# Patient Record
Sex: Male | Born: 1950 | Race: White | Hispanic: No | Marital: Married | Smoking: Former smoker
Health system: Southern US, Community
[De-identification: ages and names within clinical notes are randomized; demographics above are authoritative.]

## PROBLEM LIST (undated history)

## (undated) DIAGNOSIS — I10 Essential (primary) hypertension: Secondary | ICD-10-CM

## (undated) DIAGNOSIS — I639 Cerebral infarction, unspecified: Secondary | ICD-10-CM

## (undated) DIAGNOSIS — K219 Gastro-esophageal reflux disease without esophagitis: Secondary | ICD-10-CM

## (undated) DIAGNOSIS — T7840XA Allergy, unspecified, initial encounter: Secondary | ICD-10-CM

## (undated) DIAGNOSIS — E785 Hyperlipidemia, unspecified: Secondary | ICD-10-CM

## (undated) DIAGNOSIS — G4730 Sleep apnea, unspecified: Secondary | ICD-10-CM

## (undated) HISTORY — DX: Cerebral infarction, unspecified: I63.9

## (undated) HISTORY — DX: Sleep apnea, unspecified: G47.30

## (undated) HISTORY — DX: Essential (primary) hypertension: I10

## (undated) HISTORY — DX: Allergy, unspecified, initial encounter: T78.40XA

## (undated) HISTORY — DX: Hyperlipidemia, unspecified: E78.5

## (undated) HISTORY — PX: APPENDECTOMY: SHX54

## (undated) HISTORY — DX: Gastro-esophageal reflux disease without esophagitis: K21.9

---

## 1991-11-01 DIAGNOSIS — I639 Cerebral infarction, unspecified: Secondary | ICD-10-CM

## 1991-11-01 HISTORY — DX: Cerebral infarction, unspecified: I63.9

## 2012-05-18 ENCOUNTER — Ambulatory Visit (INDEPENDENT_AMBULATORY_CARE_PROVIDER_SITE_OTHER): Payer: 59 | Admitting: Family Medicine

## 2012-05-18 ENCOUNTER — Encounter: Payer: Self-pay | Admitting: Family Medicine

## 2012-05-18 VITALS — BP 137/70 | HR 80 | Temp 97.3°F | Resp 16 | Ht 70.0 in | Wt 273.0 lb

## 2012-05-18 DIAGNOSIS — Z720 Tobacco use: Secondary | ICD-10-CM

## 2012-05-18 DIAGNOSIS — IMO0001 Reserved for inherently not codable concepts without codable children: Secondary | ICD-10-CM

## 2012-05-18 DIAGNOSIS — E785 Hyperlipidemia, unspecified: Secondary | ICD-10-CM

## 2012-05-18 DIAGNOSIS — R21 Rash and other nonspecific skin eruption: Secondary | ICD-10-CM

## 2012-05-18 DIAGNOSIS — R609 Edema, unspecified: Secondary | ICD-10-CM

## 2012-05-18 LAB — COMPREHENSIVE METABOLIC PANEL
AST: 29 U/L (ref 0–37)
Albumin: 4.2 g/dL (ref 3.5–5.2)
Alkaline Phosphatase: 85 U/L (ref 39–117)
Chloride: 99 mEq/L (ref 96–112)
Potassium: 4.4 mEq/L (ref 3.5–5.3)
Sodium: 134 mEq/L — ABNORMAL LOW (ref 135–145)
Total Protein: 6.8 g/dL (ref 6.0–8.3)

## 2012-05-18 LAB — TSH: TSH: 1.724 u[IU]/mL (ref 0.350–4.500)

## 2012-05-18 LAB — LIPID PANEL: LDL Cholesterol: 31 mg/dL (ref 0–99)

## 2012-05-18 MED ORDER — VARDENAFIL HCL 20 MG PO TABS: 20.0000 mg | ORAL_TABLET | Freq: Every day | ORAL | Status: DC | PRN

## 2012-05-18 MED ORDER — MOMETASONE FUROATE 50 MCG/ACT NA SUSP: 2.0000 | Freq: Every day | NASAL | Status: DC

## 2012-05-18 NOTE — Patient Instructions (Addendum)

## 2012-05-19 LAB — MICROALBUMIN / CREATININE URINE RATIO
Creatinine, Urine: 54.3 mg/dL
Microalb, Ur: 0.65 mg/dL (ref 0.00–1.89)

## 2012-05-19 LAB — VITAMIN D 25 HYDROXY (VIT D DEFICIENCY, FRACTURES): Vit D, 25-Hydroxy: 33 ng/mL (ref 30–89)

## 2012-05-23 ENCOUNTER — Encounter: Payer: Self-pay | Admitting: Family Medicine

## 2012-05-23 DIAGNOSIS — E1165 Type 2 diabetes mellitus with hyperglycemia: Secondary | ICD-10-CM | POA: Insufficient documentation

## 2012-05-23 DIAGNOSIS — E785 Hyperlipidemia, unspecified: Secondary | ICD-10-CM | POA: Insufficient documentation

## 2012-05-23 DIAGNOSIS — F32A Depression, unspecified: Secondary | ICD-10-CM | POA: Insufficient documentation

## 2012-05-23 DIAGNOSIS — IMO0001 Reserved for inherently not codable concepts without codable children: Secondary | ICD-10-CM | POA: Insufficient documentation

## 2012-05-23 DIAGNOSIS — F329 Major depressive disorder, single episode, unspecified: Secondary | ICD-10-CM | POA: Insufficient documentation

## 2012-05-23 DIAGNOSIS — Z8673 Personal history of transient ischemic attack (TIA), and cerebral infarction without residual deficits: Secondary | ICD-10-CM | POA: Insufficient documentation

## 2012-05-23 DIAGNOSIS — Z720 Tobacco use: Secondary | ICD-10-CM | POA: Insufficient documentation

## 2012-05-23 NOTE — Progress Notes (Signed)
Quick Note:  Please call pt and advise that the following labs are abnormal... Your labs show an elevated blood sugar but this was expected. Your kidney function and liver tests are very good. Thyroid test (TSH) is normal.  Lipid panel show cholesterol is in good range on Atorvastatin. Triglycerides are 2 times normal. I want you to really work on improving your nutrition and following the meal plan you were given. Try to get a little exercise most days of the week. Vitamin D level is in the low-normal range; I would like to see it around 50. Get over-the-counter Vitamin D3 2000 IU and take 1 capsule daily. This will improve your metabolism and maybe help with weight loss.  Copy to pt. ______

## 2012-05-23 NOTE — Progress Notes (Signed)
Subjective:    Patient ID: Drew Salinas, male    DOB: 08/19/1951, 61 y.o.   MRN: [id number]  HPI   This 61 y.o. Cauc male is new to [facility] and is here with wife Drew Salinas) who has established care here.  He has Type II DM diagnosed in 1995 and does not follow a meal plan. He misses Insulin doses 2-4x/week  (Levemir 100 units bid, Apidra before meals); also takes Metformin. A1c= 8.4% in August 2012.   Exercise is limited.   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    He also has HTN (on Amlodipine 5 mg) and dyslipidemia (on Atorvastatin 10 mg).   He uses Levitra for ED. Sleep disorder/ depression is treated with Bupropion and Trazodone; he also   uses OTC Melatonin.    Review of Systems  Constitutional: Positive for fatigue. Negative for fever, diaphoresis, activity change and appetite change.  HENT: Positive for congestion, rhinorrhea and sneezing.        Has A.R.- takes Claritin, Mucinex- DM and uses Nasonex   Respiratory: Positive for cough. Negative for chest tightness, shortness of breath and wheezing.   Cardiovascular: Negative for chest pain, palpitations and leg swelling.  Genitourinary: Negative.   Musculoskeletal: Negative.   Neurological: Negative for dizziness, syncope, speech difficulty, weakness, light-headedness, numbness and headaches.  Psychiatric/Behavioral: Positive for disturbed wake/sleep cycle and dysphoric mood. Negative for suicidal ideas, confusion and decreased concentration. The patient is not nervous/anxious.        Objective:   Physical Exam  Nursing note and vitals reviewed. Constitutional: He is oriented to person, place, and time. He appears well-developed and well-nourished. No distress.  HENT:  Head: Normocephalic and atraumatic.  Right Ear: External ear normal.  Left Ear: External ear normal.  Nose: Nose normal.  Mouth/Throat: Oropharynx is clear and moist.  Eyes: Conjunctivae and EOM are normal.  No scleral icterus.  Neck: Normal range of motion. Neck supple. No thyromegaly present.  Cardiovascular: Normal rate, regular rhythm and normal heart sounds.  Exam reveals no gallop.   No murmur heard. Pulmonary/Chest: Effort normal and breath sounds normal. No respiratory distress. He has no wheezes.       BS distant in lower lobes  Musculoskeletal: Normal range of motion. He exhibits edema. He exhibits no tenderness.       Trace pedal/pre-tibial edema  Lymphadenopathy:    He has no cervical adenopathy.  Neurological: He is alert and oriented to person, place, and time. No cranial nerve deficit. Coordination normal.  Skin: Skin is warm and dry. Rash noted.       Right lower leg-anterior shin: erythematous slightly raised papules (uniform size), no scaliness or ulcerative lesions  Psychiatric: He has a normal mood and affect. His behavior is normal. Thought content normal.    A1C= 8.8%      Assessment & Plan:   1. Type II or unspecified type diabetes mellitus without mention of complication, uncontrolled  POCT glycosylated hemoglobin (Hb A1C) = 8.8% Microalbumin, urine, Microalbumin/Creatinine Ratio, Urine, Comprehensive metabolic panel  Pt encouraged to adhere to better nutrition; Diabetic meal planning guide given to pt and briefly reviewed with him and his wife  2. Hyperlipidemia  Comprehensive metabolic panel, Lipid panel  3. Rash - pt has been using OTC topical which has helped Continue to use topical sparingly until rash resolves  4. Edema  Vitamin D, 25-hydroxy, TSH   RFs: Nasonex NS and Levitra only meds  needed at this time. All other medications are to be continued at current doses until follow-up.

## 2012-05-24 ENCOUNTER — Encounter: Payer: Self-pay | Admitting: Family Medicine

## 2012-06-19 ENCOUNTER — Other Ambulatory Visit: Payer: Self-pay | Admitting: Family Medicine

## 2012-08-07 ENCOUNTER — Other Ambulatory Visit: Payer: Self-pay | Admitting: Family Medicine

## 2012-08-09 ENCOUNTER — Telehealth: Payer: Self-pay | Admitting: Radiology

## 2012-08-09 MED ORDER — METFORMIN HCL 500 MG PO TABS: 500.0000 mg | ORAL_TABLET | Freq: Two times a day (BID) | ORAL | Status: DC

## 2012-08-09 NOTE — Telephone Encounter (Signed)
Patients wife called about the patients medications, they have now been called in since pharmacy did not get the electronic Rx.

## 2012-08-17 ENCOUNTER — Encounter: Payer: Self-pay | Admitting: Family Medicine

## 2012-08-17 ENCOUNTER — Ambulatory Visit (INDEPENDENT_AMBULATORY_CARE_PROVIDER_SITE_OTHER): Payer: 59 | Admitting: Family Medicine

## 2012-08-17 VITALS — BP 154/66 | HR 71 | Temp 98.3°F | Resp 16 | Ht 73.0 in | Wt 271.3 lb

## 2012-08-17 DIAGNOSIS — J309 Allergic rhinitis, unspecified: Secondary | ICD-10-CM

## 2012-08-17 DIAGNOSIS — Z23 Encounter for immunization: Secondary | ICD-10-CM

## 2012-08-17 DIAGNOSIS — Z760 Encounter for issue of repeat prescription: Secondary | ICD-10-CM

## 2012-08-17 DIAGNOSIS — E781 Pure hyperglyceridemia: Secondary | ICD-10-CM

## 2012-08-17 DIAGNOSIS — IMO0001 Reserved for inherently not codable concepts without codable children: Secondary | ICD-10-CM

## 2012-08-17 LAB — LIPID PANEL: LDL Cholesterol: 62 mg/dL (ref 0–99)

## 2012-08-17 LAB — BASIC METABOLIC PANEL
BUN: 10 mg/dL (ref 6–23)
CO2: 26 mEq/L (ref 19–32)
Chloride: 105 mEq/L (ref 96–112)
Creat: 0.85 mg/dL (ref 0.50–1.35)

## 2012-08-17 MED ORDER — INSULIN GLULISINE 100 UNIT/ML ~~LOC~~ SOLN: SUBCUTANEOUS | Status: DC

## 2012-08-17 MED ORDER — TRAZODONE HCL 100 MG PO TABS: 100.0000 mg | ORAL_TABLET | Freq: Every day | ORAL | Status: DC

## 2012-08-17 MED ORDER — VARDENAFIL HCL 20 MG PO TABS: 20.0000 mg | ORAL_TABLET | Freq: Every day | ORAL | Status: DC | PRN

## 2012-08-17 MED ORDER — METFORMIN HCL 500 MG PO TABS: 500.0000 mg | ORAL_TABLET | Freq: Two times a day (BID) | ORAL | Status: DC

## 2012-08-17 MED ORDER — BUPROPION HCL ER (XL) 300 MG PO TB24: 300.0000 mg | ORAL_TABLET | Freq: Every day | ORAL | Status: DC

## 2012-08-17 MED ORDER — ATORVASTATIN CALCIUM 10 MG PO TABS: ORAL_TABLET | ORAL | Status: DC

## 2012-08-17 MED ORDER — AMLODIPINE BESYLATE 5 MG PO TABS: 5.0000 mg | ORAL_TABLET | Freq: Every day | ORAL | Status: DC

## 2012-08-17 MED ORDER — INSULIN DETEMIR 100 UNIT/ML ~~LOC~~ SOLN: 100.0000 [IU] | Freq: Two times a day (BID) | SUBCUTANEOUS | Status: DC

## 2012-08-17 MED ORDER — CLOPIDOGREL BISULFATE 75 MG PO TABS: 75.0000 mg | ORAL_TABLET | Freq: Every day | ORAL | Status: DC

## 2012-08-17 NOTE — Progress Notes (Signed)
  Subjective:    Patient ID: Drew Salinas, male    DOB: 1950/12/03, 61 y.o.   MRN: [id number]  HPI   This 61 y.o. Cauc male has Type II DM and is in Insulin; he uses a sliding scale devised by his  previous PCP in [place]. FSBS: fasting ~150 and before PM meal 160-190. He denies hypoglycemia.  He has been compliant with all medications and denies side effects.      He requests Flu vaccine but having some mild URI symptoms for 3-4 days. He is afebrile and  has no cough.      Review of Systems  Constitutional: Negative for fever, chills, diaphoresis, appetite change and fatigue.  HENT: Positive for congestion, sore throat, rhinorrhea, sneezing and postnasal drip. Negative for trouble swallowing and sinus pressure.   Eyes: Positive for redness and itching. Negative for discharge and visual disturbance.  Respiratory: Negative for cough, chest tightness and shortness of breath.   Cardiovascular: Negative for chest pain, palpitations and leg swelling.  Gastrointestinal: Negative for nausea, vomiting and diarrhea.  Neurological: Negative.        Objective:   Physical Exam  Nursing note and vitals reviewed. Constitutional: He is oriented to person, place, and time. He appears well-developed and well-nourished. No distress.  HENT:  Head: Normocephalic and atraumatic.  Right Ear: External ear normal.  Left Ear: External ear normal.  Nose: No rhinorrhea, nasal deformity or septal deviation.  Mouth/Throat: Oropharynx is clear and moist.       Nasal mucosal erythema  Eyes: Conjunctivae normal and EOM are normal. Pupils are equal, round, and reactive to light. Right eye exhibits no discharge. Left eye exhibits no discharge. No scleral icterus.  Neck: Normal range of motion. Neck supple. No thyromegaly present.  Cardiovascular: Normal rate, regular rhythm and intact distal pulses.  Exam reveals no gallop.   No murmur heard. Pulmonary/Chest: Effort normal and breath sounds normal. No respiratory  distress.  Musculoskeletal: He exhibits no edema.  Lymphadenopathy:    He has no cervical adenopathy.  Neurological: He is alert and oriented to person, place, and time. He has normal reflexes. No cranial nerve deficit.  Skin: Skin is warm and dry. Rash noted.       Bilateral feet: (Plantar aspect)- red rash with scaliness.  Psychiatric: He has a normal mood and affect. His behavior is normal.    A1C= 8.7 %      Assessment & Plan:   1. Type II or unspecified type diabetes mellitus without mention of complication, uncontrolled  Basic metabolic panel Increase Metformin to 1000 mg bid (pt to take 500 mg 2 tabs twice a day) Encouraged pt to get Insulin ASAP when finances allow  2. High triglycerides - expect improvement with DM control Lipid panel  3. Need for prophylactic vaccination and inoculation against influenza  Flu vaccine greater than or equal to 3yo preservative free IM   Letter provided for pt to return to work without restrictions.

## 2012-08-17 NOTE — Patient Instructions (Addendum)
Your need to work on better control of blood sugars. Adjust your short-acting Insulin a little and really focus on better nutrition (smaller portions, less carbs and concentrated sugars, drinking more water, etc.)   Diabetes Meal Planning Guide The diabetes meal planning guide is a tool to help you plan your meals and snacks. It is important for people with diabetes to manage their blood glucose (sugar) levels. Choosing the right foods and the right amounts throughout your day will help control your blood glucose. Eating right can even help you improve your blood pressure and reach or maintain a healthy weight. CARBOHYDRATE COUNTING MADE EASY When you eat carbohydrates, they turn to sugar. This raises your blood glucose level. Counting carbohydrates can help you control this level so you feel better. When you plan your meals by counting carbohydrates, you can have more flexibility in what you eat and balance your medicine with your food intake. Carbohydrate counting simply means adding up the total amount of carbohydrate grams in your meals and snacks. Try to eat about the same amount at each meal. Foods with carbohydrates are listed below. Each portion below is 1 carbohydrate serving or 15 grams of carbohydrates. Ask your dietician how many grams of carbohydrates you should eat at each meal or snack. Grains and Starches  1 slice bread.   English muffin or hotdog/hamburger bun.   cup cold cereal (unsweetened).   cup cooked pasta or rice.   cup starchy vegetables (corn, potatoes, peas, beans, winter squash).  1 tortilla (6 inches).   bagel.  1 waffle or pancake (size of a CD).   cup cooked cereal.  4 to 6 small crackers. *Whole grain is recommended. Fruit  1 cup fresh unsweetened berries, melon, papaya, pineapple.  1 small fresh fruit.   banana or mango.   cup fruit juice (4 oz unsweetened).   cup canned fruit in natural juice or water.  2 tbs dried fruit.  12 to 15  grapes or cherries. Milk and Yogurt  1 cup fat-free or 1% milk.  1 cup soy milk.  6 oz light yogurt with sugar-free sweetener.  6 oz low-fat soy yogurt.  6 oz plain yogurt. Vegetables  1 cup raw or  cup cooked is counted as 0 carbohydrates or a "free" food.  If you eat 3 or more servings at 1 meal, count them as 1 carbohydrate serving. Other Carbohydrates   oz chips or pretzels.   cup ice cream or frozen yogurt.   cup sherbet or sorbet.  2 inch square cake, no frosting.  1 tbs honey, sugar, jam, jelly, or syrup.  2 small cookies.  3 squares of graham crackers.  3 cups popcorn.  6 crackers.  1 cup broth-based soup.  Count 1 cup casserole or other mixed foods as 2 carbohydrate servings.  Foods with less than 20 calories in a serving may be counted as 0 carbohydrates or a "free" food. You may want to purchase a book or computer software that lists the carbohydrate gram counts of different foods. In addition, the nutrition facts panel on the labels of the foods you eat are a good source of this information. The label will tell you how big the serving size is and the total number of carbohydrate grams you will be eating per serving. Divide this number by 15 to obtain the number of carbohydrate servings in a portion. Remember, 1 carbohydrate serving equals 15 grams of carbohydrate. SERVING SIZES Measuring foods and serving sizes helps you make  sure you are getting the right amount of food. The list below tells how big or small some common serving sizes are.  1 oz.........4 stacked dice.  3 oz........Marland KitchenDeck of cards.  1 tsp.......Marland KitchenTip of little finger.  1 tbs......Marland KitchenMarland KitchenThumb.  2 tbs.......Marland KitchenGolf ball.   cup......Marland KitchenHalf of a fist.  1 cup.......Marland KitchenA fist. SAMPLE DIABETES MEAL PLAN Below is a sample meal plan that includes foods from the grain and starches, dairy, vegetable, fruit, and meat groups. A dietician can individualize a meal plan to fit your calorie needs and  tell you the number of servings needed from each food group. However, controlling the total amount of carbohydrates in your meal or snack is more important than making sure you include all of the food groups at every meal. You may interchange carbohydrate containing foods (dairy, starches, and fruits). The meal plan below is an example of a 2000 calorie diet using carbohydrate counting. This meal plan has 17 carbohydrate servings. Breakfast  1 cup oatmeal (2 carb servings).   cup light yogurt (1 carb serving).  1 cup blueberries (1 carb serving).   cup almonds. Snack  1 large apple (2 carb servings).  1 low-fat string cheese stick. Lunch  Chicken breast salad.  1 cup spinach.   cup chopped tomatoes.  2 oz chicken breast, sliced.  2 tbs low-fat [place] dressing.  12 whole-wheat crackers (2 carb servings).  12 to 15 grapes (1 carb serving).  1 cup low-fat milk (1 carb serving). Snack  1 cup carrots.   cup hummus (1 carb serving). Dinner  3 oz broiled salmon.  1 cup brown rice (3 carb servings). Snack  1  cups steamed broccoli (1 carb serving) drizzled with 1 tsp olive oil and lemon juice.  1 cup light pudding (2 carb servings). DIABETES MEAL PLANNING WORKSHEET Your dietician can use this worksheet to help you decide how many servings of foods and what types of foods are right for you.  BREAKFAST Food Group and Servings / Carb Servings Grain/Starches __________________________________ Dairy __________________________________________ Vegetable ______________________________________ Fruit ___________________________________________ Meat __________________________________________ Fat ____________________________________________ LUNCH Food Group and Servings / Carb Servings Grain/Starches ___________________________________ Dairy ___________________________________________ Fruit ____________________________________________ Meat  ___________________________________________ Fat _____________________________________________ Laural Golden Food Group and Servings / Carb Servings Grain/Starches ___________________________________ Dairy ___________________________________________ Fruit ____________________________________________ Meat ___________________________________________ Fat _____________________________________________ SNACKS Food Group and Servings / Carb Servings Grain/Starches ___________________________________ Dairy ___________________________________________ Vegetable _______________________________________ Fruit ____________________________________________ Meat ___________________________________________ Fat _____________________________________________ DAILY TOTALS Starches _________________________ Vegetable ________________________ Fruit ____________________________ Dairy ____________________________ Meat ____________________________ Fat ______________________________ Document Released: 07/14/2005 Document Revised: 01/09/2012 Document Reviewed: 05/25/2009 Exit[employer] Patient Information 2013 [place], [place].

## 2012-08-21 ENCOUNTER — Encounter: Payer: Self-pay | Admitting: Family Medicine

## 2012-08-21 NOTE — Progress Notes (Signed)
Quick Note:  Please call pt and advise that the following labs are abnormal... Blood sugar is elevated as expected and triglycerides are still about 100 points too high. This value is almost 100 points better than 3 months ago. Continue to work on nutrition and taking your medications as directed.   Contact the office if you have any questions or concerns.  Copy to pt. ______

## 2012-09-11 ENCOUNTER — Other Ambulatory Visit: Payer: Self-pay | Admitting: Physician Assistant

## 2012-11-16 ENCOUNTER — Encounter: Payer: Self-pay | Admitting: Family Medicine

## 2012-11-16 ENCOUNTER — Ambulatory Visit (INDEPENDENT_AMBULATORY_CARE_PROVIDER_SITE_OTHER): Payer: 59 | Admitting: Family Medicine

## 2012-11-16 VITALS — BP 130/80 | HR 75 | Temp 98.9°F | Resp 16 | Ht 70.5 in | Wt 267.6 lb

## 2012-11-16 DIAGNOSIS — IMO0001 Reserved for inherently not codable concepts without codable children: Secondary | ICD-10-CM

## 2012-11-16 MED ORDER — METFORMIN HCL 500 MG PO TABS: ORAL_TABLET | ORAL | Status: DC

## 2012-11-16 MED ORDER — SITAGLIPTIN PHOS-METFORMIN HCL 50-1000 MG PO TABS: 1.0000 | ORAL_TABLET | Freq: Two times a day (BID) | ORAL | Status: DC

## 2012-11-16 NOTE — Progress Notes (Addendum)
S:  This 62 y.o. Cauc male is here for DM follow-up. FSBS have been "high"- > 150. He has over-indulged during the holidays but has managed to lose weight. He feels well and denies any medication side effects. Checks feet daily and denies pruritus, pain or numbness.   ROS: Negative for fatigue, unexplained weight change, CP or tightness, palpitations, SOB or DOE, myalgias, HA, dizziness, weakness or syncope.  O:  Filed Vitals:   11/16/12 0752  BP: 130/80  Pulse: 75  Temp: 98.9 F (37.2 C)  Resp: 16   GEN: In NAD; WN,WD. HENT: [place]/AT; EOMI w/ clear conj/sclerae. Unremarkable. COR: RRR. No edema. LUNGS: Normal resp rate and effort. SKIN: W&D; Feet- scaling w/ redness; no ulcerations. Pulses- DP 2+. See DM foot exam. NEURO: A&O x 3; CNs intact. Nonfocal.  Results for orders placed in visit on 11/16/12  POCT GLYCOSYLATED HEMOGLOBIN (HGB A1C)      Component Value Range   Hemoglobin A1C 8.7       A/P:  1. Type II or unspecified type diabetes mellitus without mention of complication, uncontrolled  Encouraged pt to improve nutrition and increase physical activity. Continue Levemir and Apidra doses. Change oral medication to Janumet 50/1000 mg 1 tab bid w/ meals (can finish current supply of Metformin)   2.  Pt encouraged to continue weight loss w/ better nutrition.  Given RX: Zostavax to prevent Shingles.

## 2012-11-16 NOTE — Patient Instructions (Signed)
Medication change- New medication is Janumet (2 medications in one tablet). You will take 1 tablet twice a day with meals. Work on better nutrition and continued weight loss. Check blood sugars as you have been doing.

## 2013-02-13 ENCOUNTER — Encounter: Payer: Self-pay | Admitting: Family Medicine

## 2013-02-13 ENCOUNTER — Ambulatory Visit (INDEPENDENT_AMBULATORY_CARE_PROVIDER_SITE_OTHER): Payer: PRIVATE HEALTH INSURANCE | Admitting: Family Medicine

## 2013-02-13 ENCOUNTER — Telehealth: Payer: Self-pay

## 2013-02-13 VITALS — BP 144/70 | HR 76 | Temp 98.0°F | Resp 16 | Ht 70.5 in | Wt 270.4 lb

## 2013-02-13 DIAGNOSIS — E669 Obesity, unspecified: Secondary | ICD-10-CM

## 2013-02-13 DIAGNOSIS — G4733 Obstructive sleep apnea (adult) (pediatric): Secondary | ICD-10-CM

## 2013-02-13 DIAGNOSIS — I10 Essential (primary) hypertension: Secondary | ICD-10-CM

## 2013-02-13 DIAGNOSIS — E785 Hyperlipidemia, unspecified: Secondary | ICD-10-CM

## 2013-02-13 DIAGNOSIS — IMO0001 Reserved for inherently not codable concepts without codable children: Secondary | ICD-10-CM

## 2013-02-13 LAB — POCT GLYCOSYLATED HEMOGLOBIN (HGB A1C): Hemoglobin A1C: 8.1

## 2013-02-13 LAB — COMPREHENSIVE METABOLIC PANEL
BUN: 11 mg/dL (ref 6–23)
CO2: 24 mEq/L (ref 19–32)
Calcium: 9.2 mg/dL (ref 8.4–10.5)
Chloride: 102 mEq/L (ref 96–112)
Creat: 0.98 mg/dL (ref 0.50–1.35)
Glucose, Bld: 214 mg/dL — ABNORMAL HIGH (ref 70–99)

## 2013-02-13 LAB — LIPID PANEL
Cholesterol: 125 mg/dL (ref 0–200)
HDL: 45 mg/dL (ref >39)
Total CHOL/HDL Ratio: 2.8 Ratio

## 2013-02-13 NOTE — Patient Instructions (Addendum)
For improved Diabetes control- work on making those small changes that we discussed. I am not going to change any medications at this time. Reducing your calorie intake and staying active are key to getting your A1c down to 7%.

## 2013-02-13 NOTE — Telephone Encounter (Signed)
Patient needs a new sleep machine.  Forgot to talk with Dr. Audria Nine this morning about this.  Machine he has is five years or older and cuts off on him.   CBN:  [phone]  [phone]  Dairl Ponder phone

## 2013-02-13 NOTE — Telephone Encounter (Signed)
Pended order, sleep study done 2007, unsure if this is too old for him to get new supplies, I have faxed the old sleep study to you along with his demographics and insurance card and [facility] order. If you agree with him getting new machine sign order in computer, sign it by hand when it prints, and sign [facility] order as I have faxed it to you. Hopefully if we fax old sleep study and new order for supplies, he can get the supplies, the fax number for [employer] is [phone], if you need me to do anything else please let me know. Abriel Hattery

## 2013-02-13 NOTE — Progress Notes (Signed)
S:  This 62 y.o. Cauc male returns for DM and HTN follow-up. He has no complaints; FSBS= 100-220. Pt admits that he could  Reduce bread intake as well as chips and other snacks. He is employed full-time and is active in the job. Pt denies skipping medications doses and has no adverse effects. Family plans to travel this weekend to  [place] for family gathering and event at horse track.  HCM: No eye exam in last  12 months.  ROS: Negative for fatigue, abnormal weight change, diaphoresis, CP or tightness, palpitations, SOB or DOE, cough, HA, dizziness, numbness, weakness or syncope.  OCeasar Mons Vitals:   02/13/13 0749  BP: 144/70                                        Weight down ~1.5 pounds since Oct  2013  Pulse: 76  Temp: 98 F (36.7 C)  Resp: 16   GEN: In NAD; WN,WD. Pt is obese but also has a lot of muscle weight. HENT: [place]/AT; EOMI w/ clear conj/ sclerae. EACs/nose normal. Oroph clear and moist. COR: RRR. LUNGS: Normal resp rate and effort. SKIN: W&D. No pallor or erythema. MS; MAEs; no deformities, c/c/e. NEURO: A&O x 3; CNs intact. Nonfocal.   Results for orders placed in visit on 02/13/13  POCT GLYCOSYLATED HEMOGLOBIN (HGB A1C)      Result Value Range   Hemoglobin A1C 8.1      LABS:  October 2013    Total Chol= 157  TGs= 241  HDL= 47  LDL= 62   CHD risk=3.3   A/P: Type II or unspecified type diabetes mellitus without mention of complication, uncontrolled - discussed nutrition changes that pt needs to consider (Carbs reduction) . No medication changes at this time. Plan: POCT glycosylated hemoglobin (Hb A1C), Comprehensive metabolic panel  Other and unspecified hyperlipidemia   Plan: Lipid panel; continue current medications.  HTN (hypertension)- stable; encourage weight reduction. Continue current medications.  Obesity, unspecified- weight loss goal is 5-8 pounds by next visit.

## 2013-02-14 NOTE — Telephone Encounter (Signed)
I think pt needs a new Sleep Study,given that it has been 61/2 years since last study I am going to order a new study He was diagnosed with Moderately Severe Obstructive Sleep Apnea. Please notify pt and find out if he has any scheduling preferences. Thank you.

## 2013-02-14 NOTE — Telephone Encounter (Signed)
Called him. Spoke to his wife.

## 2013-02-14 NOTE — Progress Notes (Signed)
Quick Note:  Please contact pt and advise that the following labs are abnormal...  Labs are normal (kidney and liver tests) except blood sugar is elevated. Cholesterol profile shows steady improvement : Triglycerides down from 241 to 203. LDL down to 39. HDL still above 39. Heart disease risk is less than average. Continue current medications.  Copy to pt. ______

## 2013-02-21 ENCOUNTER — Other Ambulatory Visit: Payer: Self-pay | Admitting: Family Medicine

## 2013-03-13 ENCOUNTER — Other Ambulatory Visit: Payer: Self-pay | Admitting: Family Medicine

## 2013-03-20 ENCOUNTER — Other Ambulatory Visit: Payer: Self-pay | Admitting: Family Medicine

## 2013-04-12 ENCOUNTER — Telehealth: Payer: Self-pay

## 2013-04-12 NOTE — Telephone Encounter (Signed)
Wants to know if sleep study is back and checking on medication insulin glulisine (APIDRA) 100 UNIT/ML injection The directions do not match what he is currently taking.   [phone]

## 2013-04-13 ENCOUNTER — Telehealth: Payer: Self-pay | Admitting: Physician Assistant

## 2013-04-13 MED ORDER — INSULIN GLULISINE 100 UNIT/ML ~~LOC~~ SOCT: 20.0000 [IU] | Freq: Four times a day (QID) | SUBCUTANEOUS | Status: DC

## 2013-04-13 NOTE — Telephone Encounter (Signed)
I have re-ordered Apidra with correct instructions for use. As of today, I have not seen a report re: Sleep Study.

## 2013-04-13 NOTE — Telephone Encounter (Signed)
Pt has been using Apidra 20-30 units 4 times a day, this has not changed since his original visit. Prescription only directs 3 times a day up to 15units -  He will need his prescription corrected as he is about to run out. His CBG's have been running around 112 in the morning.    Pt had sleep study two weeks ago- would like to know results and get a new breathing machine. Have we received any reports on this?

## 2013-04-13 NOTE — Telephone Encounter (Signed)
Pharmacy called to state they have never heard of an Apidra [employer]. Ok to change to [employer]. Same sig. #5 pens. RF 3.

## 2013-04-14 NOTE — Telephone Encounter (Signed)
Pt aware insulin rx has been resent.

## 2013-04-15 ENCOUNTER — Telehealth: Payer: Self-pay | Admitting: Radiology

## 2013-04-15 NOTE — Telephone Encounter (Signed)
Would like to know results of sleep study. I contacted the office and left a message to call us back and fax results. Will forward to [facility] as [employer]

## 2013-04-16 NOTE — Telephone Encounter (Signed)
I spoke with pt's wife, Elita Quick, after reviewing the Sleep Study results.Pt has long hx of OSA and went for study because of need for new CPAP. 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 The pt is supposed to return for second study, per wife; I informed her that the study does indicate that he is supposed to return 1 month from initial study date (03/29/2013). The results have been faxed to [facility]; wife given phone numbers to follow-up on repeat study and DME. I advised that the pt take his current apparatus w/ him to follow-up study so that the sleep center can make sure that new CPAP is ordered. Wife understands and requests copy of study be sent to their home.

## 2013-04-25 ENCOUNTER — Encounter: Payer: Self-pay | Admitting: Family Medicine

## 2013-05-14 ENCOUNTER — Ambulatory Visit (INDEPENDENT_AMBULATORY_CARE_PROVIDER_SITE_OTHER): Payer: PRIVATE HEALTH INSURANCE | Admitting: Emergency Medicine

## 2013-05-14 VITALS — BP 142/82 | HR 83 | Temp 98.0°F | Resp 17 | Ht 70.5 in | Wt 270.0 lb

## 2013-05-14 DIAGNOSIS — IMO0001 Reserved for inherently not codable concepts without codable children: Secondary | ICD-10-CM

## 2013-05-14 DIAGNOSIS — B372 Candidiasis of skin and nail: Secondary | ICD-10-CM

## 2013-05-14 MED ORDER — FLUCONAZOLE 100 MG PO TABS: ORAL_TABLET | ORAL | Status: DC

## 2013-05-14 NOTE — Progress Notes (Signed)
[facility] [address], [place] [address] [phone]  Date:  05/14/2013   Name:  Drew Salinas   DOB:  [birth year]   MRN:  [id number]  PCP:  Dow Adolph, MD    Chief Complaint: Rash   History of Present Illness:  Drew Salinas is a 62 y.o. very pleasant male patient who presents with the following  Insulin dependent type 2 diabetic.  Has a rash on his trunk over the past month.  Says is pruritic.  No history of contact allergen.  Rash limited to his inframammary fold. No improvement with over the counter medications or other home remedies. Denies other complaint or health concern today.   Patient Active Problem List   Diagnosis Date Noted  . Type II or unspecified type diabetes mellitus without mention of complication, uncontrolled 05/23/2012  . Hyperlipidemia 05/23/2012  . Obesity, Class II, BMI 35-39.9, with comorbidity 05/23/2012  . Depression 05/23/2012  . Tobacco user 05/23/2012  . History of stroke without residual deficits 05/23/2012    Past Medical History  Diagnosis Date  . Hypertension   . Hyperlipidemia   . Allergy   . Stroke 1993  . Diabetes mellitus 1995    History reviewed. No pertinent past surgical history.  History  Substance Use Topics  . Smoking status: Current Every Day Smoker -- 0.50 packs/day for 45 years    Types: Cigarettes  . Smokeless tobacco: Not on file  . Alcohol Use: 1.2 oz/week    2 Cans of beer per week    History reviewed. No pertinent family history.  Allergies  Allergen Reactions  . Lisinopril Cough    Medication list has been reviewed and updated.  Current Outpatient Prescriptions on File Prior to Visit  Medication Sig Dispense Refill  . amLODipine (NORVASC) 5 MG tablet TAKE 1 TABLET BY MOUTH EVERY DAY  30 tablet  5  . atorvastatin (LIPITOR) 10 MG tablet TAKE 1 TABLET BY MOUTH EVERY EVENING AFTER A MEAL  30 tablet  5  . B-D INS SYR ULTRAFINE 1CC/30G 30G X 1/2" 1 ML MISC USE 6 TIMES A DAY  400  each  2  . buPROPion (WELLBUTRIN XL) 300 MG 24 hr tablet TAKE 1 TABLET (300 MG TOTAL) BY MOUTH DAILY.  30 tablet  5  . clopidogrel (PLAVIX) 75 MG tablet TAKE 1 TABLET (75 MG TOTAL) BY MOUTH DAILY.  30 tablet  5  . insulin detemir (LEVEMIR) 100 UNIT/ML injection Inject 100 Units into the skin 2 (two) times daily.  120 mL  5  . Insulin Glulisine (APIDRA OPTICLIK) 100 UNIT/ML SOCT Inject 20-30 Units into the skin 4 (four) times daily.  3 cartridge  5  . mometasone (NASONEX) 50 MCG/ACT nasal spray Place 2 sprays into the nose daily.  17 g  11  . sitaGLIPtan-metformin (JANUMET) 50-1000 MG per tablet Take 1 tablet by mouth 2 (two) times daily with a meal.  60 tablet  5  . traZODone (DESYREL) 100 MG tablet TAKE 1 TABLET (100 MG TOTAL) BY MOUTH AT BEDTIME.  30 tablet  2  . vardenafil (LEVITRA) 20 MG tablet Take 1 tablet (20 mg total) by mouth daily as needed.  10 tablet  1   No current facility-administered medications on file prior to visit.    Review of Systems:  As per HPI, otherwise negative.    Physical Examination: Filed Vitals:   05/14/13 1914  BP: 142/82  Pulse: 83  Temp: 98 F (36.7 C)  Resp: 17   Filed Vitals:   05/14/13 1914  Height: 5' 10.5" (1.791 m)  Weight: 270 lb (122.471 kg)   Body mass index is 38.18 kg/(m^2). Ideal Body Weight: Weight in (lb) to have BMI = 25: 176.4   GEN: obese, NAD, Non-toxic, Alert & Oriented x 3 HEENT: Atraumatic, Normocephalic.  Ears and Nose: No external deformity. EXTR: No clubbing/cyanosis/edema NEURO: Normal gait.  PSYCH: Normally interactive. Conversant. Not depressed or anxious appearing.  Calm demeanor.  SKIN:  Rash characteristic of candidal intertrigo inframammary folds  Assessment and Plan: Candidal intertrigo Diflucan Follow up as needed   Signed,  Phillips Odor, MD

## 2013-05-14 NOTE — Patient Instructions (Addendum)
Cutaneous Candidiasis Cutaneous candidiasis is a condition in which there is an overgrowth of yeast (candida) on the skin. Yeast normally live on the skin, but in small enough numbers not to cause any symptoms. In certain cases, increased growth of the yeast may cause an actual yeast infection. This kind of infection usually occurs in areas of the skin that are constantly warm and moist, such as the armpits or the groin. Yeast is the most common cause of diaper rash in babies and in people who cannot control their bowel movements (incontinence). CAUSES  The fungus that most often causes cutaneous candidiasis is Candida albicans. Conditions that can increase the risk of getting a yeast infection of the skin include:  Obesity.  Pregnancy.  Diabetes.  Taking antibiotic medicine.  Taking birth control pills.  Taking steroid medicines.  Thyroid disease.  An iron or zinc deficiency.  Problems with the immune system. SYMPTOMS   Red, swollen area of the skin.  Bumps on the skin.  Itchiness. DIAGNOSIS  The diagnosis of cutaneous candidiasis is usually based on its appearance. Light scrapings of the skin may also be taken and viewed under a microscope to identify the presence of yeast. TREATMENT  Antifungal creams may be applied to the infected skin. In severe cases, oral medicines may be needed.  HOME CARE INSTRUCTIONS   Keep your skin clean and dry.  Maintain a healthy weight.  If you have diabetes, keep your blood sugar under control. SEEK IMMEDIATE MEDICAL CARE IF:  Your rash continues to spread despite treatment.  You have a fever, chills, or abdominal pain. Document Released: 07/05/2011 Document Revised: 01/09/2012 Document Reviewed: 07/05/2011 Exit[employer] Patient Information 2014 [employer].  

## 2013-05-16 ENCOUNTER — Other Ambulatory Visit: Payer: Self-pay | Admitting: Physician Assistant

## 2013-05-30 ENCOUNTER — Other Ambulatory Visit: Payer: Self-pay | Admitting: Family Medicine

## 2013-06-04 ENCOUNTER — Other Ambulatory Visit: Payer: Self-pay | Admitting: Radiology

## 2013-06-04 MED ORDER — INSULIN GLULISINE 100 UNIT/ML IJ SOLN: INTRAMUSCULAR | Status: DC

## 2013-06-04 MED ORDER — MOMETASONE FUROATE 50 MCG/ACT NA SUSP: 2.0000 | Freq: Every day | NASAL | Status: DC

## 2013-06-04 NOTE — Telephone Encounter (Signed)
Wife called they want to go back to vials or the apidra. He is using 20-30 units. Pens are not lasting 3 weeks. Pended this please advise ( I can not send in insulin) sorry He also needs nasonex.

## 2013-06-05 ENCOUNTER — Other Ambulatory Visit: Payer: Self-pay | Admitting: Radiology

## 2013-06-05 MED ORDER — INSULIN GLULISINE 100 UNIT/ML IJ SOLN: INTRAMUSCULAR | Status: DC

## 2013-06-05 NOTE — Telephone Encounter (Signed)
One vial only lasts one week

## 2013-06-12 ENCOUNTER — Other Ambulatory Visit: Payer: Self-pay | Admitting: Physician Assistant

## 2013-07-12 ENCOUNTER — Encounter: Payer: Self-pay | Admitting: Family Medicine

## 2013-07-12 ENCOUNTER — Ambulatory Visit (INDEPENDENT_AMBULATORY_CARE_PROVIDER_SITE_OTHER): Payer: PRIVATE HEALTH INSURANCE | Admitting: Family Medicine

## 2013-07-12 VITALS — BP 156/68 | HR 74 | Temp 98.0°F | Resp 16 | Ht 70.75 in | Wt 272.2 lb

## 2013-07-12 DIAGNOSIS — Z0000 Encounter for general adult medical examination without abnormal findings: Secondary | ICD-10-CM

## 2013-07-12 DIAGNOSIS — N529 Male erectile dysfunction, unspecified: Secondary | ICD-10-CM

## 2013-07-12 DIAGNOSIS — Z760 Encounter for issue of repeat prescription: Secondary | ICD-10-CM

## 2013-07-12 DIAGNOSIS — Z23 Encounter for immunization: Secondary | ICD-10-CM

## 2013-07-12 DIAGNOSIS — IMO0001 Reserved for inherently not codable concepts without codable children: Secondary | ICD-10-CM

## 2013-07-12 DIAGNOSIS — Z1211 Encounter for screening for malignant neoplasm of colon: Secondary | ICD-10-CM

## 2013-07-12 LAB — POCT URINALYSIS DIPSTICK
Nitrite, UA: NEGATIVE
Protein, UA: NEGATIVE
Spec Grav, UA: 1.015
Urobilinogen, UA: 0.2

## 2013-07-12 LAB — IFOBT (OCCULT BLOOD): IFOBT: NEGATIVE

## 2013-07-12 LAB — GLUCOSE, POCT (MANUAL RESULT ENTRY): POC Glucose: 189 mg/dl — AB (ref 70–99)

## 2013-07-12 MED ORDER — SITAGLIPTIN PHOS-METFORMIN HCL 50-1000 MG PO TABS: ORAL_TABLET | ORAL | Status: DC

## 2013-07-12 MED ORDER — MOMETASONE FUROATE 50 MCG/ACT NA SUSP: 2.0000 | Freq: Every day | NASAL | Status: DC

## 2013-07-12 MED ORDER — INSULIN GLULISINE 100 UNIT/ML IJ SOLN: INTRAMUSCULAR | Status: DC

## 2013-07-12 MED ORDER — BUPROPION HCL ER (XL) 300 MG PO TB24: ORAL_TABLET | ORAL | Status: DC

## 2013-07-12 MED ORDER — INSULIN DETEMIR 100 UNIT/ML ~~LOC~~ SOLN: 100.0000 [IU] | Freq: Two times a day (BID) | SUBCUTANEOUS | Status: DC

## 2013-07-12 MED ORDER — CLOPIDOGREL BISULFATE 75 MG PO TABS: ORAL_TABLET | ORAL | Status: DC

## 2013-07-12 MED ORDER — ATORVASTATIN CALCIUM 10 MG PO TABS: ORAL_TABLET | ORAL | Status: DC

## 2013-07-12 MED ORDER — VARDENAFIL HCL 20 MG PO TABS: 20.0000 mg | ORAL_TABLET | Freq: Every day | ORAL | Status: DC | PRN

## 2013-07-12 MED ORDER — TRAZODONE HCL 100 MG PO TABS: ORAL_TABLET | ORAL | Status: DC

## 2013-07-12 NOTE — Patient Instructions (Addendum)
Keeping you healthy  Get these tests  Blood pressure- Have your blood pressure checked once a year by your healthcare provider.  Normal blood pressure is 120/80  Weight- Have your body mass index (BMI) calculated to screen for obesity.  BMI is a measure of body fat based on height and weight. You can also calculate your own BMI at [email or web address].  Cholesterol- Have your cholesterol checked every year.  Diabetes- Have your blood sugar checked regularly if you have high blood pressure, high cholesterol, have a family history of diabetes or if you are overweight.  Screening for Colon Cancer- Colonoscopy starting at age 60.  Screening may begin sooner depending on your family history and other health conditions. Follow up colonoscopy as directed by your Gastroenterologist.  Screening for Prostate Cancer- Both blood work (PSA) and a rectal exam help screen for Prostate Cancer.  Screening begins at age 67 with African-American men and at age 23 with Caucasian men.  Screening may begin sooner depending on your family history.  Take these medicines  Aspirin- One aspirin daily can help prevent Heart disease and Stroke.  Flu shot- Every fall.  Tetanus- Every 10 years.  Zostavax- Once after the age of 83 to prevent Shingles. You have a prescription for this vaccine. You can get it at [place] [facility] or [facility] or [facility]>  Pneumonia shot- Once after the age of 46; if you are younger than 33, ask your healthcare provider if you need a Pneumonia shot.  Take these steps  Don't smoke- If you do smoke, talk to your doctor about quitting.  For tips on how to quit, go to www.smokefree.gov or call 1-800-QUIT-NOW.  Be physically active- Exercise 5 days a week for at least 30 minutes.  If you are not already physically active start slow and gradually work up to 30 minutes of moderate physical activity.  Examples of moderate activity include walking briskly, mowing  the yard, dancing, swimming, bicycling, etc.  Eat a healthy diet- Eat a variety of healthy food such as fruits, vegetables, low fat milk, low fat cheese, yogurt, lean meant, poultry, fish, beans, tofu, etc. For more information go to www.thenutritionsource.org  Drink alcohol in moderation- Limit alcohol intake to less than two drinks a day. Never drink and drive.  Dentist- Brush and floss twice daily; visit your dentist twice a year.  Depression- Your emotional health is as important as your physical health. If you're feeling down, or losing interest in things you would normally enjoy please talk to your healthcare provider.  Eye exam- Visit your eye doctor every year.  Safe sex- If you may be exposed to a sexually transmitted infection, use a condom.  Seat belts- Seat belts can save your life; always wear one.  Smoke/Carbon Monoxide detectors- These detectors need to be installed on the appropriate level of your home.  Replace batteries at least once a year.  Skin cancer- When out in the sun, cover up and use sunscreen 15 SPF or higher.  Violence- If anyone is threatening you, please tell your healthcare provider.  Living Will/ Health care power of attorney- Speak with your healthcare provider and family.   Schedule your vision evaluation.

## 2013-07-12 NOTE — Progress Notes (Signed)
Subjective:    Patient ID: Drew Salinas, male    DOB: 12/19/1950, 62 y.o.   MRN: [id number]  HPI  This 62 y.o. Cauc male is here for CPE. He has Type II DM and recently stopped smoking.  He is compliant w/ medications and is addressing nutrition changes that are needed.   HCM is current except for vision evaluation and Zostavax immunization.  Patient Active Problem List   Diagnosis Date Noted  . Type II or unspecified type diabetes mellitus without mention of complication, uncontrolled 05/23/2012  . Hyperlipidemia 05/23/2012  . Obesity, Class II, BMI 35-39.9, with comorbidity 05/23/2012  . Depression 05/23/2012  . Tobacco user 05/23/2012  . History of stroke without residual deficits 05/23/2012   PMHx, Soc Hx and Fam Hx reviewed.  Medications reconciled.   Review of Systems  Constitutional: Negative.   HENT: Negative.   Eyes: Negative.        Overdue for Diabetic eye exam.  Respiratory: Negative.   Cardiovascular: Negative.   Gastrointestinal: Negative.   Endocrine: Negative.        Diabetes   Genitourinary: Negative.        Has Erectile Dysfunction- uses medication w/o adverse effects.  Musculoskeletal: Positive for arthralgias.       L>R knee pain- infrequent, when climbing steps. Occasional nocturnal muscle cramps, relieved by eating a banana.  Skin: Negative.        Has NT small bump near anus.  Allergic/Immunologic: Negative.   Neurological: Negative.   Hematological: Negative.   Psychiatric/Behavioral: Negative.        Objective:   Physical Exam  Nursing note and vitals reviewed. Constitutional: He is oriented to person, place, and time. He appears well-developed and well-nourished. No distress.  HENT:  Head: Normocephalic and atraumatic.  Right Ear: Hearing, external ear and ear canal normal. Tympanic membrane is scarred. Tympanic membrane is not injected. No middle ear effusion. No decreased hearing is noted.  Left Ear: Hearing, external ear and ear canal  normal. Tympanic membrane is scarred. Tympanic membrane is not injected.  No middle ear effusion. No decreased hearing is noted.  Nose: Nose normal. No nasal deformity or septal deviation. Right sinus exhibits no maxillary sinus tenderness and no frontal sinus tenderness. Left sinus exhibits no maxillary sinus tenderness and no frontal sinus tenderness.  Mouth/Throat: Uvula is midline, oropharynx is clear and moist and mucous membranes are normal. No oral lesions. Normal dentition. No dental caries.  Eyes: Conjunctivae, EOM and lids are normal. Pupils are equal, round, and reactive to light. No scleral icterus.  Fundoscopic exam:      The right eye shows red reflex.       The left eye shows red reflex.  Fundoscopic exam difficult.  Neck: Normal range of motion and full passive range of motion without pain. Neck supple. No JVD present. No spinous process tenderness and no muscular tenderness present. Carotid bruit is not present. Normal range of motion present. No mass and no thyromegaly present.  Cardiovascular: Normal rate, regular rhythm, S1 normal, S2 normal and normal pulses.   No extrasystoles are present. PMI is not displaced.  Exam reveals distant heart sounds. Exam reveals no gallop and no friction rub.   No murmur heard. Pulmonary/Chest: Effort normal and breath sounds normal. No respiratory distress.  Breath sounds distant. Barrel-shaped chest.  Abdominal: Soft. Bowel sounds are normal. He exhibits no distension, no abdominal bruit, no pulsatile midline mass and no mass. There is no hepatomegaly. There is no tenderness.  There is no guarding and no CVA tenderness. A hernia is present. Hernia confirmed positive in the ventral area.  Football size ventral hernia; NT and easily reducible.  Genitourinary: Rectum normal. Rectal exam shows no external hemorrhoid, no fissure, no mass, no tenderness and anal tone normal. Prostate is not enlarged and not tender.  Peri-anal area: NT 2- 3 mm firm white  cystic lesion.  Lymphadenopathy:       Head (right side): No submental, no submandibular, no tonsillar, no posterior auricular and no occipital adenopathy present.       Head (left side): No submental, no submandibular, no tonsillar, no posterior auricular and no occipital adenopathy present.    He has no cervical adenopathy.    He has no axillary adenopathy.       Right: No inguinal and no supraclavicular adenopathy present.       Left: No inguinal and no supraclavicular adenopathy present.  Neurological: He is alert and oriented to person, place, and time. He has normal strength. He is not disoriented. He displays no atrophy. No cranial nerve deficit or sensory deficit. He exhibits normal muscle tone. He displays a negative Romberg sign. Coordination and gait normal.  Reflex Scores:      Tricep reflexes are 1+ on the right side and 1+ on the left side.      Bicep reflexes are 1+ on the right side and 1+ on the left side.      Patellar reflexes are 1+ on the right side and 1+ on the left side. DTRs difficult to illicit.  Skin: Skin is warm, dry and intact. No ecchymosis and no rash noted. He is not diaphoretic. No cyanosis or erythema. No pallor. Nails show no clubbing.  Lower ext varicosities.  Psychiatric: He has a normal mood and affect. His speech is normal and behavior is normal. Judgment and thought content normal. Cognition and memory are normal.    Results for orders placed in visit on 07/12/13  POCT URINALYSIS DIPSTICK      Result Value Range   Color, UA yellow     Clarity, UA clear     Glucose, UA >=1000mg      Bilirubin, UA neg     Ketones, UA neg     Spec Grav, UA 1.015     Blood, UA neg     pH, UA 7.0     Protein, UA neg     Urobilinogen, UA 0.2     Nitrite, UA neg     Leukocytes, UA Negative    GLUCOSE, POCT (MANUAL RESULT ENTRY)      Result Value Range   POC Glucose 189 (*) 70 - 99 mg/dl  POCT GLYCOSYLATED HEMOGLOBIN (HGB A1C)      Result Value Range   Hemoglobin  A1C 8.1    IFOBT (OCCULT BLOOD)      Result Value Range   IFOBT Negative     ECG: NSR; no ST-TW changes. No ectopy.      Assessment & Plan:  Routine general medical examination at a health care facility - Congratulated pt on smoking cessation efforts! Plan: POCT urinalysis dipstick, T3, free, T4, free, TSH, PSA, EKG 12-Lead  Type II or unspecified type diabetes mellitus without mention of complication, uncontrolled - Stable A1c; encouraged nutrition changes and regular physical activity. No medication changes at this time.  Plan: POCT glucose (manual entry), POCT glycosylated hemoglobin (Hb A1C), Basic metabolic panel  Erectile dysfunction - Using Levitra which is effective.  Plan: Testosterone  Screening for colorectal cancer - Plan: Ambulatory referral to Gastroenterology, IFOBT POC (occult bld, rslt in office)  Need for prophylactic vaccination and inoculation against influenza - Plan: Flu Vaccine QUAD 36+ mos IM  Issue of repeat prescriptions  Meds ordered this encounter  Medications  . atorvastatin (LIPITOR) 10 MG tablet    Sig: Take 1 tablet by mouth after evening meal.    Dispense:  30 tablet    Refill:  5  . buPROPion (WELLBUTRIN XL) 300 MG 24 hr tablet    Sig: TAKE 1 TABLET (300 MG TOTAL) BY MOUTH EVERY MORNING.    Dispense:  30 tablet    Refill:  5  . clopidogrel (PLAVIX) 75 MG tablet    Sig: TAKE 1 TABLET (75 MG TOTAL) BY MOUTH DAILY.    Dispense:  30 tablet    Refill:  5  . insulin detemir (LEVEMIR) 100 UNIT/ML injection    Sig: Inject 1 mL (100 Units total) into the skin 2 (two) times daily.    Dispense:  120 mL    Refill:  5  . insulin glulisine (APIDRA) 100 UNIT/ML injection    Sig: 20-30 units qid    Dispense:  40 mL    Refill:  5  . sitaGLIPtin-metformin (JANUMET) 50-1000 MG per tablet    Sig: TAKE 1 TABLET BY MOUTH 2 (TWO) TIMES DAILY WITH A MEAL.    Dispense:  60 tablet    Refill:  5  . mometasone (NASONEX) 50 MCG/ACT nasal spray    Sig: Place 2  sprays into the nose daily.    Dispense:  17 g    Refill:  5  . traZODone (DESYREL) 100 MG tablet    Sig: TAKE 1 TABLET BY MOUTH AT BEDTIME    Dispense:  30 tablet    Refill:  5  . vardenafil (LEVITRA) 20 MG tablet    Sig: Take 1 tablet (20 mg total) by mouth daily as needed.    Dispense:  5 tablet    Refill:  3

## 2013-07-13 LAB — TESTOSTERONE: Testosterone: 197 ng/dL — ABNORMAL LOW (ref 300–890)

## 2013-07-13 LAB — BASIC METABOLIC PANEL
Glucose, Bld: 179 mg/dL — ABNORMAL HIGH (ref 70–99)
Potassium: 4.2 mEq/L (ref 3.5–5.3)
Sodium: 138 mEq/L (ref 135–145)

## 2013-07-16 NOTE — Progress Notes (Signed)
Quick Note:  Please advise pt regarding following labs...  All labs are normal except Testosterone is below normal. If patient desires further evaluation, a referral will be made to a Endocrine specialist.  Copy of labs to pt. (MyChart password was changed according to wife). ______

## 2013-07-17 ENCOUNTER — Other Ambulatory Visit: Payer: Self-pay | Admitting: Family Medicine

## 2013-07-17 DIAGNOSIS — R7989 Other specified abnormal findings of blood chemistry: Secondary | ICD-10-CM

## 2013-07-25 ENCOUNTER — Encounter: Payer: Self-pay | Admitting: Physician Assistant

## 2013-07-26 ENCOUNTER — Encounter: Payer: Self-pay | Admitting: Endocrinology

## 2013-07-26 ENCOUNTER — Other Ambulatory Visit: Payer: Self-pay | Admitting: *Deleted

## 2013-07-26 ENCOUNTER — Ambulatory Visit (INDEPENDENT_AMBULATORY_CARE_PROVIDER_SITE_OTHER): Payer: PRIVATE HEALTH INSURANCE | Admitting: Endocrinology

## 2013-07-26 VITALS — BP 128/54 | HR 77 | Temp 98.6°F | Resp 12 | Ht 71.0 in | Wt 274.2 lb

## 2013-07-26 DIAGNOSIS — E291 Testicular hypofunction: Secondary | ICD-10-CM

## 2013-07-26 MED ORDER — AVANAFIL 200 MG PO TABS: 200.0000 mg | ORAL_TABLET | ORAL | Status: DC | PRN

## 2013-07-26 NOTE — Progress Notes (Signed)
Patient ID: Drew Salinas, male   DOB: Dec 30, 1950, 62 y.o.   MRN: [id number]  Reason for consultation: Low testosterone  History of Present Illness  Patient has had difficulty with erectile dysfunction the last 10 years. More recently has not had any erections at all. He said that recently he has noticed some nocturnal erections however He has been using Levitra, half of a 20 mg tablet with good results but he is complaining about the cost of this medication, currently he has to pay out of pocket about $100 for 3 tablets  He thinks his libido has been fairly good and also did not complain of any unusual fatigue, poor muscle endurance,  lack of motivation or depression He does have a history of mumps during his childhood with testicular swelling He denies the following: Height loss, low trauma fracture, low bone mineral density Hot flushes, sweats, breast enlargement, long term steroid use, history of testicular injury,  He had a screening testosterone level done which was 197 fasting. No other labs have been done  DIABETES: He has had this for 15 years. Has had difficulty losing weight. Currently he does not have any exercise regimen and does not follow a meal plan His overall control appears poor with A1c 8.1%. On a regimen of Janumet along with Apidra and Levemir       Medication List       This list is accurate as of: 07/26/13  3:52 PM.  Always use your most recent med list.               amLODipine 5 MG tablet  Commonly known as:  NORVASC  TAKE 1 TABLET BY MOUTH EVERY DAY     atorvastatin 10 MG tablet  Commonly known as:  LIPITOR  Take 1 tablet by mouth after evening meal.     B-D INS SYR ULTRAFINE 1CC/30G 30G X 1/2" 1 ML Misc  Generic drug:  Insulin Syringe-Needle U-100  USE 6 TIMES A DAY AS DIRECTED     buPROPion 300 MG 24 hr tablet  Commonly known as:  WELLBUTRIN XL  TAKE 1 TABLET (300 MG TOTAL) BY MOUTH EVERY MORNING.     clopidogrel 75 MG tablet  Commonly  known as:  PLAVIX  TAKE 1 TABLET (75 MG TOTAL) BY MOUTH DAILY.     insulin detemir 100 UNIT/ML injection  Commonly known as:  LEVEMIR  Inject 1 mL (100 Units total) into the skin 2 (two) times daily.     insulin glulisine 100 UNIT/ML injection  Commonly known as:  APIDRA  20-30 units qid     mometasone 50 MCG/ACT nasal spray  Commonly known as:  NASONEX  Place 2 sprays into the nose daily.     sitaGLIPtin-metformin 50-1000 MG per tablet  Commonly known as:  JANUMET  TAKE 1 TABLET BY MOUTH 2 (TWO) TIMES DAILY WITH A MEAL.     traZODone 100 MG tablet  Commonly known as:  DESYREL  TAKE 1 TABLET BY MOUTH AT BEDTIME     vardenafil 20 MG tablet  Commonly known as:  LEVITRA  Take 1 tablet (20 mg total) by mouth daily as needed.        Allergies:  Allergies  Allergen Reactions  . Lisinopril Cough    Past Medical History  Diagnosis Date  . Hypertension   . Hyperlipidemia   . Allergy   . Stroke 1993  . Diabetes mellitus 1995    Past Surgical History  Procedure Laterality Date  .  Appendectomy      Family History  Problem Relation Age of Onset  . Diabetes Mother   . Diabetes Brother     Social History:  reports that he has quit smoking. His smoking use included Cigarettes. He has a 22.5 pack-year smoking history. He does not have any smokeless tobacco history on file. He reports that he drinks about 1.2 ounces of alcohol per week. He reports that he does not use illicit drugs.  REVIEW of systems:  Has history of allergic rhinitis He has history of hyperlipidemia treated with Lipitor History of hypertension treated with amlodipine  No numbness or tingling in his feet Does not think he has a history of depression and had been prescribed Wellbutrin to help quit smoking Also has history of insomnia treated with trazodone Taking Plavix for history of stroke with no residual deficit  LABS:    Lab Results  Component Value Date   HGBA1C 8.1 07/12/2013    General  Examination:   GENERAL APPEARANCE has significant generalized obesity.  SKIN:normal, no rash or pigmentation.  HEENT:Oral mucosa normal.  EYES:normal external appearance of eyes.   NECK:no lymphadenopathy, no thyromegaly.  LUNGS:clear to auscultation bilaterally, no wheezes, rhonchi, rales.  HEART:normal S1 And S2, no S3, S4, murmur or click.  ABDOMEN:no hepatosplenomegaly, no masses palpated, soft and not tender.   MALE GENITOURINARY:left testicle about 3.5 cm and right testicle about 3.0 cm: Normal texture .   MUSCULOSKELETALNo enlargement or deformity of joints.  EXTREMITIES:no clubbing, no edema.  NEUROLOGIC EXAM: Biceps and ankle reflexes absent bilaterally. 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 He has had only a single total testosterone level done and this will need to be repeated to confirm Will check free testosterone for better accuracy compared to total testosterone since he is significantly obese Will also check LH and prolactin levels to evaluate etiology further  Since his main symptoms are related to erectile dysfunction do not think that he will benefit subjectively from testosterone supplementation Discussed different causes of testosterone deficiency, symptoms and potential benefits and risks Also discussed that ED is usually not caused by testosterone deficiency Also discussed that he may have improved testosterone levels with significant weight loss which he has not been  trying to achieve  DIABETES: Appears to be poorly controlled with A1c 8.1. Discussed that he needs to do a regular exercise program and recommended consultation with dietitian. Also may benefit from additional therapy like Invokana which would also help with weight loss. Currently not appearing to be benefiting from [place]. Given him literature on Invokana and he would discuss this further with his PCP who is managing his diabetes

## 2013-07-27 LAB — PROLACTIN: Prolactin: 5 ng/mL (ref 2.1–17.1)

## 2013-07-29 ENCOUNTER — Encounter: Payer: Self-pay | Admitting: Physician Assistant

## 2013-07-29 ENCOUNTER — Ambulatory Visit (INDEPENDENT_AMBULATORY_CARE_PROVIDER_SITE_OTHER): Payer: PRIVATE HEALTH INSURANCE | Admitting: Physician Assistant

## 2013-07-29 VITALS — BP 138/70 | HR 82 | Ht 71.0 in | Wt 269.0 lb

## 2013-07-29 DIAGNOSIS — D689 Coagulation defect, unspecified: Secondary | ICD-10-CM

## 2013-07-29 DIAGNOSIS — Z1211 Encounter for screening for malignant neoplasm of colon: Secondary | ICD-10-CM

## 2013-07-29 LAB — TESTOSTERONE, FREE, TOTAL, SHBG
Sex Hormone Binding: 22 nmol/L (ref 13–71)
Testosterone-% Free: 2.3 % (ref 1.6–2.9)
Testosterone: 151 ng/dL — ABNORMAL LOW (ref 300–890)

## 2013-07-29 MED ORDER — MOVIPREP 100 G PO SOLR: 1.0000 | Freq: Once | ORAL | Status: DC

## 2013-07-29 NOTE — Progress Notes (Signed)
Reviewed and agree with management plan.  Clifton Safley T. Remi Lopata, MD FACG 

## 2013-07-29 NOTE — Progress Notes (Signed)
Subjective:    Patient ID: Drew Salinas, male    DOB: [birth year], 62 y.o.   MRN: [id number]  HPI Lejend is a pleasant 62 year old white male new to GI today referred by Dr. Dow Adolph for colon neoplasia screening. Patient relates prior colonoscopy and endoscopy in 2003 done in [place]. He states the colonoscopy was negative and he was told he was " good" for 10 years. 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 He has been maintained on Plavix since that time. He says he has come off Plavix several times for  procedures etc. ,and has done fine. He currently has no GI complaints, specifically no abdominal pain changes in bowel habits melena or hematochezia. He does have chronic GERD and is maintained on Nexium. No dysphagia or odynophagia.   Review of Systems  Constitutional: Negative.   HENT: Negative.   Eyes: Negative.   Respiratory: Negative.   Cardiovascular: Negative.   Gastrointestinal: Negative.   Endocrine: Negative.   Genitourinary: Negative.   Musculoskeletal: Negative.   Skin: Negative.   Allergic/Immunologic: Negative.   Neurological: Negative.   Hematological: Negative.   Psychiatric/Behavioral: Negative.    Outpatient Prescriptions Prior to Visit  Medication Sig Dispense Refill  . amLODipine (NORVASC) 5 MG tablet TAKE 1 TABLET BY MOUTH EVERY DAY  30 tablet  5  . atorvastatin (LIPITOR) 10 MG tablet Take 1 tablet by mouth after evening meal.  30 tablet  5  . B-D INS SYR ULTRAFINE 1CC/30G 30G X 1/2" 1 ML MISC USE 6 TIMES A DAY AS DIRECTED  600 each  3  . buPROPion (WELLBUTRIN XL) 300 MG 24 hr tablet TAKE 1 TABLET (300 MG TOTAL) BY MOUTH EVERY MORNING.  30 tablet  5  . clopidogrel (PLAVIX) 75 MG tablet TAKE 1 TABLET (75 MG TOTAL) BY MOUTH DAILY.  30 tablet  5  . insulin detemir (LEVEMIR) 100 UNIT/ML injection Inject 1 mL (100 Units total) into the skin 2 (two) times daily.   120 mL  5  . insulin glulisine (APIDRA) 100 UNIT/ML injection 20-30 units qid  40 mL  5  . mometasone (NASONEX) 50 MCG/ACT nasal spray Place 2 sprays into the nose daily.  17 g  5  . sitaGLIPtin-metformin (JANUMET) 50-1000 MG per tablet TAKE 1 TABLET BY MOUTH 2 (TWO) TIMES DAILY WITH A MEAL.  60 tablet  5  . traZODone (DESYREL) 100 MG tablet TAKE 1 TABLET BY MOUTH AT BEDTIME  30 tablet  5  . vardenafil (LEVITRA) 20 MG tablet Take 1 tablet (20 mg total) by mouth daily as needed.  5 tablet  3  . Avanafil (STENDRA) 200 MG TABS Take 200 mg by mouth as needed.  3 tablet  0  . Avanafil (STENDRA) 200 MG TABS Take 200 mg by mouth as needed.  6 tablet  0   No facility-administered medications prior to visit.   Allergies  Allergen Reactions  . Lisinopril Cough       Patient Active Problem List   Diagnosis Date Noted  . Type II or unspecified type diabetes mellitus without mention of complication, uncontrolled 05/23/2012  . Hyperlipidemia 05/23/2012  . Obesity, Class II, BMI 35-39.9, with comorbidity 05/23/2012  . Depression 05/23/2012  . Tobacco user 05/23/2012  . History of stroke without residual deficits 05/23/2012   History  Substance Use Topics  . Smoking status: Former Smoker -- 0.50 packs/day for 45 years  Types: Cigarettes  . Smokeless tobacco: Never Used     Comment: quit on 07/05/2013  . Alcohol Use: 1.2 oz/week    2 Cans of beer per week   family history includes Diabetes in his brother and mother.  Objective:   Physical Exam  Well-developed white male in no acute distress, pleasant blood pressure 138/70 pulse 82 height 5 foot 11 weight 269. HEENT; nontraumatic normocephalic EOMI PERRLA sclera anicteric, Supple; no JVD, Cardiovascular; regular rate and rhythm with S1-S2 no murmur or gallop, Pulmonary; clear bilaterally, Abdomen; soft nontender, nondistended, bowel sounds are active there is no palpable mass or hepatosplenomegaly he does have an appendectomy scar, Rectal ;exam  not done, Extremities; no clubbing cyanosis or edema skin warm and dry, Psych; mood and affect appropriate        Assessment & Plan:  #41 62 year old male due for colon neoplasia surveillance-negative colonoscopy 2003 per patient report done in [place] #2 chronic antiplatelet therapy with Plavix #3 history of small CVA versus RIND-at age 62, maintained on Plavix no events since #4 chronic GERD-well controlled on Nexium-reportedly negative EGD 2003  Plan; we will obtain copies of his prior procedures Schedule for colonoscopy with Dr. Jalene Mullet was discussed in detail with the patient and he is agreeable to proceed Will need to hold Plavix for 5 days prior to the procedure and will obtain consent from Dr. Audria Nine. Relative risk benefit of holding antiplatelet therapy discussed as well.

## 2013-07-29 NOTE — Patient Instructions (Addendum)
You have been scheduled for a colonoscopy with propofol. Please follow written instructions given to you at your visit today.  Please pick up your prep kit at the pharmacy within the next 1-3 days. [facility]. If you use inhalers (even only as needed), please bring them with you on the day of your procedure. Your physician has requested that you go to www.startemmi.com and enter the access code given to you at your visit today. This web site gives a general overview about your procedure. However, you should still follow specific instructions given to you by our office regarding your preparation for the procedure.

## 2013-07-30 ENCOUNTER — Other Ambulatory Visit: Payer: Self-pay | Admitting: *Deleted

## 2013-07-30 MED ORDER — TESTOSTERONE 20.25 MG/1.25GM (1.62%) TD GEL: 1.0000 | Freq: Every day | TRANSDERMAL | Status: DC

## 2013-07-30 MED ORDER — TESTOSTERONE 4 MG/24HR TD PT24: 1.0000 | MEDICATED_PATCH | Freq: Every day | TRANSDERMAL | Status: DC

## 2013-08-15 ENCOUNTER — Encounter: Payer: Self-pay | Admitting: Family Medicine

## 2013-08-19 ENCOUNTER — Telehealth: Payer: Self-pay | Admitting: *Deleted

## 2013-08-19 NOTE — Telephone Encounter (Signed)
Called the patient to advise Dr. Dorena Cookey responded to our Plavix letter and said he can hold the Plavix for 5 days prior to the procedure date of 09-06-2013. He can stop it on 09-01-2013 and resume on 09-07-2013.  He verbally understood the directions and thanked [place] for letting him know.

## 2013-09-04 ENCOUNTER — Telehealth: Payer: Self-pay | Admitting: Gastroenterology

## 2013-09-04 NOTE — Telephone Encounter (Signed)
Instructions for oral, short-acting, and long-acting insulin given over phone.

## 2013-09-06 ENCOUNTER — Encounter: Payer: Self-pay | Admitting: Gastroenterology

## 2013-09-06 ENCOUNTER — Ambulatory Visit (AMBULATORY_SURGERY_CENTER): Payer: PRIVATE HEALTH INSURANCE | Admitting: Gastroenterology

## 2013-09-06 VITALS — BP 160/75 | HR 57 | Temp 95.9°F | Resp 15 | Ht 71.0 in | Wt 269.0 lb

## 2013-09-06 DIAGNOSIS — Z1211 Encounter for screening for malignant neoplasm of colon: Secondary | ICD-10-CM

## 2013-09-06 MED ORDER — SODIUM CHLORIDE 0.9 % IV SOLN: 500.0000 mL | INTRAVENOUS | Status: DC

## 2013-09-06 NOTE — Progress Notes (Signed)
Patient did not have preoperative order for IV antibiotic SSI prophylaxis. (G8918)  Patient did not experience any of the following events: a burn prior to discharge; a fall within the facility; wrong site/side/patient/procedure/implant event; or a hospital transfer or hospital admission upon discharge from the facility. (G8907)  

## 2013-09-06 NOTE — Progress Notes (Signed)
Report to pacu rn, vss, bbs=clear 

## 2013-09-06 NOTE — Op Note (Addendum)
[facility] [address] [place], [address]   COLONOSCOPY PROCEDURE REPORT  PATIENT: Drew Salinas, Drew Salinas  MR#: [id number] BIRTHDATE: 1951-09-08 , 62  yrs. old GENDER: Male ENDOSCOPIST: Meryl Dare, MD, [facility] REFERRED XB:[id number] McPherson, M.D. PROCEDURE DATE:  09/06/2013 PROCEDURE:   Colonoscopy, screening First Screening Colonoscopy - Avg.  risk and is 50 yrs.  old or older - No.  Prior Negative Screening - Now for repeat screening. 10 or more years since last screening  History of Adenoma - Now for follow-up colonoscopy & has been > or = to 3 yrs.  N/A  Polyps Removed Today? No.  Recommend repeat exam, <10 yrs? No. ASA CLASS:   Class II INDICATIONS:average risk screening. MEDICATIONS: MAC sedation, administered by CRNA and propofol (Diprivan) 220mg  IV DESCRIPTION OF PROCEDURE:   After the risks benefits and alternatives of the procedure were thoroughly explained, informed consent was obtained.  A digital rectal exam revealed no abnormalities of the rectum.   The LB [id number]  endoscope was introduced through the anus and advanced to the cecum, which was identified by both the appendix and ileocecal valve. No adverse events experienced.   The quality of the prep was good, using MoviPrep  The instrument was then slowly withdrawn as the colon was fully examined.  COLON FINDINGS: Moderate diverticulosis was noted in the sigmoid colon.   The colon was otherwise normal.  There was no diverticulosis, inflammation, polyps or cancers unless previously stated.  Retroflexed views revealed no abnormalities. The time to cecum=2 minutes 10 seconds.  Withdrawal time=10 minutes 04 seconds. The scope was withdrawn and the procedure completed.  COMPLICATIONS: There were no complications.  ENDOSCOPIC IMPRESSION: 1.   Moderate diverticulosis was noted in the sigmoid colon 2.   The colon was otherwise normal  RECOMMENDATIONS: 1.  High fiber diet with liberal fluid  intake. 2.  You should continue to follow colorectal cancer screening guidelines for "routine risk" patients with a repeat colonoscopy in 10 years.  There is no need for screening FOBT (stool) testing for at least 5 years. 3.   Resume Plavix today  eSigned:  Meryl Dare, MD, [facility] 09/06/2013 4:14 PM Revised: 09/06/2013 4:14 PM

## 2013-09-06 NOTE — Patient Instructions (Signed)
YOU HAD AN ENDOSCOPIC PROCEDURE TODAY AT THE [facility]: Refer to the procedure report that was given to you for any specific questions about what was found during the examination.  If the procedure report does not answer your questions, please call your gastroenterologist to clarify.  If you requested that your care partner not be given the details of your procedure findings, then the procedure report has been included in a sealed envelope for you to review at your convenience later.  YOU SHOULD EXPECT: Some feelings of bloating in the abdomen. Passage of more gas than usual.  Walking can help get rid of the air that was put into your GI tract during the procedure and reduce the bloating. If you had a lower endoscopy (such as a colonoscopy or flexible sigmoidoscopy) you may notice spotting of blood in your stool or on the toilet paper. If you underwent a bowel prep for your procedure, then you may not have a normal bowel movement for a few days.  DIET: Your first meal following the procedure should be a light meal and then it is ok to progress to your normal diet.  A half-sandwich or bowl of soup is an example of a good first meal.  Heavy or fried foods are harder to digest and may make you feel nauseous or bloated.  Likewise meals heavy in dairy and vegetables can cause extra gas to form and this can also increase the bloating.  Drink plenty of fluids but you should avoid alcoholic beverages for 24 hours.  ACTIVITY: Your care partner should take you home directly after the procedure.  You should plan to take it easy, moving slowly for the rest of the day.  You can resume normal activity the day after the procedure however you should NOT DRIVE or use heavy machinery for 24 hours (because of the sedation medicines used during the test).    SYMPTOMS TO REPORT IMMEDIATELY: A gastroenterologist can be reached at any hour.  During normal business hours, 8:30 AM to 5:00 PM Monday through Friday,  call [phone].  After hours and on weekends, please call the GI answering service at [phone] who will take a message and have the physician on call contact you.   Following lower endoscopy (colonoscopy or flexible sigmoidoscopy):  Excessive amounts of blood in the stool  Significant tenderness or worsening of abdominal pains  Swelling of the abdomen that is new, acute  Fever of 100F or higher  FOLLOW UP: If any biopsies were taken you will be contacted by phone or by letter within the next 1-3 weeks.  Call your gastroenterologist if you have not heard about the biopsies in 3 weeks.  Our staff will call the home number listed on your records the next business day following your procedure to check on you and address any questions or concerns that you may have at that time regarding the information given to you following your procedure. This is a courtesy call and so if there is no answer at the home number and we have not heard from you through the emergency physician on call, we will assume that you have returned to your regular daily activities without incident.  SIGNATURES/CONFIDENTIALITY: You and/or your care partner have signed paperwork which will be entered into your electronic medical record.  These signatures attest to the fact that that the information above on your After Visit Summary has been reviewed and is understood.  Full responsibility of the confidentiality of this   discharge information lies with you and/or your care-partner.  Recommendations See procedure report  

## 2013-09-08 ENCOUNTER — Other Ambulatory Visit: Payer: Self-pay | Admitting: Physician Assistant

## 2013-09-09 ENCOUNTER — Telehealth: Payer: Self-pay | Admitting: *Deleted

## 2013-09-09 NOTE — Telephone Encounter (Signed)
  Follow up Call-  Call back number 09/06/2013  Post procedure Call Back phone  # [phone]  Permission to leave phone message Yes    No answer,left message.

## 2013-09-16 ENCOUNTER — Ambulatory Visit: Payer: PRIVATE HEALTH INSURANCE | Admitting: Endocrinology

## 2013-09-19 ENCOUNTER — Ambulatory Visit: Payer: PRIVATE HEALTH INSURANCE | Admitting: Endocrinology

## 2013-10-02 ENCOUNTER — Encounter: Payer: Self-pay | Admitting: Endocrinology

## 2013-10-02 ENCOUNTER — Ambulatory Visit (INDEPENDENT_AMBULATORY_CARE_PROVIDER_SITE_OTHER): Payer: PRIVATE HEALTH INSURANCE | Admitting: Endocrinology

## 2013-10-02 VITALS — BP 134/78 | HR 76 | Temp 98.6°F | Resp 12 | Ht 71.0 in | Wt 268.3 lb

## 2013-10-02 DIAGNOSIS — E291 Testicular hypofunction: Secondary | ICD-10-CM

## 2013-10-02 DIAGNOSIS — F3289 Other specified depressive episodes: Secondary | ICD-10-CM

## 2013-10-02 DIAGNOSIS — N529 Male erectile dysfunction, unspecified: Secondary | ICD-10-CM

## 2013-10-02 DIAGNOSIS — F32A Depression, unspecified: Secondary | ICD-10-CM

## 2013-10-02 DIAGNOSIS — F329 Major depressive disorder, single episode, unspecified: Secondary | ICD-10-CM

## 2013-10-02 DIAGNOSIS — IMO0001 Reserved for inherently not codable concepts without codable children: Secondary | ICD-10-CM

## 2013-10-02 MED ORDER — SILDENAFIL CITRATE 20 MG PO TABS: 20.0000 mg | ORAL_TABLET | ORAL | Status: DC

## 2013-10-02 MED ORDER — TESTOSTERONE 20.25 MG/1.25GM (1.62%) TD GEL: 50.5000 mg | Freq: Once | TRANSDERMAL | Status: DC

## 2013-10-02 NOTE — Progress Notes (Signed)
Patient ID: Drew Salinas, male   DOB: 06-24-1951, 62 y.o.   MRN: [id number]  Chief complaint: Low testosterone, followup  History of Present Illness:  He was seen in consultation  In 9/14 for evaluation of a low testosterone level At that time his libido had been fairly good and also did not complain of any unusual fatigue, poor muscle endurance,  lack of motivation or depression Evaluation confirmed a low free testosterone level as well as normal prolactin. LH level was mildly increased Because of documented hypogonadism he was started on Androderm 4 mg patch which she has been using However he is getting some skin irritation with the patch and on one occasion the skin on his left upper arm peeled off Because of this he has used the patch only from morning till bedtime He is also asking about a nonpruritic rash on his upper chest recently  Subjectively however he thinks he has had more energy level and mood is improved. Overall feels better. Has mild improvement in erectile dysfunction also  ED: He was given Jerral Ralph to use because of excessive cost of Levitra which was helping him but this does not appear to be working as effectively even with the full tablet. He is still interested in an alternative medication      Medication List       This list is accurate as of: 10/02/13  3:59 PM.  Always use your most recent med list.               amLODipine 5 MG tablet  Commonly known as:  NORVASC  TAKE 1 TABLET BY MOUTH EVERY DAY     atorvastatin 10 MG tablet  Commonly known as:  LIPITOR  Take 1 tablet by mouth after evening meal.     B-D INS SYR ULTRAFINE 1CC/30G 30G X 1/2" 1 ML Misc  Generic drug:  Insulin Syringe-Needle U-100  USE 6 TIMES A DAY AS DIRECTED     buPROPion 300 MG 24 hr tablet  Commonly known as:  WELLBUTRIN XL  TAKE 1 TABLET (300 MG TOTAL) BY MOUTH EVERY MORNING.     clopidogrel 75 MG tablet  Commonly known as:  PLAVIX  TAKE 1 TABLET (75 MG TOTAL) BY MOUTH DAILY.      insulin detemir 100 UNIT/ML injection  Commonly known as:  LEVEMIR  Inject 1 mL (100 Units total) into the skin 2 (two) times daily.     insulin glulisine 100 UNIT/ML injection  Commonly known as:  APIDRA  20-30 units qid     mometasone 50 MCG/ACT nasal spray  Commonly known as:  NASONEX  Place 2 sprays into the nose daily.     sitaGLIPtin-metformin 50-1000 MG per tablet  Commonly known as:  JANUMET  TAKE 1 TABLET BY MOUTH 2 (TWO) TIMES DAILY WITH A MEAL.     testosterone 4 MG/24HR Pt24 patch  Commonly known as:  ANDRODERM  Place 1 patch onto the skin daily.     traZODone 100 MG tablet  Commonly known as:  DESYREL  TAKE 1 TABLET BY MOUTH AT BEDTIME     vardenafil 20 MG tablet  Commonly known as:  LEVITRA  Take 1 tablet (20 mg total) by mouth daily as needed.        Allergies:  Allergies  Allergen Reactions  . Lisinopril Cough    Past Medical History  Diagnosis Date  . Hypertension   . Hyperlipidemia   . Allergy   . Stroke 1993  .  Diabetes mellitus 1995  . GERD (gastroesophageal reflux disease)     Past Surgical History  Procedure Laterality Date  . Appendectomy      Family History  Problem Relation Age of Onset  . Diabetes Mother   . Diabetes Brother     Social History:  reports that he quit smoking about 2 months ago. His smoking use included Cigarettes. He has a 22.5 pack-year smoking history. He has never used smokeless tobacco. He reports that he drinks about 1.2 ounces of alcohol per week. He reports that he does not use illicit drugs.  REVIEW of systems:  Obesity:  Wt Readings from Last 3 Encounters:  10/02/13 268 lb 4.8 oz (121.7 kg)  09/06/13 269 lb (122.018 kg)  07/29/13 269 lb (122.018 kg)   He has history of hyperlipidemia treated with Lipitor History of hypertension treated with amlodipine  Does not think he has a history of depression and had been prescribed Wellbutrin to help quit smoking Also has history of insomnia treated  with trazodone Taking Plavix for history of stroke with no residual deficit  LABS:   No visits with results within 2 Month(s) from this visit. Latest known visit with results is:  Office Visit on 07/26/2013  Component Date Value Range Status  . Prolactin 07/26/2013 5.0  2.1 - 17.1 ng/mL Final   Comment:      Reference Ranges:                                           Male:                       2.1 -  17.1 ng/ml                                           Male:   Pregnant          9.7 - 208.5 ng/mL                                                     Non Pregnant      2.8 -  29.2 ng/mL                                                     Post Menopausal   1.8 -  20.3 ng/mL                                              . LH 07/26/2013 9.46* 1.50 - 9.30 mIU/mL Final   Comment: Male Reference Range:20-70 yrs     1.5-9.3 mIU/mL>70 yrs       3.1-35.6 mIU/mLFemale Reference Range:Follicular Phase     1.9-12.5 mIU/mLMidcycle             8.7-76.3 mIU/mLLuteal Phase  0.5-16.9 mIU/mL  Post Menopausal      15.9-54.0                           mIU/mLPregnant             <1.5 mIU/mLContraceptives       0.7-5.6 mIU/mL  . Testosterone 07/26/2013 151* 300 - 890 ng/dL Final   Comment:           Tanner Stage       Male              Male                                        I              < 30 ng/dL        < 10 ng/dL                                        II             < 150 ng/dL       < 30 ng/dL                                        III            100-320 ng/dL     < 35 ng/dL                                        IV             200-970 ng/dL     11-91 ng/dL                                        V/Adult        300-890 ng/dL     47-82 ng/dL                             . Sex Hormone Binding 07/26/2013 22  13 - 71 nmol/L Final  . Testosterone, Free 07/26/2013 35.0* 47.0 - 244.0 pg/mL Final   Comment:                            The concentration of free testosterone is derived from a mathematical                           expression based on constants for the binding of testosterone to sex                          hormone-binding globulin and albumin.  . 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, likely to be 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 but also appears to have a slightly high FSH level Appears to had subjective improvement with using the Androderm since his last visit However he is not using the patch overnight as he thinks it causes skin irritation and peeling Also not clear if he is having a acneiform rash on his chest from testosterone supplement  Will need to check his testosterone level today to assess improvement in level with current regimen Also will give him a trial of AndroGel 1.62%, one pump on each arm which will provide more consistent 24-hour coverage as well as avoid the rash with the patch Emphasized the need for weight loss for improvement in his insulin sensitivity  ERECTILE DYSFUNCTION: Discussed that this is not related to testosterone deficiency and more related to his diabetic neuropathy/vascular disease. He still wants to try medications for this and since he cannot afford Levitra which had been effective he will try 20 mg sildenafil tablets, up to 100 mg dose at that time. Printed prescription given  DIABETES: Appears to be poorly controlled with significantly high fasting readings. Last A1c 8.1.  He is still reluctant to be seen for diabetes education unless he from this with his  PCP Also had recommended Invokana on his last visit which would help in multiple ways including weight loss which he is not able to achieve Advised him to check more readings after meals also  Drew Salinas  10/02/2013 5:10 PM

## 2013-10-02 NOTE — Patient Instructions (Signed)
Sildenafil 20mg , 5 at a time

## 2013-10-03 LAB — TESTOSTERONE: Testosterone: 419.97 ng/dL (ref 350.00–890.00)

## 2013-10-03 NOTE — Progress Notes (Signed)
Quick Note:  Please let patient know that the lab result is in the normal range and to use the AndroGel as directed ______

## 2013-10-16 ENCOUNTER — Other Ambulatory Visit: Payer: Self-pay | Admitting: Family Medicine

## 2013-10-16 DIAGNOSIS — IMO0001 Reserved for inherently not codable concepts without codable children: Secondary | ICD-10-CM

## 2013-10-16 DIAGNOSIS — L989 Disorder of the skin and subcutaneous tissue, unspecified: Secondary | ICD-10-CM

## 2013-10-18 ENCOUNTER — Other Ambulatory Visit: Payer: Self-pay | Admitting: *Deleted

## 2013-10-18 MED ORDER — TESTOSTERONE 10 MG/ACT (2%) TD GEL: TRANSDERMAL | Status: DC

## 2013-11-08 ENCOUNTER — Encounter: Payer: Self-pay | Admitting: Family Medicine

## 2013-11-08 ENCOUNTER — Ambulatory Visit (INDEPENDENT_AMBULATORY_CARE_PROVIDER_SITE_OTHER): Payer: PRIVATE HEALTH INSURANCE | Admitting: Family Medicine

## 2013-11-08 VITALS — BP 148/70 | HR 74 | Temp 98.3°F | Resp 18 | Ht 70.5 in | Wt 269.0 lb

## 2013-11-08 DIAGNOSIS — E1165 Type 2 diabetes mellitus with hyperglycemia: Principal | ICD-10-CM

## 2013-11-08 DIAGNOSIS — IMO0001 Reserved for inherently not codable concepts without codable children: Secondary | ICD-10-CM

## 2013-11-08 LAB — POCT GLYCOSYLATED HEMOGLOBIN (HGB A1C): Hemoglobin A1C: 9.2

## 2013-11-08 MED ORDER — INSULIN ASPART 100 UNIT/ML ~~LOC~~ SOLN: 15.0000 [IU] | Freq: Three times a day (TID) | SUBCUTANEOUS | Status: DC

## 2013-11-08 NOTE — Progress Notes (Signed)
S:  This 63 y.o. Cauc male is here for Type II DM follow-up. FSBS~ 200 (no log of values). Has missed doses of Insulin. Last A1c= 8.1%.   Insurer formulary will no longer cover Apidra; preferred short -acting Insulin is Novolog.  Pt feels well w/o abnormal weight change, vision disturbances, fatigue, diaphoresis, CP or palpitations, SOB or DOE, GI problems, HA, numbness, paresthesias, weakness or syncope. Checks feet daily.  IMM status: Zostavax needed; pt not sure if he has received this one yet.  Patient Active Problem List   Diagnosis Date Noted  . Erectile dysfunction 10/02/2013  . Hypogonadism male 10/02/2013  . Type II or unspecified type diabetes mellitus without mention of complication, uncontrolled 05/23/2012  . Hyperlipidemia 05/23/2012  . Obesity, Class II, BMI 35-39.9, with comorbidity 05/23/2012  . Depression 05/23/2012  . Tobacco user 05/23/2012  . History of stroke without residual deficits 05/23/2012   PMHx, Soc and Fam Hx reviewed.  ROS; AS per HPI.  O: Filed Vitals:   11/08/13 0757  BP: 148/70  Pulse: 74  Temp: 98.3 F (36.8 C)  Resp: 18   GEN: in NAD; WN,WD. Weight about same as 1 year ago. HENT: [place]/AT; EOMI w/ clear conj/sclerae. Otherwise unremarkable. COR: RRR. Distal pulses intact. LUNGS: Unlabored resp. SKIN: W&D; intact w/o diaphoresis. Feet - dry skin but no ulcerations or other lesions. NEURO: A&O x 3; Cns intact. Nonfocal.  Results for orders placed in visit on 11/08/13  POCT GLYCOSYLATED HEMOGLOBIN (HGB A1C)      Result Value Range   Hemoglobin A1C 9.2      A/P: Type II or unspecified type diabetes mellitus without mention of complication, uncontrolled - Plan: POCT glycosylated hemoglobin (Hb A1C) Change short-acting Insulin to Novolog- onset of action < 15 minutes w/ peak at 1-3 hours and duration of action = 3-5 hours. Will start pt at 15- 20 units tid before main meals. Pt encouraged to maintain consistent meal plan and medication compliance.  He and his wife have discussed health plan for this year, to include regular physical activity.  RTC in 3 months w/ glucometer/log.

## 2013-11-26 ENCOUNTER — Telehealth: Payer: Self-pay | Admitting: *Deleted

## 2013-11-26 NOTE — Telephone Encounter (Signed)
Patient advised via phone that our office has been informed that he did not keep his appointment with [facility].  Patient stated that his wife had called their office to reschedule the appointment.  Patient advised to call the office to follow up on appt.

## 2013-12-02 ENCOUNTER — Other Ambulatory Visit (INDEPENDENT_AMBULATORY_CARE_PROVIDER_SITE_OTHER): Payer: PRIVATE HEALTH INSURANCE

## 2013-12-02 DIAGNOSIS — E291 Testicular hypofunction: Secondary | ICD-10-CM

## 2013-12-02 DIAGNOSIS — E1165 Type 2 diabetes mellitus with hyperglycemia: Secondary | ICD-10-CM

## 2013-12-02 DIAGNOSIS — IMO0001 Reserved for inherently not codable concepts without codable children: Secondary | ICD-10-CM

## 2013-12-02 LAB — BASIC METABOLIC PANEL
BUN: 15 mg/dL (ref 6–23)
CALCIUM: 9.4 mg/dL (ref 8.4–10.5)
CO2: 22 mEq/L (ref 19–32)
Chloride: 104 mEq/L (ref 96–112)
Creatinine, Ser: 1.2 mg/dL (ref 0.4–1.5)
GFR: 64.57 mL/min (ref >60.00)
Glucose, Bld: 325 mg/dL — ABNORMAL HIGH (ref 70–99)
Potassium: 4.1 mEq/L (ref 3.5–5.1)
Sodium: 138 mEq/L (ref 135–145)

## 2013-12-05 ENCOUNTER — Ambulatory Visit: Payer: PRIVATE HEALTH INSURANCE | Admitting: Endocrinology

## 2013-12-06 LAB — TESTOSTERONE, TOTAL, LC/MS: TESTOSTERONE, TOTAL: 224.3 ng/dL — AB (ref 348.0–1197.0)

## 2013-12-10 ENCOUNTER — Ambulatory Visit (INDEPENDENT_AMBULATORY_CARE_PROVIDER_SITE_OTHER): Payer: PRIVATE HEALTH INSURANCE | Admitting: Endocrinology

## 2013-12-10 ENCOUNTER — Encounter: Payer: Self-pay | Admitting: Endocrinology

## 2013-12-10 VITALS — BP 128/52 | HR 85 | Temp 98.6°F | Resp 14 | Ht 71.0 in | Wt 267.2 lb

## 2013-12-10 DIAGNOSIS — E291 Testicular hypofunction: Secondary | ICD-10-CM

## 2013-12-10 DIAGNOSIS — IMO0001 Reserved for inherently not codable concepts without codable children: Secondary | ICD-10-CM

## 2013-12-10 DIAGNOSIS — E1165 Type 2 diabetes mellitus with hyperglycemia: Secondary | ICD-10-CM

## 2013-12-10 NOTE — Progress Notes (Signed)
Patient ID: Drew Salinas, male   DOB: 03-14-1951, 63 y.o.   MRN: [id number]  Chief complaint: Low testosterone, followup  History of Present Illness:  He was seen in consultation  In 9/14 for evaluation of a low testosterone level. This was initially tested because of erectile dysfunction At that time his libido had been fairly good and also did not complain of any unusual fatigue, poor muscle endurance,  lack of motivation or depression Evaluation confirmed a low free testosterone level as well as normal prolactin. LH level was mildly increased Because of documented hypogonadism he was started on Androderm 4 mg patch  However because of skin irritation with the patch and inconsistent adherence he was changed to AndroGel Subjectively testosterone supplementation he has had more energy level and mood is improved. Overall feels better.  Has had progressive improvement in erectile dysfunction also; previously had inadequate benefit from Levitra He now indicates that he is using his AndroGel on his thighs rather than the arms Testosterone levels: Baseline 151, after starting AndroGel 420 in 12/14 and now 224 His testosterone was checked on Monday and he may have forgotten to use the AndroGel on Sunday  ED: He was given Jerral Ralph and it works well now with half tablet He is still interested in an alternative medication      Medication List       This list is accurate as of: 12/10/13  4:02 PM.  Always use your most recent med list.               amLODipine 5 MG tablet  Commonly known as:  NORVASC  TAKE 1 TABLET BY MOUTH EVERY DAY     atorvastatin 10 MG tablet  Commonly known as:  LIPITOR  Take 1 tablet by mouth after evening meal.     B-D INS SYR ULTRAFINE 1CC/30G 30G X 1/2" 1 ML Misc  Generic drug:  Insulin Syringe-Needle U-100  USE 6 TIMES A DAY AS DIRECTED     buPROPion 300 MG 24 hr tablet  Commonly known as:  WELLBUTRIN XL  TAKE 1 TABLET (300 MG TOTAL) BY MOUTH EVERY MORNING.     clopidogrel 75 MG tablet  Commonly known as:  PLAVIX  TAKE 1 TABLET (75 MG TOTAL) BY MOUTH DAILY.     insulin aspart 100 UNIT/ML injection  Commonly known as:  novoLOG  Inject 15 Units into the skin 3 (three) times daily before meals.     insulin detemir 100 UNIT/ML injection  Commonly known as:  LEVEMIR  Inject 1 mL (100 Units total) into the skin 2 (two) times daily.     mometasone 50 MCG/ACT nasal spray  Commonly known as:  NASONEX  Place 2 sprays into the nose daily.     sildenafil 20 MG tablet  Commonly known as:  REVATIO  Take 1 tablet (20 mg total) by mouth as directed.     sitaGLIPtin-metformin 50-1000 MG per tablet  Commonly known as:  JANUMET  TAKE 1 TABLET BY MOUTH 2 (TWO) TIMES DAILY WITH A MEAL.     Testosterone 10 MG/ACT (2%) Gel  Commonly known as:  FORTESTA  Apply 2 pumps on each thigh daily     traZODone 100 MG tablet  Commonly known as:  DESYREL  TAKE 1 TABLET BY MOUTH AT BEDTIME        Allergies:  Allergies  Allergen Reactions  . Lisinopril Cough    Past Medical History  Diagnosis Date  . Hypertension   .  Hyperlipidemia   . Allergy   . Stroke 1993  . Diabetes mellitus 1995  . GERD (gastroesophageal reflux disease)     Past Surgical History  Procedure Laterality Date  . Appendectomy      Family History  Problem Relation Age of Onset  . Diabetes Mother   . Diabetes Brother     Social History:  reports that he quit smoking about 5 months ago. His smoking use included Cigarettes. He has a 22.5 pack-year smoking history. He has never used smokeless tobacco. He reports that he drinks about 1.2 ounces of alcohol per week. He reports that he does not use illicit drugs.  REVIEW of systems:  Obesity:  Wt Readings from Last 3 Encounters:  12/10/13 267 lb 3.2 oz (121.201 kg)  11/08/13 269 lb (122.018 kg)  10/02/13 268 lb 4.8 oz (121.7 kg)   He has history of hyperlipidemia treated with Lipitor History of hypertension treated with  amlodipine  Does not think he has a history of depression and had been prescribed Wellbutrin to help quit smoking Also has history of insomnia treated with trazodone Taking Plavix for history of stroke with no residual deficit  LABS:   Appointment on 12/02/2013  Component Date Value Range Status  . Testosterone, total 12/02/2013 224.3* 348.0 - 1197.0 ng/dL Final  . Comment 16/08/9603 Comment   Final   Comment: Adult male reference interval is based on a population of lean males                          up to 62 years old.  . Sodium 12/02/2013 138  135 - 145 mEq/L Final  . Potassium 12/02/2013 4.1  3.5 - 5.1 mEq/L Final  . Chloride 12/02/2013 104  96 - 112 mEq/L Final  . CO2 12/02/2013 22  19 - 32 mEq/L Final  . Glucose, Bld 12/02/2013 325* 70 - 99 mg/dL Final  . BUN 54/07/8118 15  6 - 23 mg/dL Final  . Creatinine, Ser 12/02/2013 1.2  0.4 - 1.5 mg/dL Final  . Calcium 14/78/2956 9.4  8.4 - 10.5 mg/dL Final  . GFR 21/30/8657 64.57  >60.00 mL/min Final  Office Visit on 11/08/2013  Component Date Value Range Status  . 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 Brochure on AndroGel  was given  ERECTILE DYSFUNCTION: He is significantly  improved and he is using only low dose  Stendra and sometimes none; probably has had improved function with testosterone supplementation  DIABETES: Appears to be poorly controlled with significantly high fasting and postprandial readings. Last A1c 9.2 He has been given very low doses of mealtime coverage and also  is very insulin resistant He will discuss with his PCP a referral here for diabetes management   [facility] Center[facility]UM[facility],Gumaro Brightbill  12/10/2013 4:02 PM

## 2013-12-10 NOTE — Patient Instructions (Signed)
Same dose, on arms

## 2014-01-14 ENCOUNTER — Other Ambulatory Visit: Payer: Self-pay | Admitting: Family Medicine

## 2014-02-12 ENCOUNTER — Other Ambulatory Visit: Payer: Self-pay | Admitting: Family Medicine

## 2014-02-14 ENCOUNTER — Ambulatory Visit (INDEPENDENT_AMBULATORY_CARE_PROVIDER_SITE_OTHER): Payer: PRIVATE HEALTH INSURANCE | Admitting: Family Medicine

## 2014-02-14 ENCOUNTER — Encounter: Payer: Self-pay | Admitting: Family Medicine

## 2014-02-14 VITALS — BP 136/64 | HR 71 | Temp 98.5°F | Resp 16 | Ht 71.5 in | Wt 265.8 lb

## 2014-02-14 DIAGNOSIS — E1165 Type 2 diabetes mellitus with hyperglycemia: Principal | ICD-10-CM

## 2014-02-14 DIAGNOSIS — IMO0001 Reserved for inherently not codable concepts without codable children: Secondary | ICD-10-CM

## 2014-02-14 LAB — POCT GLYCOSYLATED HEMOGLOBIN (HGB A1C): HEMOGLOBIN A1C: 8.3

## 2014-02-14 MED ORDER — BUPROPION HCL ER (XL) 300 MG PO TB24: 300.0000 mg | ORAL_TABLET | Freq: Every day | ORAL | Status: DC

## 2014-02-14 NOTE — Progress Notes (Signed)
S:  This 63 y.o. [place] male has Type II DM and associated disorders, most likely metabolic syndrome. He and wife have planned to start walking around the neighborhood. He is compliant w/ medications and uses Novolog at mealtime, Levemir twice daily. Denies hypoglycemia, excessive thirst, diaphoresis, vision disturbances, CP or tightness, HA, numbness, weakness or syncope.  Patient Active Problem List   Diagnosis Date Noted  . Erectile dysfunction 10/02/2013  . Hypogonadism male 10/02/2013  . Type II or unspecified type diabetes mellitus without mention of complication, uncontrolled 05/23/2012  . Hyperlipidemia 05/23/2012  . Obesity, Class II, BMI 35-39.9, with comorbidity 05/23/2012  . Depression 05/23/2012  . Tobacco user 05/23/2012  . History of stroke without residual deficits 05/23/2012   Prior to Admission medications   Medication Sig Start Date End Date Taking? Authorizing Provider  amLODipine (NORVASC) 5 MG tablet TAKE 1 TABLET BY MOUTH EVERY DAY 01/14/14  Yes Morrell RiddleSarah L Weber, PA-C  atorvastatin (LIPITOR) 10 MG tablet Take 1 tablet by mouth after evening meal. 07/12/13  Yes Maurice MarchBarbara B Jasson Siegmann, MD  B-D INS SYR ULTRAFINE 1CC/30G 30G X 1/2" 1 ML MISC USE 6 TIMES A DAY AS DIRECTED 05/16/13  Yes Phillips OdorJeffery Anderson, MD  buPROPion (WELLBUTRIN XL) 300 MG 24 hr tablet Take 1 tablet (300 mg total) by mouth daily.   Yes Maurice MarchBarbara B Larysa Pall, MD  clopidogrel (PLAVIX) 75 MG tablet TAKE 1 TABLET (75 MG TOTAL) BY MOUTH DAILY. 07/12/13  Yes Maurice MarchBarbara B Jurgen Groeneveld, MD  insulin aspart (NOVOLOG) 100 UNIT/ML injection Inject 15 Units into the skin 3 (three) times daily before meals. 11/08/13  Yes Maurice MarchBarbara B Dayden Viverette, MD  insulin detemir (LEVEMIR) 100 UNIT/ML injection Inject 1 mL (100 Units total) into the skin 2 (two) times daily. 07/12/13  Yes Maurice MarchBarbara B Gevon Markus, MD  sildenafil (REVATIO) 20 MG tablet Take 1 tablet (20 mg total) by mouth as directed. 10/02/13  Yes Reather LittlerAjay Kumar, MD  sitaGLIPtin-metformin (JANUMET) 50-1000  MG per tablet TAKE 1 TABLET BY MOUTH 2 (TWO) TIMES DAILY WITH A MEAL. 07/12/13  Yes Maurice MarchBarbara B Soundra Lampley, MD  Testosterone Azucena Freed(FORTESTA) 10 MG/ACT (2%) GEL Apply 2 pumps on each thigh daily 10/18/13  Yes Reather LittlerAjay Kumar, MD  traZODone (DESYREL) 100 MG tablet TAKE 1 TABLET BY MOUTH AT BEDTIME 07/12/13  Yes Maurice MarchBarbara B Georganne Siple, MD  mometasone (NASONEX) 50 MCG/ACT nasal spray Place 2 sprays into the nose daily. 07/12/13   Maurice MarchBarbara B Jordon Bourquin, MD   PMHx, Surg Hx, Soc Hx reviewed.  ROS: As per HPI.  O: Filed Vitals:   02/14/14 0801  BP: 136/64  Pulse: 71  Temp: 98.5 F (36.9 C)  Resp: 16   GEN: In NAD; WN,WD. HENT: [place]/AT; EOMI w/ clear conj/sclerae. Otherwise unremarkable. COR: RRR. Pulses- DP/TD 2+/=. LUNGS: Unlabored resp. SKIN: W&D; Intact w/o diaphoresis, erythema or pallor. Foot exam- mild scaliness w/o ulcerations or lesions. NEURO: A&O x 3; CNs intact. Nonfocal.  Results for orders placed in visit on 02/14/14  POCT GLYCOSYLATED HEMOGLOBIN (HGB A1C)      Result Value Ref Range   Hemoglobin A1C 8.3     A/P: Type II or unspecified type diabetes mellitus without mention of complication, uncontrolled - Improving control; encouraged increased physical activity and nutrition modifications. Continue current medications. Plan: POCT glycosylated hemoglobin (Hb A1C)  RTC in Sept 2015 for CPE/ fasting labs.

## 2014-02-14 NOTE — Patient Instructions (Signed)
How to Increase Fiber in the Meal Plan for Diabetes Increasing fiber in the diet has many benefits including lowering blood cholesterol, helping to control blood glucose (sugar), preventing constipation, and aiding in weight management by helping you feel full longer. Start adding fiber to your diet slowly. A gradual substitution of high-fiber foods for low-fiber foods will allow the digestive tract to adjust. Most men under 62 years of age should aim to eat 38 g of fiber a day. Women should aim for 25 g. Over 61 years of age, most men need 30 g of fiber and most women need 21 g. Below are some suggestions for increasing fiber.  Try whole-wheat bread instead of white bread. Look for words high on the list of ingredients, such as whole wheat, whole rye, or whole oats.  Try a baked potato with skin instead of mashed potatoes.  Try a fresh apple with skin instead of applesauce.  Try a fresh orange instead of orange juice.  Try popcorn instead of potato chips.  Try bran cereal instead of corn flakes.  Try kidney, whole pinto, or garbanzo beans instead of bread.  Try whole-grain crackers instead of saltine crackers.  Try whole-wheat pasta instead of regular varieties. While on a high-fiber diet:   Drink enough water and fluids to keep your urine clear or pale yellow.  Eat a variety of high fiber foods such as fruits, vegetables, whole grains, nuts, and seeds.  Aim for 5 servings of fruit and vegetables per day.  Try to increase your intake of fiber by eating high-fiber foods instead of taking fiber supplements that contain small amounts of fiber. There can be additional benefits for long-term health and blood glucose control with high-fiber foods.  SOURCES OF FIBER The following list shows the average dietary fiber for types of food in the various food groups. Starches and Breads / Dietary Fiber (g)  Whole-grain bread, 1 slice / 2 g  Whole-grain cereals,  cup / 3 g  Bran cereals,   to  cup / 8 g  Starchy vegetables,  cup / 3 g  Oatmeal,  cup / 2 g  Whole-wheat pasta,  cup / 2 g  Brown rice,  cup / 2 g  Barley,  cup / 3 g Legumes / Dietary Fiber (g)  Beans,  cup / 8 g  Peas,  cup / 8 g  Lentils ,  cup / 8 g Meat and Meat Substitutes / Dietary Fiber (g) This group averages 0 grams of fiber. Exceptions are:  Nuts, seeds, 1 oz or  cup / 3 g  Chunky peanut butter, 2 tbs / 3 g Vegetables / Dietary Fiber (g)  Cooked vegetables,  to  cup / 2 to 3 g  Raw vegetables, 1 to 2 cups / 2 to 3 g Fruit / Dietary Fiber (g)  Raw or cooked fruit,  cup or 1 small, fresh piece / 2 g Milk / Dietary Fiber (g)  Milk, 1 cup or 8 oz / 0 g Fats and Oils / Dietary Fiber (g)  Fats and oils, 1 tsp / 0 g You can determine how much fiber you are eating by reading the Nutrition Facts panel on the labels of the foods you eat. FIBER IN SPECIFIC FOODS Cereals / Dietary Fiber (g)  All Bran,  cup / 9 g  All Bran with Extra Fiber,  cup / 13 g  Bran Flakes,  cup / 4 g  Cheerios,  cup / 1.5 g  Corn Bran,  cup / 4 g  Corn Flakes,  cup / 0.75 g  Cracklin' Oat Bran,  cup / 4 g  Fiber One,  cup / 13 g  Grape Nuts, 3 tbs / 3 g  Grape Nuts Flakes,  cup / 3 g  Noodles,  cup, cooked / 0.5 g  Nutrigrain Wheat,  cup / 3.5 g  Oatmeal,  cup, cooked / 1.1 g  Pasta, white (macaroni, spaghetti),  cup, cooked / 0.5 g  Pasta, whole-wheat (macaroni, spaghetti),  cup, cooked / 2 g  [place],  cup, cooked / 3 g  Rice, wild,  cup, cooked / 0.5 g  Rice, brown,  cup, cooked / 1 g  Rice, white,  cup, cooked / 0.2 g  Shredded Wheat, bite-sized,  cup / 2 g  Total,  cup / 1.75 g  Wheat Chex,  cup / 2.5 g  Wheatena,  cup, cooked / 4 g  Wheaties,  cup / 2.75 g Bread, Starchy Vegetables, and Dried Peas and Beans / Dietary Fiber (g)  Bagel, whole / 0.6 g  Baked beans in tomato sauce,  cup, cooked / 3 g  Bran muffin, 1 small  / 2.5 g  Bread, cracked wheat, 1 slice / 2.5 g  Bread, pumpernickel, 1 slice / 2.5 g  Bread, white, 1 slice / 0.4 g  Bread, whole-wheat, 1 slice / 1.4 g  Corn,  cup, canned / 2.9 g  Kidney beans,  cup, cooked / 3.5 g  Lentils, cup, cooked / 3 g  Lima beans,  cup, cooked / 4 g  Navy beans,  cup, cooked / 4 g  Peas,  cup, cooked / 4 g  Popcorn, 3 cups popped, unbuttered / 3.5 g  Potato, baked (with skin), 1 small / 4 g  Potato, baked (without skin), 1 small / 2 g  Ry-Krisp, 4 crackers / 3 g  Saltine crackers, 6 squares / 0 g  Split peas,  cup, cooked / 2.5 g  Yams (sweet potato),  cup / 1.7 g Fruit / Dietary Fiber (g)  Apple, 1 small, fresh, with skin / 4 g  Apple juice,  cup / 0.4 g  Apricots, 4 medium, fresh / 4 g  Apricots, 7 halves, dried / 2 g  Banana,  medium / 1.2 g  Blueberries,  cup / 2 g  Cantaloupe, melon / 1.3 g  Cherries,  cup, canned / 1.4 g  Grapefruit,  medium / 1.6 g  Grapes, 15 small / 1.2 g  Grape juice,  cup / 0.5 g  Orange, 1 medium, fresh / 2 g  Orange juice,  cup / 0.5 g  Peach, 1 medium,fresh, with skin / 2 g  Pear, 1 medium, fresh, with skin / 4 g  Pineapple, cup, canned / 0.7 g  Plums, 2 whole / 2 g  Prunes, 3 whole / 1.5 g  Raspberries, 1 cup / 6 g  Strawberries, 1  cup / 4 g  Watermelon, 1  cup / 0.5 g Vegetables / Dietary Fiber (g)  Asparagus,  cup, cooked / 1 g  Beans, green and wax,  cup, cooked / 1.6 g  Beets,  cup, cooked / 1.8 g  Broccoli,  cup, cooked / 2.2 g  Brussels sprouts,  cup, cooked / 4 g  Cabbage,  cup, cooked / 2.5 g  Carrots,  cup, cooked / 2.3 g  Cauliflower,  cup, cooked / 1.1 g  Celery, 1 cup, raw /  1.5 g  Cucumber, 1 cup, raw / 0.8 g  Green pepper,  cup sliced, cooked / 1.5 g  Lettuce, 1 cup, sliced / 0.9 g  Mushrooms, 1 cup sliced, raw / 1.8 g  Onion, 1 cup sliced, raw / 1.6 g  Spinach,  cup, cooked / 2.4 g  Tomato, 1 medium, fresh / 1.5  g  Tomato juice,  cup / 0 g  Zucchini,  cup, cooked / 1.8 g Document Released: 04/08/2002 Document Revised: 02/11/2013 Document Reviewed: 05/05/2009 [employer] Patient Information 2014 [place], [place].   Exercise to Stay Healthy Exercise helps you become and stay healthy. EXERCISE IDEAS AND TIPS Choose exercises that:  You enjoy.  Fit into your day. You do not need to exercise really hard to be healthy. You can do exercises at a slow or medium level and stay healthy. You can:  Stretch before and after working out.  Try yoga, Pilates, or tai chi.  Lift weights.  Walk fast, swim, jog, run, climb stairs, bicycle, dance, or rollerskate.  Take aerobic classes. Exercises that burn about 150 calories:  Running 1  miles in 15 minutes.  Playing volleyball for 45 to 60 minutes.  Washing and waxing a car for 45 to 60 minutes.  Playing touch football for 45 minutes.  Walking 1  miles in 35 minutes.  Pushing a stroller 1  miles in 30 minutes.  Playing basketball for 30 minutes.  Raking leaves for 30 minutes.  Bicycling 5 miles in 30 minutes.  Walking 2 miles in 30 minutes.  Dancing for 30 minutes.  Shoveling snow for 15 minutes.  Swimming laps for 20 minutes.  Walking up stairs for 15 minutes.  Bicycling 4 miles in 15 minutes.  Gardening for 30 to 45 minutes.  Jumping rope for 15 minutes.  Washing windows or floors for 45 to 60 minutes. Document Released: 11/19/2010 Document Revised: 01/09/2012 Document Reviewed: 11/19/2010 [facility] Patient Information 2014 [place], [place].

## 2014-02-19 ENCOUNTER — Other Ambulatory Visit: Payer: Self-pay | Admitting: Family Medicine

## 2014-02-28 ENCOUNTER — Other Ambulatory Visit: Payer: Self-pay | Admitting: Family Medicine

## 2014-02-28 NOTE — Telephone Encounter (Signed)
Lipitor refilled for 6 months.

## 2014-02-28 NOTE — Telephone Encounter (Signed)
Dr Audria NineMcPherson, you just saw pt for check up but don't see lipitor addressed. Can we RF until he's due for f/up in Sept?

## 2014-03-09 ENCOUNTER — Other Ambulatory Visit: Payer: Self-pay | Admitting: Family Medicine

## 2014-03-09 ENCOUNTER — Other Ambulatory Visit: Payer: Self-pay | Admitting: Endocrinology

## 2014-03-11 NOTE — Telephone Encounter (Signed)
Dr Audria NineMcPherson, you just saw pt for check up and not due for f/up until Sept, but don't see Plavix discussed. Can we RF until f/up?

## 2014-03-20 ENCOUNTER — Telehealth: Payer: Self-pay

## 2014-03-20 MED ORDER — TRAZODONE HCL 100 MG PO TABS: ORAL_TABLET | ORAL | Status: DC

## 2014-03-20 NOTE — Telephone Encounter (Signed)
Dr. Audria NineMcpherson, [facility] called because they have been trying to send a RF request for Trazadone 100mg  and it keeps failing. Can we RF please.

## 2014-03-20 NOTE — Telephone Encounter (Signed)
Trazodone refilled x 6 months. 

## 2014-04-09 ENCOUNTER — Ambulatory Visit: Payer: PRIVATE HEALTH INSURANCE | Admitting: Endocrinology

## 2014-04-17 ENCOUNTER — Other Ambulatory Visit: Payer: Self-pay

## 2014-04-17 NOTE — Telephone Encounter (Signed)
The endocrinologist who has been seeing patient for testosterone, but has recommended patient increase insulin to 30 units tid, you have him on 15 units tid, patient has already increased the dose, and will be out of insulin tomorrow. I pended this for him at the increased dose, please advise.

## 2014-04-17 NOTE — Telephone Encounter (Signed)
insulin aspart (NOVOLOG) 100 UNIT/ML injection [[id number]][[id number]] said pharmacy is sending over what they have records of, but pt needs 30 units

## 2014-04-18 ENCOUNTER — Telehealth: Payer: Self-pay

## 2014-04-18 ENCOUNTER — Ambulatory Visit (INDEPENDENT_AMBULATORY_CARE_PROVIDER_SITE_OTHER): Payer: PRIVATE HEALTH INSURANCE | Admitting: Family Medicine

## 2014-04-18 ENCOUNTER — Ambulatory Visit (INDEPENDENT_AMBULATORY_CARE_PROVIDER_SITE_OTHER): Payer: PRIVATE HEALTH INSURANCE

## 2014-04-18 VITALS — BP 126/82 | HR 72 | Temp 98.1°F | Resp 16 | Ht 70.0 in | Wt 270.0 lb

## 2014-04-18 DIAGNOSIS — R109 Unspecified abdominal pain: Secondary | ICD-10-CM

## 2014-04-18 DIAGNOSIS — K5732 Diverticulitis of large intestine without perforation or abscess without bleeding: Secondary | ICD-10-CM

## 2014-04-18 DIAGNOSIS — R1032 Left lower quadrant pain: Secondary | ICD-10-CM

## 2014-04-18 DIAGNOSIS — R062 Wheezing: Secondary | ICD-10-CM

## 2014-04-18 DIAGNOSIS — IMO0001 Reserved for inherently not codable concepts without codable children: Secondary | ICD-10-CM

## 2014-04-18 DIAGNOSIS — E1165 Type 2 diabetes mellitus with hyperglycemia: Secondary | ICD-10-CM

## 2014-04-18 LAB — POCT CBC
Granulocyte percent: 75.3 %G (ref 37–80)
HEMATOCRIT: 40.5 % — AB (ref 43.5–53.7)
HEMOGLOBIN: 12.9 g/dL — AB (ref 14.1–18.1)
Lymph, poc: 1.7 (ref 0.6–3.4)
MCH: 30.2 pg (ref 27–31.2)
MCHC: 31.9 g/dL (ref 31.8–35.4)
MCV: 94.9 fL (ref 80–97)
MID (cbc): 0.6 (ref 0–0.9)
MPV: 10.8 fL (ref 0–99.8)
POC Granulocyte: 7.1 — AB (ref 2–6.9)
POC LYMPH PERCENT: 18.5 %L (ref 10–50)
POC MID %: 6.2 % (ref 0–12)
Platelet Count, POC: 146 10*3/uL (ref 142–424)
RBC: 4.27 M/uL — AB (ref 4.69–6.13)
RDW, POC: 14.7 %
WBC: 9.4 10*3/uL (ref 4.6–10.2)

## 2014-04-18 LAB — POCT URINALYSIS DIPSTICK
BILIRUBIN UA: NEGATIVE
Leukocytes, UA: NEGATIVE
NITRITE UA: NEGATIVE
RBC UA: NEGATIVE
SPEC GRAV UA: 1.025
Urobilinogen, UA: 0.2
pH, UA: 5.5

## 2014-04-18 LAB — POCT UA - MICROSCOPIC ONLY
Bacteria, U Microscopic: NEGATIVE
Casts, Ur, LPF, POC: NEGATIVE
Crystals, Ur, HPF, POC: NEGATIVE
MUCUS UA: NEGATIVE
Yeast, UA: NEGATIVE

## 2014-04-18 LAB — GLUCOSE, POCT (MANUAL RESULT ENTRY): POC Glucose: 246 mg/dl — AB (ref 70–99)

## 2014-04-18 MED ORDER — CIPROFLOXACIN HCL 500 MG PO TABS: 500.0000 mg | ORAL_TABLET | Freq: Two times a day (BID) | ORAL | Status: DC

## 2014-04-18 MED ORDER — INSULIN ASPART 100 UNIT/ML ~~LOC~~ SOLN: 15.0000 [IU] | Freq: Three times a day (TID) | SUBCUTANEOUS | Status: DC

## 2014-04-18 NOTE — Telephone Encounter (Signed)
Novolog Insulin refilled.

## 2014-04-18 NOTE — Progress Notes (Signed)
Subjective: 63 year old gentleman who is here with a history of having back pain develop yesterday. Does not know of anything that he did to cause the pain to start. It does hurt across the back. No nausea or vomiting. Had a normal bowel movement this morning. Does not have complaints of constipation. He had no dysuria. No blood in the stool or urine. He does smoke and has little wheezing frequently. 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 No fever.  Objective: Obese white male no acute distress. The pain apparently was 8/10 earlier there is very little right this minute. The pain came and went some. No CVA tenderness. Chest is wheezing at both bases. Abdomen has diminished present bowel sounds. Soft. No specific tenderness. He indicates the pain radiated around from the left flank down to the left lower abdomen.  Assessment: Left abdominal pain and left flank pain Rule out things like kidney stones or diverticulitis. Symptoms could be from high constipation and gas Wheezing Obesity   Plan: CBC, urine, chest x-ray and abdominal films  Results for orders placed in visit on 04/18/14  POCT CBC      Result Value Ref Range   WBC 9.4  4.6 - 10.2 K/uL   Lymph, poc 1.7  0.6 - 3.4   POC LYMPH PERCENT 18.5  10 - 50 %L   MID (cbc) 0.6  0 - 0.9   POC MID % 6.2  0 - 12 %M   POC Granulocyte 7.1 (*) 2 - 6.9   Granulocyte percent 75.3  37 - 80 %G   RBC 4.27 (*) 4.69 - 6.13 M/uL   Hemoglobin 12.9 (*) 14.1 - 18.1 g/dL   HCT, POC 16.140.5 (*) 09.643.5 - 53.7 %   MCV 94.9  80 - 97 fL   MCH, POC 30.2  27 - 31.2 pg   MCHC 31.9  31.8 - 35.4 g/dL   RDW, POC 04.514.7     Platelet Count, POC 146  142 - 424 K/uL   MPV 10.8  0 - 99.8 fL  POCT UA - MICROSCOPIC ONLY      Result Value Ref Range   WBC, Ur, HPF, POC 0-1     RBC, urine, microscopic 0-1     Bacteria, U Microscopic neg     Mucus, UA neg     Epithelial cells, urine per micros 0-1     Crystals, Ur, HPF, POC neg     Casts, Ur, LPF, POC neg     Yeast, UA neg    POCT URINALYSIS DIPSTICK      Result Value Ref Range   Color, UA yellow     Clarity, UA slightly cloudy     Glucose, UA >=1000     Bilirubin, UA neg     Ketones, UA trace     Spec Grav, UA 1.025     Blood, UA neg     pH, UA 5.5     Protein, UA trace     Urobilinogen, UA 0.2     Nitrite, UA neg     Leukocytes, UA Negative       [facility] reading (PRIMARY) by  Dr. Alwyn RenHopper Normal chest and abdomen  .  Colonoscopy previously showed diverticulosis. I am going to cover with some Cipro for diverticulitis, though it may well be just stool and gas causing the pain. He is to return or go to emergency room if in all worse. Discussed the need for watching  his diet and exercising.  Assessment: Possible diverticulitis Possible constipation Abdominal pain and flank pain

## 2014-04-18 NOTE — Telephone Encounter (Signed)
PATIENT WANTS TO INCREASE NOVOLOG UNITS FROM 15 TO 30.

## 2014-04-18 NOTE — Telephone Encounter (Signed)
Pt is in with Dr. Alwyn RenHopper at this time.

## 2014-04-18 NOTE — Patient Instructions (Signed)
In the event of getting abruptly worse go to the emergency room  If you're still hurting significantly by tomorrow come in for recheck  You did have moderate diverticulosis on your last colonoscopy, so I'm going to treat you with some Cipro for an antibiotic in case this is being caused by diverticulitis  Take MiraLax (over-the-counter laxative which is very gentle) one dose tonight and one dose in the morning and see if you can get your bowels move several times.

## 2014-05-21 ENCOUNTER — Other Ambulatory Visit: Payer: Self-pay | Admitting: Physician Assistant

## 2014-05-23 ENCOUNTER — Other Ambulatory Visit: Payer: Self-pay | Admitting: Emergency Medicine

## 2014-05-27 ENCOUNTER — Other Ambulatory Visit: Payer: Self-pay | Admitting: Physician Assistant

## 2014-05-30 ENCOUNTER — Other Ambulatory Visit: Payer: Self-pay | Admitting: Physician Assistant

## 2014-05-30 ENCOUNTER — Other Ambulatory Visit: Payer: Self-pay | Admitting: Family Medicine

## 2014-05-30 MED ORDER — SITAGLIPTIN PHOS-METFORMIN HCL 50-1000 MG PO TABS: 1.0000 | ORAL_TABLET | Freq: Two times a day (BID) | ORAL | Status: DC

## 2014-05-30 NOTE — Telephone Encounter (Signed)
Patient wife called to see why janumet was denied. I looked into it and I see where refill request was received and refused but it was not from our office. He was seen last 03/2014 so will send in refills until next appt in September. Wife notified

## 2014-06-16 ENCOUNTER — Other Ambulatory Visit: Payer: Self-pay | Admitting: Endocrinology

## 2014-07-25 ENCOUNTER — Encounter: Payer: PRIVATE HEALTH INSURANCE | Admitting: Family Medicine

## 2014-08-13 ENCOUNTER — Encounter (INDEPENDENT_AMBULATORY_CARE_PROVIDER_SITE_OTHER): Payer: PRIVATE HEALTH INSURANCE | Admitting: Ophthalmology

## 2014-08-13 DIAGNOSIS — E11319 Type 2 diabetes mellitus with unspecified diabetic retinopathy without macular edema: Secondary | ICD-10-CM

## 2014-08-13 DIAGNOSIS — H43813 Vitreous degeneration, bilateral: Secondary | ICD-10-CM

## 2014-08-13 DIAGNOSIS — H35033 Hypertensive retinopathy, bilateral: Secondary | ICD-10-CM

## 2014-08-13 DIAGNOSIS — E11329 Type 2 diabetes mellitus with mild nonproliferative diabetic retinopathy without macular edema: Secondary | ICD-10-CM

## 2014-08-14 ENCOUNTER — Other Ambulatory Visit: Payer: Self-pay | Admitting: Family Medicine

## 2014-08-22 ENCOUNTER — Other Ambulatory Visit: Payer: Self-pay | Admitting: Family Medicine

## 2014-08-25 ENCOUNTER — Other Ambulatory Visit: Payer: Self-pay | Admitting: Physician Assistant

## 2014-08-25 ENCOUNTER — Other Ambulatory Visit: Payer: Self-pay | Admitting: Family Medicine

## 2014-08-27 ENCOUNTER — Other Ambulatory Visit: Payer: Self-pay | Admitting: Family Medicine

## 2014-09-12 ENCOUNTER — Encounter: Payer: Self-pay | Admitting: Family Medicine

## 2014-09-12 ENCOUNTER — Ambulatory Visit (INDEPENDENT_AMBULATORY_CARE_PROVIDER_SITE_OTHER): Payer: PRIVATE HEALTH INSURANCE | Admitting: Family Medicine

## 2014-09-12 VITALS — BP 150/72 | HR 88 | Temp 97.9°F | Resp 16 | Ht 71.0 in | Wt 265.0 lb

## 2014-09-12 DIAGNOSIS — R202 Paresthesia of skin: Secondary | ICD-10-CM

## 2014-09-12 DIAGNOSIS — Z23 Encounter for immunization: Secondary | ICD-10-CM

## 2014-09-12 DIAGNOSIS — I10 Essential (primary) hypertension: Secondary | ICD-10-CM

## 2014-09-12 DIAGNOSIS — IMO0002 Reserved for concepts with insufficient information to code with codable children: Secondary | ICD-10-CM

## 2014-09-12 DIAGNOSIS — Z0000 Encounter for general adult medical examination without abnormal findings: Secondary | ICD-10-CM

## 2014-09-12 DIAGNOSIS — E291 Testicular hypofunction: Secondary | ICD-10-CM

## 2014-09-12 DIAGNOSIS — E1165 Type 2 diabetes mellitus with hyperglycemia: Secondary | ICD-10-CM

## 2014-09-12 LAB — LIPID PANEL
CHOL/HDL RATIO: 2.6 ratio
CHOLESTEROL: 125 mg/dL (ref 0–200)
HDL: 49 mg/dL (ref >39)
LDL CALC: 36 mg/dL (ref 0–99)
TRIGLYCERIDES: 198 mg/dL — AB (ref <150)
VLDL: 40 mg/dL (ref 0–40)

## 2014-09-12 LAB — CBC WITH DIFFERENTIAL/PLATELET
Basophils Absolute: 0.1 10*3/uL (ref 0.0–0.1)
Basophils Relative: 1 % (ref 0–1)
Eosinophils Absolute: 0.2 10*3/uL (ref 0.0–0.7)
Eosinophils Relative: 2 % (ref 0–5)
HCT: 41.1 % (ref 39.0–52.0)
HEMOGLOBIN: 14.2 g/dL (ref 13.0–17.0)
LYMPHS ABS: 1.5 10*3/uL (ref 0.7–4.0)
Lymphocytes Relative: 19 % (ref 12–46)
MCH: 31.2 pg (ref 26.0–34.0)
MCHC: 34.5 g/dL (ref 30.0–36.0)
MCV: 90.3 fL (ref 78.0–100.0)
MONO ABS: 0.8 10*3/uL (ref 0.1–1.0)
MONOS PCT: 10 % (ref 3–12)
NEUTROS ABS: 5.2 10*3/uL (ref 1.7–7.7)
NEUTROS PCT: 68 % (ref 43–77)
Platelets: 159 10*3/uL (ref 150–400)
RBC: 4.55 MIL/uL (ref 4.22–5.81)
RDW: 13.7 % (ref 11.5–15.5)
WBC: 7.7 10*3/uL (ref 4.0–10.5)

## 2014-09-12 LAB — COMPLETE METABOLIC PANEL WITH GFR
ALBUMIN: 4 g/dL (ref 3.5–5.2)
ALK PHOS: 81 U/L (ref 39–117)
ALT: 33 U/L (ref 0–53)
AST: 32 U/L (ref 0–37)
BUN: 10 mg/dL (ref 6–23)
CO2: 27 mEq/L (ref 19–32)
Calcium: 9.3 mg/dL (ref 8.4–10.5)
Chloride: 100 mEq/L (ref 96–112)
Creat: 0.98 mg/dL (ref 0.50–1.35)
GFR, EST NON AFRICAN AMERICAN: 82 mL/min
GFR, Est African American: >89 mL/min
GLUCOSE: 190 mg/dL — AB (ref 70–99)
POTASSIUM: 4.4 meq/L (ref 3.5–5.3)
SODIUM: 139 meq/L (ref 135–145)
TOTAL PROTEIN: 6.9 g/dL (ref 6.0–8.3)
Total Bilirubin: 0.6 mg/dL (ref 0.2–1.2)

## 2014-09-12 LAB — POCT GLYCOSYLATED HEMOGLOBIN (HGB A1C): Hemoglobin A1C: 9

## 2014-09-12 NOTE — Patient Instructions (Addendum)
Keeping you healthy  Get these tests  Blood pressure- Have your blood pressure checked once a year by your healthcare provider.  Normal blood pressure is 120/80  Weight- Have your body mass index (BMI) calculated to screen for obesity.  BMI is a measure of body fat based on height and weight. You can also calculate your own BMI at [email or web address]  Cholesterol- Have your cholesterol checked every year.  Diabetes- Have your blood sugar checked regularly if you have high blood pressure, high cholesterol, have a family history of diabetes or if you are overweight.  Screening for Colon Cancer- Colonoscopy starting at age 62.  Screening may begin sooner depending on your family history and other health conditions. Follow up colonoscopy as directed by your Gastroenterologist.  Screening for Prostate Cancer- Both blood work (PSA) and a rectal exam help screen for Prostate Cancer.  Screening begins at age 62 with African-American men and at age 650 with Caucasian men.  Screening may begin sooner depending on your family history.  Take these medicines  Aspirin- One aspirin daily can help prevent Heart disease and Stroke.  Flu shot- Every fall.  Tetanus- Every 10 years.  Zostavax- Once after the age of 660 to prevent Shingles.  Pneumonia shot- Once after the age of 62; if you are younger than 5865, ask your healthcare provider if you need a Pneumonia shot.  Take these steps  Don't smoke- If you do smoke, talk to your doctor about quitting.  For tips on how to quit, go to www.smokefree.gov or call 1-800-QUIT-NOW.  Be physically active- Exercise 5 days a week for at least 30 minutes.  If you are not already physically active start slow and gradually work up to 30 minutes of moderate physical activity.  Examples of moderate activity include walking briskly, mowing the yard, dancing, swimming, bicycling, etc.  Eat a healthy diet- Eat a variety of healthy food such as fruits, vegetables, low  fat milk, low fat cheese, yogurt, lean meant, poultry, fish, beans, tofu, etc. For more information go to www.thenutritionsource.org  Drink alcohol in moderation- Limit alcohol intake to less than two drinks a day. Never drink and drive.  Dentist- Brush and floss twice daily; visit your dentist twice a year.  Depression- Your emotional health is as important as your physical health. If you're feeling down, or losing interest in things you would normally enjoy please talk to your healthcare provider.  Eye exam- Visit your eye doctor every year.  Safe sex- If you may be exposed to a sexually transmitted infection, use a condom.  Seat belts- Seat belts can save your life; always wear one.  Smoke/Carbon Monoxide detectors- These detectors need to be installed on the appropriate level of your home.  Replace batteries at least once a year.  Skin cancer- When out in the sun, cover up and use sunscreen 15 SPF or higher.  Violence- If anyone is threatening you, please tell your healthcare provider.  Living Will/ Health care power of attorney- Speak with your healthcare provider and family.    You have noticed numbness in your toes after a long day at work. This may ne Diabetic neuropathy or related to degenerative disease involving the lower spine. Getting your Diabetes under good control can keep this problem from progressing. I may need to perform some xrays on the lumbar spine to evaluate this problem. You can try an over-the-counter PHYS ASSIST FOOT PAIN CREAM- it has Tea Tree in it and is available at [facility]. It is good for neuropathic pain.  Remember to ask your wife if she recalls you getting the Shingles vaccine. We will give the Tdap at your next visit.

## 2014-09-12 NOTE — Progress Notes (Signed)
Subjective:    Patient ID: Noberto RetortLarry Kille, male    DOB: 12/29/1950, 63 y.o.   MRN: [id number]  HPI  This 63 y.o. Cauc male presents for annual wellness exam.  He has Type II DM- Insulin dependent, dyslipidemia, chronic depression and ED (evaluated and treated by specialist but pt off medications currently).  HCM: CRS- Current (Nov 2014 w/ 10-yr recall).           IMM- Needs Tdap but will have that at next visit.           Vision- Current (annually).  Patient Active Problem List   Diagnosis Date Noted  . Erectile dysfunction 10/02/2013  . Hypogonadism male 10/02/2013  . Type II or unspecified type diabetes mellitus without mention of complication, uncontrolled 05/23/2012  . Hyperlipidemia 05/23/2012  . Obesity, Class II, BMI 35-39.9, with comorbidity 05/23/2012  . Depression 05/23/2012  . Tobacco user 05/23/2012  . History of stroke without residual deficits 05/23/2012    Prior to Admission medications   Medication Sig Start Date End Date Taking? Authorizing Provider  amLODipine (NORVASC) 5 MG tablet Take 1 tablet by mouth every day 08/22/14  Yes Sarah Harvie BridgeL Weber, PA-C  atorvastatin (LIPITOR) 10 MG tablet Take 1 tablet by mouth every day after evening meal   "FOLLOW UP FOR ADDITIONAL REFILLS" 08/25/14  Yes Chelle S Jeffery, PA-C  B-D INS SYR ULTRAFINE 1CC/30G 30G X 1/2" 1 ML MISC USE 6 TIMES A DAY AS DIRECTED   Yes Tonye Pearsonobert P Doolittle, MD  buPROPion (WELLBUTRIN XL) 300 MG 24 hr tablet Take 1 tablet (300 mg total) by mouth daily. 02/14/14  Yes Maurice MarchBarbara B Jquan Egelston, MD  ciprofloxacin (CIPRO) 500 MG tablet Take 1 tablet (500 mg total) by mouth 2 (two) times daily. 04/18/14  Yes Peyton Najjaravid H Hopper, MD  clopidogrel (PLAVIX) 75 MG tablet TAKE 1 TABLET (75 MG TOTAL) BY MOUTH DAILY. 08/27/14  Yes Maurice MarchBarbara B Illianna Paschal, MD  insulin aspart (NOVOLOG) 100 UNIT/ML injection Inject 15 Units into the skin 3 (three) times daily with meals. Inject 30 mg tid   Yes Maurice MarchBarbara B Ousman Dise, MD  insulin detemir (LEVEMIR) 100  UNIT/ML injection INJECT 1 ML (100 UNITS TOTAL) INTO THE SKIN 2 (TWO) TIMES DAILY 08/15/14  Yes Chelle S Jeffery, PA-C  sildenafil (REVATIO) 20 MG tablet Take 1 tablet (20 mg total) by mouth as directed. 10/02/13  Yes Reather LittlerAjay Kumar, MD  sitaGLIPtin-metformin (JANUMET) 50-1000 MG per tablet TAKE 1 TABLET BY MOUTH 2 (TWO) TIMES DAILY WITH A MEAL 08/25/14  Yes Chelle S Jeffery, PA-C  STENDRA 200 MG TABS TAKE 1 TABLET BY MOTH AS NEEDED   Yes Reather LittlerAjay Kumar, MD  traZODone (DESYREL) 100 MG tablet TAKE 1 TABLET BY MOUTH AT BEDTIME 03/20/14  Yes Maurice MarchBarbara B Danny Zimny, MD  mometasone (NASONEX) 50 MCG/ACT nasal spray Place 2 sprays into the nose daily. 07/12/13   Maurice MarchBarbara B Yazleen Molock, MD  Testosterone Azucena Freed(FORTESTA) 10 MG/ACT (2%) GEL Apply 2 pumps on each thigh daily 10/18/13   Reather LittlerAjay Kumar, MD    History   Social History  . Marital Status: Married    Spouse Name: N/A    Number of Children: 3  . 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 Smoking status: Former Smoker -- 0.50 packs/day for 45 years    Types: Cigarettes    Quit date: 07/05/2013  . Smokeless tobacco: Never Used     Comment: quit on  07/05/2013  . Alcohol Use: 1.2 oz/week    2 Cans of beer per week  . Drug Use: No  . Sexual Activity: Yes   Other Topics Concern  . Not on file   Social History Narrative    Family History  Problem Relation Age of Onset  . Diabetes Mother   . Diabetes Brother     Review of Systems  Constitutional: Negative.   HENT: Positive for congestion.   Eyes: Negative.   Respiratory: Positive for cough. Negative for choking, chest tightness, shortness of breath and wheezing.        High pitched noises when breathing.  Cardiovascular: Negative.   Gastrointestinal:       Abd wall hernia.  Musculoskeletal: Positive for back pain and arthralgias. Negative for myalgias, joint swelling, gait problem and neck pain.  Skin: Negative.   Allergic/Immunologic:  Positive for environmental allergies.  Neurological: Negative.        C/o toe numbness after long day at work.  Psychiatric/Behavioral: Negative.        Objective:   Physical Exam  Constitutional: He is oriented to person, place, and time. Vital signs are normal. He appears well-developed and well-nourished. No distress.  HENT:  Head: Normocephalic and atraumatic.  Right Ear: Hearing, tympanic membrane, external ear and ear canal normal.  Left Ear: Hearing, tympanic membrane, external ear and ear canal normal.  Nose: Nose normal. No mucosal edema, nasal deformity or septal deviation.  Mouth/Throat: Uvula is midline, oropharynx is clear and moist and mucous membranes are normal. No oral lesions. Normal dentition.  Eyes: Conjunctivae, EOM and lids are normal. Pupils are equal, round, and reactive to light. No scleral icterus.  Annual eye exam performed by specialist in 10/ 2015.  Neck: Trachea normal, normal range of motion, full passive range of motion without pain and phonation normal. Neck supple. No JVD present. No spinous process tenderness and no muscular tenderness present. Carotid bruit is not present. No thyroid mass and no thyromegaly present.  Cardiovascular: Normal rate, regular rhythm, S1 normal, S2 normal, intact distal pulses and normal pulses.   No extrasystoles are present. PMI is not displaced.  Exam reveals distant heart sounds. Exam reveals no gallop and no friction rub.   No murmur heard. Pulmonary/Chest: Effort normal and breath sounds normal. No respiratory distress. He has no wheezes. He has no rhonchi.  Barrel-chested.  Abdominal: Soft. Bowel sounds are normal. He exhibits no distension, no abdominal bruit, no pulsatile midline mass and no mass. There is no hepatosplenomegaly. There is no tenderness. There is no guarding and no CVA tenderness. A hernia is present. Hernia confirmed positive in the ventral area.  Genitourinary:  Deferred.  Musculoskeletal:        Cervical back: Normal.       Thoracic back: Normal.       Lumbar back: He exhibits tenderness and spasm. He exhibits no bony tenderness, no swelling, no deformity and no laceration.  Remainder of exam unremarkable.  Lymphadenopathy:       Head (right side): No submental, no submandibular, no tonsillar, no preauricular, no posterior auricular and no occipital adenopathy present.       Head (left side): No submental, no submandibular, no tonsillar, no preauricular, no posterior auricular and no occipital adenopathy present.    He has no cervical adenopathy.    He has no axillary adenopathy.       Right: No inguinal and no supraclavicular adenopathy present.       Left:  No inguinal and no supraclavicular adenopathy present.  Neurological: He is alert and oriented to person, place, and time. He has normal strength. He displays no atrophy and no tremor. No cranial nerve deficit or sensory deficit. He exhibits normal muscle tone. He displays a negative Romberg sign. Coordination and gait normal.  Reflex Scores:      Tricep reflexes are 1+ on the right side and 1+ on the left side.      Bicep reflexes are 1+ on the right side and 1+ on the left side.      Brachioradialis reflexes are 1+ on the right side and 1+ on the left side.      Patellar reflexes are 1+ on the right side and 1+ on the left side. DTRs difficult to illicit.  Skin: Skin is warm, dry and intact. No ecchymosis, no lesion and no rash noted. He is not diaphoretic. No cyanosis or erythema. No pallor.  Psychiatric: He has a normal mood and affect. His speech is normal and behavior is normal. Judgment and thought content normal. Cognition and memory are normal.  Nursing note and vitals reviewed.   Results for orders placed or performed in visit on 09/12/14  POCT glycosylated hemoglobin (Hb A1C)  Result Value Ref Range   Hemoglobin A1C 9.0        Assessment & Plan:  Physical exam, annual  Type II diabetes mellitus, uncontrolled -  Encouraged pt to commit to healthy nutrition and increased fitness. Coinue FSBS  No medication change at this time. Encouraged improved compliance with Insulin and meal-planning. Plan: HM Diabetes Foot Exam, POCT glycosylated hemoglobin (Hb A1C), COMPLETE METABOLIC PANEL WITH GFR, Lipid panel  Essential hypertension - Continue current medication. Plan: COMPLETE METABOLIC PANEL WITH GFR, CBC with Differential  Hypogonadism male - Pt not interested in resuming medications at this time; he plans to contact Dr. Lucianne Muss for follow-up. Plan: PSA, Testosterone, free, total  Paresthesias- Suspect plantar foot trauma which may be remedied w/ supportive shoe inserts and support hose.  Need for prophylactic vaccination and inoculation against influenza - Plan: Flu Vaccine QUAD 36+ mos IM

## 2014-09-13 LAB — PSA: PSA: 0.19 ng/mL (ref <=4.00)

## 2014-09-15 LAB — TESTOSTERONE, FREE, TOTAL, SHBG
Sex Hormone Binding: 25 nmol/L (ref 13–71)
TESTOSTERONE FREE: 32.9 pg/mL — AB (ref 47.0–244.0)
Testosterone-% Free: 2.2 % (ref 1.6–2.9)
Testosterone: 151 ng/dL — ABNORMAL LOW (ref 300–890)

## 2014-09-16 ENCOUNTER — Other Ambulatory Visit: Payer: Self-pay | Admitting: Physician Assistant

## 2014-09-16 ENCOUNTER — Other Ambulatory Visit: Payer: Self-pay | Admitting: Family Medicine

## 2014-10-02 ENCOUNTER — Other Ambulatory Visit: Payer: Self-pay | Admitting: Physician Assistant

## 2014-10-02 ENCOUNTER — Other Ambulatory Visit: Payer: Self-pay | Admitting: Family Medicine

## 2014-10-14 ENCOUNTER — Other Ambulatory Visit: Payer: Self-pay | Admitting: Physician Assistant

## 2014-12-08 ENCOUNTER — Other Ambulatory Visit: Payer: Self-pay | Admitting: Physician Assistant

## 2015-01-08 ENCOUNTER — Other Ambulatory Visit: Payer: Self-pay | Admitting: Family Medicine

## 2015-01-16 ENCOUNTER — Ambulatory Visit (INDEPENDENT_AMBULATORY_CARE_PROVIDER_SITE_OTHER): Payer: PRIVATE HEALTH INSURANCE | Admitting: Family Medicine

## 2015-01-16 ENCOUNTER — Encounter: Payer: Self-pay | Admitting: Family Medicine

## 2015-01-16 VITALS — BP 140/66 | HR 88 | Temp 98.6°F | Resp 16 | Ht 70.0 in | Wt 260.4 lb

## 2015-01-16 DIAGNOSIS — IMO0002 Reserved for concepts with insufficient information to code with codable children: Secondary | ICD-10-CM

## 2015-01-16 DIAGNOSIS — Z23 Encounter for immunization: Secondary | ICD-10-CM

## 2015-01-16 DIAGNOSIS — E1165 Type 2 diabetes mellitus with hyperglycemia: Secondary | ICD-10-CM

## 2015-01-16 LAB — POCT GLYCOSYLATED HEMOGLOBIN (HGB A1C): Hemoglobin A1C: 8

## 2015-01-16 MED ORDER — "INSULIN SYRINGE-NEEDLE U-100 31G X 5/16"" 1 ML MISC": Status: DC

## 2015-01-16 NOTE — Patient Instructions (Signed)
Your A1c is much better; continue with current management (better nutrition and weight loss). You should be rechecked in 3-4 months. Your annual complete physical exam is due in November 2016 (after Nov 13th).

## 2015-01-20 NOTE — Progress Notes (Signed)
S:  This 64 y.o. Male has Type II DM, last A1c= 9.0% in Nov 2015. He has made substantial changes in lifestyle and weight is down 5 lbs. FSBS in mid-100s w/o hypoglycemia. Pt requests shorter Insulin needles. He is compliant w/ all other medications w/o adverse effects.  Patient Active Problem List   Diagnosis Date Noted  . Erectile dysfunction 10/02/2013  . Hypogonadism male 10/02/2013  . Type II or unspecified type diabetes mellitus without mention of complication, uncontrolled 05/23/2012  . Hyperlipidemia 05/23/2012  . Obesity, Class II, BMI 35-39.9, with comorbidity 05/23/2012  . Depression 05/23/2012  . Tobacco user 05/23/2012  . History of stroke without residual deficits 05/23/2012    Prior to Admission medications   Medication Sig Start Date End Date Taking? Authorizing Provider  amLODipine (NORVASC) 5 MG tablet TAKE 1 TABLET BY MOUTH EVERY DAY 10/16/14  Yes Maurice March, MD  atorvastatin (LIPITOR) 10 MG tablet Take 1 tablet (10 mg total) by mouth daily. 10/16/14  Yes Maurice March, MD  buPROPion (WELLBUTRIN XL) 300 MG 24 hr tablet Take 1 tablet (300 mg total) by mouth daily. 02/14/14  Yes Maurice March, MD  clopidogrel (PLAVIX) 75 MG tablet TAKE 1 TABLET (75 MG TOTAL) BY MOUTH DAILY. 10/05/14  Yes Maurice March, MD  insulin aspart (NOVOLOG) 100 UNIT/ML injection Inject 15 Units into the skin 3 (three) times daily with meals. Inject 30 mg tid   Yes Maurice March, MD  insulin detemir (LEVEMIR) 100 UNIT/ML injection Inject 1 ml (100 units total) into the skin 2 (two) times daily 12/08/14  Yes Chelle S Jeffery, PA-C  mometasone (NASONEX) 50 MCG/ACT nasal spray Place 2 sprays into the nose daily. 07/12/13  Yes Maurice March, MD  sildenafil (REVATIO) 20 MG tablet Take 1 tablet (20 mg total) by mouth as directed. 10/02/13  Yes Reather Littler, MD  sitaGLIPtin-metformin (JANUMET) 50-1000 MG per tablet Take 1 tablet by mouth 2 (two) times daily with a meal. PATIENT  NEEDS OFFICE VISIT FOR ADDITIONAL REFILLS 01/08/15  Yes Maurice March, MD  STENDRA 200 MG TABS TAKE 1 TABLET BY MOTH AS NEEDED   Yes Reather Littler, MD  traZODone (DESYREL) 100 MG tablet TAKE 1 TABLET BY MOUTH AT BEDTIME 09/16/14  Yes Morrell Riddle, PA-C  Testosterone (FORTESTA) 10 MG/ACT (2%) GEL Apply 2 pumps on each thigh daily Patient not taking: Reported on 01/16/2015 10/18/13   Reather Littler, MD    History   Social History  . Marital Status: Married    Spouse Name: N/A  . Number of Children: 3  . 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 Smoking status: Former Smoker -- 0.50 packs/day for 45 years    Types: Cigarettes    Quit date: 07/05/2013  . Smokeless tobacco: Never Used     Comment: quit on 07/05/2013  . Alcohol Use: 1.2 oz/week    2 Cans of beer per week  . Drug Use: No  . Sexual Activity: Yes   Other Topics Concern  . Not on file   Social History Narrative    Family History  Problem Relation Age of Onset  . Diabetes Mother   . Diabetes Brother     ROS: Negative for abnormal weight loss, anorexia, diaphoresis, CP or tightness, palpitations, GI upset, SOB or DOE, HA, dizziness, numbness, weakness or syncope.  OCeasar Mons Vitals:   01/16/15 1038  BP: 140/66  Pulse: 88  Temp: 98.6 F (37 C)  Resp: 16   GEN: In NAD; WN,WD. HENT: [place]/AT; EOMI w/ clear conj/sclerae. Otherwise unremarkable. COR: RRR. LUNGS: Normal resp rate and effort. SKIN: W&D; intact w/o  Diaphoresis, erythema or jaundice. Foot exam unremarkable; see DM FOOT EXAM. NEURO: A&O x 3; CNs intact. Nonfocal.  Results for orders placed or performed in visit on 01/16/15  POCT glycosylated hemoglobin (Hb A1C)  Result Value Ref Range   Hemoglobin A1C 8.0     A/P: Type II diabetes mellitus, uncontrolled - Improved glycemic control. No change in medications; shorter insulin needle prescribed per pt request. Plan: HM Diabetes Foot Exam,  POCT glycosylated hemoglobin (Hb A1C)  Need for Tdap vaccination - Plan: Tdap vaccine greater than or equal to 7yo IM  Meds ordered this encounter  Medications  . Insulin Syringe-Needle U-100 (B-D INS SYR ULTRAFINE 1CC/31G) 31G X 5/16" 1 ML MISC    Sig: Use as directed 4-6 times daily.    Dispense:  600 each    Refill:  5

## 2015-02-09 ENCOUNTER — Other Ambulatory Visit: Payer: Self-pay | Admitting: Family Medicine

## 2015-02-19 ENCOUNTER — Other Ambulatory Visit: Payer: Self-pay | Admitting: Family Medicine

## 2015-02-19 NOTE — Telephone Encounter (Signed)
Dr Audria NineMcPherson, pt has appt on 05/22/15. Can we RF his wellbutrin until then?

## 2015-03-13 ENCOUNTER — Other Ambulatory Visit: Payer: Self-pay | Admitting: Family Medicine

## 2015-03-18 ENCOUNTER — Other Ambulatory Visit: Payer: Self-pay | Admitting: Physician Assistant

## 2015-03-23 ENCOUNTER — Other Ambulatory Visit: Payer: Self-pay | Admitting: Physician Assistant

## 2015-03-23 ENCOUNTER — Other Ambulatory Visit: Payer: Self-pay | Admitting: Family Medicine

## 2015-03-24 NOTE — Telephone Encounter (Signed)
I refilled this chronic medication for 6 months.

## 2015-03-24 NOTE — Telephone Encounter (Signed)
Dr Audria NineMcPherson, you saw pt in March and advised to RTC in 3-4 mos. I don't see this med discussed though. Is it OK to RF until appt due?

## 2015-03-25 ENCOUNTER — Other Ambulatory Visit: Payer: Self-pay | Admitting: Family Medicine

## 2015-04-21 ENCOUNTER — Other Ambulatory Visit: Payer: Self-pay | Admitting: Endocrinology

## 2015-04-21 ENCOUNTER — Other Ambulatory Visit: Payer: Self-pay | Admitting: Physician Assistant

## 2015-04-27 ENCOUNTER — Other Ambulatory Visit: Payer: Self-pay | Admitting: Family Medicine

## 2015-05-18 ENCOUNTER — Other Ambulatory Visit: Payer: Self-pay | Admitting: Family Medicine

## 2015-05-21 DIAGNOSIS — I10 Essential (primary) hypertension: Secondary | ICD-10-CM | POA: Insufficient documentation

## 2015-05-21 NOTE — Progress Notes (Deleted)
Pt of my prior colleague Dr. Audria Nine, last seen 4 mos prior for f/u on his uncontrolled type II IDDM.  Has a1c was 9.0% in Nov 2015 -> 8.0% March 2016 after tlc and 5 lb weight loss.  He was using novolog 15-30u tid with Levemir 100u bid as well as janumet max dose 50-1000 bid.  Pt saw opthomology 07/2014. Monofil done 12/2014. Had pneumovax 03/2011. Will need prevnar after 10/2016.  Pt is seen my Dr. Lucianne Muss, endocrinology, for hypogonadism on testosterone gel supp 2 pumps/d but last OV with endocrine was 18 mos prior.  Testosterone level last checked 8 mos prior and was low at 151. Had nml PSA 8 mos prior.  He wsa initially started on a patch but causes rash despite good symptomatic response to replacement.  Dr. Lucianne Muss kindly offered to take over pt's DM care but needed referral for this from pt's PCP which was not placed.  HPL - goal LDL <70 due to DM and h/o CVA on lipitor 10.  On plavix from prior CVA.  Last lipid panel 9 mos prior was at goal with LDL 36 and non-hdl of 76.  BP was moderately elev at last OV - on amlodipine 5  He will be due for CPE after 09/13/15.  Needs zostavax?  Stopped smoking 2 yrs prior - qualify for low dose chest CT for screening?

## 2015-05-22 ENCOUNTER — Ambulatory Visit (INDEPENDENT_AMBULATORY_CARE_PROVIDER_SITE_OTHER): Payer: PRIVATE HEALTH INSURANCE | Admitting: Family Medicine

## 2015-05-22 ENCOUNTER — Encounter: Payer: Self-pay | Admitting: Family Medicine

## 2015-05-22 VITALS — BP 125/64 | HR 85 | Temp 98.4°F | Resp 16 | Wt 262.2 lb

## 2015-05-22 DIAGNOSIS — Z794 Long term (current) use of insulin: Secondary | ICD-10-CM | POA: Diagnosis not present

## 2015-05-22 DIAGNOSIS — G629 Polyneuropathy, unspecified: Secondary | ICD-10-CM

## 2015-05-22 DIAGNOSIS — E785 Hyperlipidemia, unspecified: Secondary | ICD-10-CM

## 2015-05-22 DIAGNOSIS — B353 Tinea pedis: Secondary | ICD-10-CM | POA: Diagnosis not present

## 2015-05-22 DIAGNOSIS — E291 Testicular hypofunction: Secondary | ICD-10-CM

## 2015-05-22 DIAGNOSIS — I10 Essential (primary) hypertension: Secondary | ICD-10-CM

## 2015-05-22 DIAGNOSIS — Z79899 Other long term (current) drug therapy: Secondary | ICD-10-CM

## 2015-05-22 DIAGNOSIS — S7402XA Injury of sciatic nerve at hip and thigh level, left leg, initial encounter: Secondary | ICD-10-CM | POA: Diagnosis not present

## 2015-05-22 DIAGNOSIS — B351 Tinea unguium: Secondary | ICD-10-CM

## 2015-05-22 DIAGNOSIS — E1142 Type 2 diabetes mellitus with diabetic polyneuropathy: Secondary | ICD-10-CM | POA: Diagnosis not present

## 2015-05-22 DIAGNOSIS — E1165 Type 2 diabetes mellitus with hyperglycemia: Secondary | ICD-10-CM | POA: Diagnosis not present

## 2015-05-22 DIAGNOSIS — IMO0002 Reserved for concepts with insufficient information to code with codable children: Secondary | ICD-10-CM

## 2015-05-22 LAB — COMPREHENSIVE METABOLIC PANEL
ALK PHOS: 75 U/L (ref 39–117)
ALT: 35 U/L (ref 0–53)
AST: 50 U/L — ABNORMAL HIGH (ref 0–37)
Albumin: 4.3 g/dL (ref 3.5–5.2)
BUN: 12 mg/dL (ref 6–23)
CO2: 25 mEq/L (ref 19–32)
CREATININE: 0.99 mg/dL (ref 0.50–1.35)
Calcium: 9.7 mg/dL (ref 8.4–10.5)
Chloride: 98 mEq/L (ref 96–112)
Glucose, Bld: 175 mg/dL — ABNORMAL HIGH (ref 70–99)
POTASSIUM: 4.7 meq/L (ref 3.5–5.3)
Sodium: 137 mEq/L (ref 135–145)
Total Bilirubin: 0.4 mg/dL (ref 0.2–1.2)
Total Protein: 7.5 g/dL (ref 6.0–8.3)

## 2015-05-22 LAB — MICROALBUMIN / CREATININE URINE RATIO
Creatinine, Urine: 56.6 mg/dL
Microalb Creat Ratio: 14.1 mg/g (ref 0.0–30.0)
Microalb, Ur: 0.8 mg/dL (ref <2.0)

## 2015-05-22 LAB — LIPID PANEL
CHOL/HDL RATIO: 2.7 ratio
Cholesterol: 155 mg/dL (ref 0–200)
HDL: 58 mg/dL (ref >=40)
LDL Cholesterol: 41 mg/dL (ref 0–99)
TRIGLYCERIDES: 280 mg/dL — AB (ref <150)
VLDL: 56 mg/dL — ABNORMAL HIGH (ref 0–40)

## 2015-05-22 LAB — POCT GLYCOSYLATED HEMOGLOBIN (HGB A1C): HEMOGLOBIN A1C: 8

## 2015-05-22 MED ORDER — MELOXICAM 7.5 MG PO TABS: 7.5000 mg | ORAL_TABLET | Freq: Two times a day (BID) | ORAL | Status: DC

## 2015-05-22 MED ORDER — INSULIN ASPART 100 UNIT/ML ~~LOC~~ SOLN: SUBCUTANEOUS | Status: DC

## 2015-05-22 MED ORDER — BUPROPION HCL ER (XL) 300 MG PO TB24: 300.0000 mg | ORAL_TABLET | Freq: Every day | ORAL | Status: DC

## 2015-05-22 MED ORDER — INSULIN DETEMIR 100 UNIT/ML ~~LOC~~ SOLN: SUBCUTANEOUS | Status: DC

## 2015-05-22 MED ORDER — SITAGLIPTIN PHOS-METFORMIN HCL 50-1000 MG PO TABS: 1.0000 | ORAL_TABLET | Freq: Two times a day (BID) | ORAL | Status: DC

## 2015-05-22 MED ORDER — ATORVASTATIN CALCIUM 10 MG PO TABS: 10.0000 mg | ORAL_TABLET | Freq: Every day | ORAL | Status: DC

## 2015-05-22 MED ORDER — AMLODIPINE BESYLATE 5 MG PO TABS: 5.0000 mg | ORAL_TABLET | Freq: Every day | ORAL | Status: DC

## 2015-05-22 MED ORDER — TRAZODONE HCL 100 MG PO TABS: ORAL_TABLET | ORAL | Status: DC

## 2015-05-22 MED ORDER — CLOTRIMAZOLE 1 % EX CREA: 1.0000 "application " | TOPICAL_CREAM | Freq: Two times a day (BID) | CUTANEOUS | Status: DC

## 2015-05-22 MED ORDER — CLOPIDOGREL BISULFATE 75 MG PO TABS: 75.0000 mg | ORAL_TABLET | Freq: Every day | ORAL | Status: DC

## 2015-05-22 NOTE — Patient Instructions (Addendum)
Gentle stretch, ice or heat, return to clinic for further eval if still having pain in a month or at any time worsening or developing any weakness or numbness in leg.  Sciatica with Rehab The sciatic nerve runs from the back down the leg and is responsible for sensation and control of the muscles in the back (posterior) side of the thigh, lower leg, and foot. Sciatica is a condition that is characterized by inflammation of this nerve.  SYMPTOMS   Signs of nerve damage, including numbness and/or weakness along the posterior side of the lower extremity.  Pain in the back of the thigh that may also travel down the leg.  Pain that worsens when sitting for long periods of time.  Occasionally, pain in the back or buttock. CAUSES  Inflammation of the sciatic nerve is the cause of sciatica. The inflammation is due to something irritating the nerve. Common sources of irritation include:  Sitting for long periods of time.  Direct trauma to the nerve.  Arthritis of the spine.  Herniated or ruptured disk.  Slipping of the vertebrae (spondylolisthesis).  Pressure from soft tissues, such as muscles or ligament-like tissue (fascia). RISK INCREASES WITH:  Sports that place pressure or stress on the spine (football or weightlifting).  Poor strength and flexibility.  Failure to warm up properly before activity.  Family history of low back pain or disk disorders.  Previous back injury or surgery.  Poor body mechanics, especially when lifting, or poor posture. PREVENTION   Warm up and stretch properly before activity.  Maintain physical fitness:  Strength, flexibility, and endurance.  Cardiovascular fitness.  Learn and use proper technique, especially with posture and lifting. When possible, have coach correct improper technique.  Avoid activities that place stress on the spine. PROGNOSIS If treated properly, then sciatica usually resolves within 6 weeks. However, occasionally  surgery is necessary.  RELATED COMPLICATIONS   Permanent nerve damage, including pain, numbness, tingle, or weakness.  Chronic back pain.  Risks of surgery: infection, bleeding, nerve damage, or damage to surrounding tissues. TREATMENT Treatment initially involves resting from any activities that aggravate your symptoms. The use of ice and medication may help reduce pain and inflammation. The use of strengthening and stretching exercises may help reduce pain with activity. These exercises may be performed at home or with referral to a therapist. A therapist may recommend further treatments, such as transcutaneous electronic nerve stimulation (TENS) or ultrasound. Your caregiver may recommend corticosteroid injections to help reduce inflammation of the sciatic nerve. If symptoms persist despite non-surgical (conservative) treatment, then surgery may be recommended. MEDICATION  If pain medication is necessary, then nonsteroidal anti-inflammatory medications, such as aspirin and ibuprofen, or other minor pain relievers, such as acetaminophen, are often recommended.  Do not take pain medication for 7 days before surgery.  Prescription pain relievers may be given if deemed necessary by your caregiver. Use only as directed and only as much as you need.  Ointments applied to the skin may be helpful.  Corticosteroid injections may be given by your caregiver. These injections should be reserved for the most serious cases, because they may only be given a certain number of times. HEAT AND COLD  Cold treatment (icing) relieves pain and reduces inflammation. Cold treatment should be applied for 10 to 15 minutes every 2 to 3 hours for inflammation and pain and immediately after any activity that aggravates your symptoms. Use ice packs or massage the area with a piece of ice (ice massage).  Heat  treatment may be used prior to performing the stretching and strengthening activities prescribed by your  caregiver, physical therapist, or athletic trainer. Use a heat pack or soak the injury in warm water. SEEK MEDICAL CARE IF:  Treatment seems to offer no benefit, or the condition worsens.  Any medications produce adverse side effects. EXERCISES  RANGE OF MOTION (ROM) AND STRETCHING EXERCISES - Sciatica Most people with sciatic will find that their symptoms worsen with either excessive bending forward (flexion) or arching at the low back (extension). The exercises which will help resolve your symptoms will focus on the opposite motion. Your physician, physical therapist or athletic trainer will help you determine which exercises will be most helpful to resolve your low back pain. Do not complete any exercises without first consulting with your clinician. Discontinue any exercises which worsen your symptoms until you speak to your clinician. If you have pain, numbness or tingling which travels down into your buttocks, leg or foot, the goal of the therapy is for these symptoms to move closer to your back and eventually resolve. Occasionally, these leg symptoms will get better, but your low back pain may worsen; this is typically an indication of progress in your rehabilitation. Be certain to be very alert to any changes in your symptoms and the activities in which you participated in the 24 hours prior to the change. Sharing this information with your clinician will allow him/her to most efficiently treat your condition. These exercises may help you when beginning to rehabilitate your injury. Your symptoms may resolve with or without further involvement from your physician, physical therapist or athletic trainer. While completing these exercises, remember:   Restoring tissue flexibility helps normal motion to return to the joints. This allows healthier, less painful movement and activity.  An effective stretch should be held for at least 30 seconds.  A stretch should never be painful. You should only  feel a gentle lengthening or release in the stretched tissue. FLEXION RANGE OF MOTION AND STRETCHING EXERCISES: STRETCH - Flexion, Single Knee to Chest   Lie on a firm bed or floor with both legs extended in front of you.  Keeping one leg in contact with the floor, bring your opposite knee to your chest. Hold your leg in place by either grabbing behind your thigh or at your knee.  Pull until you feel a gentle stretch in your low back. Hold __________ seconds.  Slowly release your grasp and repeat the exercise with the opposite side. Repeat __________ times. Complete this exercise __________ times per day.  STRETCH - Flexion, Double Knee to Chest  Lie on a firm bed or floor with both legs extended in front of you.  Keeping one leg in contact with the floor, bring your opposite knee to your chest.  Tense your stomach muscles to support your back and then lift your other knee to your chest. Hold your legs in place by either grabbing behind your thighs or at your knees.  Pull both knees toward your chest until you feel a gentle stretch in your low back. Hold __________ seconds.  Tense your stomach muscles and slowly return one leg at a time to the floor. Repeat __________ times. Complete this exercise __________ times per day.  STRETCH - Low Trunk Rotation   Lie on a firm bed or floor. Keeping your legs in front of you, bend your knees so they are both pointed toward the ceiling and your feet are flat on the floor.  Extend your  arms out to the side. This will stabilize your upper body by keeping your shoulders in contact with the floor.  Gently and slowly drop both knees together to one side until you feel a gentle stretch in your low back. Hold for __________ seconds.  Tense your stomach muscles to support your low back as you bring your knees back to the starting position. Repeat the exercise to the other side. Repeat __________ times. Complete this exercise __________ times per day    EXTENSION RANGE OF MOTION AND FLEXIBILITY EXERCISES: STRETCH - Extension, Prone on Elbows  Lie on your stomach on the floor, a bed will be too soft. Place your palms about shoulder width apart and at the height of your head.  Place your elbows under your shoulders. If this is too painful, stack pillows under your chest.  Allow your body to relax so that your hips drop lower and make contact more completely with the floor.  Hold this position for __________ seconds.  Slowly return to lying flat on the floor. Repeat __________ times. Complete this exercise __________ times per day.  RANGE OF MOTION - Extension, Prone Press Ups  Lie on your stomach on the floor, a bed will be too soft. Place your palms about shoulder width apart and at the height of your head.  Keeping your back as relaxed as possible, slowly straighten your elbows while keeping your hips on the floor. You may adjust the placement of your hands to maximize your comfort. As you gain motion, your hands will come more underneath your shoulders.  Hold this position __________ seconds.  Slowly return to lying flat on the floor. Repeat __________ times. Complete this exercise __________ times per day.  STRENGTHENING EXERCISES - Sciatica  These exercises may help you when beginning to rehabilitate your injury. These exercises should be done near your "sweet spot." This is the neutral, low-back arch, somewhere between fully rounded and fully arched, that is your least painful position. When performed in this safe range of motion, these exercises can be used for people who have either a flexion or extension based injury. These exercises may resolve your symptoms with or without further involvement from your physician, physical therapist or athletic trainer. While completing these exercises, remember:   Muscles can gain both the endurance and the strength needed for everyday activities through controlled exercises.  Complete these  exercises as instructed by your physician, physical therapist or athletic trainer. Progress with the resistance and repetition exercises only as your caregiver advises.  You may experience muscle soreness or fatigue, but the pain or discomfort you are trying to eliminate should never worsen during these exercises. If this pain does worsen, stop and make certain you are following the directions exactly. If the pain is still present after adjustments, discontinue the exercise until you can discuss the trouble with your clinician. STRENGTHENING - Deep Abdominals, Pelvic Tilt   Lie on a firm bed or floor. Keeping your legs in front of you, bend your knees so they are both pointed toward the ceiling and your feet are flat on the floor.  Tense your lower abdominal muscles to press your low back into the floor. This motion will rotate your pelvis so that your tail bone is scooping upwards rather than pointing at your feet or into the floor.  With a gentle tension and even breathing, hold this position for __________ seconds. Repeat __________ times. Complete this exercise __________ times per day.  STRENGTHENING - Abdominals, Crunches  Lie on a firm bed or floor. Keeping your legs in front of you, bend your knees so they are both pointed toward the ceiling and your feet are flat on the floor. Cross your arms over your chest.  Slightly tip your chin down without bending your neck.  Tense your abdominals and slowly lift your trunk high enough to just clear your shoulder blades. Lifting higher can put excessive stress on the low back and does not further strengthen your abdominal muscles.  Control your return to the starting position. Repeat __________ times. Complete this exercise __________ times per day.  STRENGTHENING - Quadruped, Opposite UE/LE Lift  Assume a hands and knees position on a firm surface. Keep your hands under your shoulders and your knees under your hips. You may place padding under  your knees for comfort.  Find your neutral spine and gently tense your abdominal muscles so that you can maintain this position. Your shoulders and hips should form a rectangle that is parallel with the floor and is not twisted.  Keeping your trunk steady, lift your right hand no higher than your shoulder and then your left leg no higher than your hip. Make sure you are not holding your breath. Hold this position __________ seconds.  Continuing to keep your abdominal muscles tense and your back steady, slowly return to your starting position. Repeat with the opposite arm and leg. Repeat __________ times. Complete this exercise __________ times per day.  STRENGTHENING - Abdominals and Quadriceps, Straight Leg Raise   Lie on a firm bed or floor with both legs extended in front of you.  Keeping one leg in contact with the floor, bend the other knee so that your foot can rest flat on the floor.  Find your neutral spine, and tense your abdominal muscles to maintain your spinal position throughout the exercise.  Slowly lift your straight leg off the floor about 6 inches for a count of 15, making sure to not hold your breath.  Still keeping your neutral spine, slowly lower your leg all the way to the floor. Repeat this exercise with each leg __________ times. Complete this exercise __________ times per day. POSTURE AND BODY MECHANICS CONSIDERATIONS - Sciatica Keeping correct posture when sitting, standing or completing your activities will reduce the stress put on different body tissues, allowing injured tissues a chance to heal and limiting painful experiences. The following are general guidelines for improved posture. Your physician or physical therapist will provide you with any instructions specific to your needs. While reading these guidelines, remember:  The exercises prescribed by your provider will help you have the flexibility and strength to maintain correct postures.  The correct posture  provides the optimal environment for your joints to work. All of your joints have less wear and tear when properly supported by a spine with good posture. This means you will experience a healthier, less painful body.  Correct posture must be practiced with all of your activities, especially prolonged sitting and standing. Correct posture is as important when doing repetitive low-stress activities (typing) as it is when doing a single heavy-load activity (lifting). RESTING POSITIONS Consider which positions are most painful for you when choosing a resting position. If you have pain with flexion-based activities (sitting, bending, stooping, squatting), choose a position that allows you to rest in a less flexed posture. You would want to avoid curling into a fetal position on your side. If your pain worsens with extension-based activities (prolonged standing, working overhead), avoid resting  in an extended position such as sleeping on your stomach. Most people will find more comfort when they rest with their spine in a more neutral position, neither too rounded nor too arched. Lying on a non-sagging bed on your side with a pillow between your knees, or on your back with a pillow under your knees will often provide some relief. Keep in mind, being in any one position for a prolonged period of time, no matter how correct your posture, can still lead to stiffness. PROPER SITTING POSTURE In order to minimize stress and discomfort on your spine, you must sit with correct posture Sitting with good posture should be effortless for a healthy body. Returning to good posture is a gradual process. Many people can work toward this most comfortably by using various supports until they have the flexibility and strength to maintain this posture on their own. When sitting with proper posture, your ears will fall over your shoulders and your shoulders will fall over your hips. You should use the back of the chair to support  your upper back. Your low back will be in a neutral position, just slightly arched. You may place a small pillow or folded towel at the base of your low back for support.  When working at a desk, create an environment that supports good, upright posture. Without extra support, muscles fatigue and lead to excessive strain on joints and other tissues. Keep these recommendations in mind: CHAIR:   A chair should be able to slide under your desk when your back makes contact with the back of the chair. This allows you to work closely.  The chair's height should allow your eyes to be level with the upper part of your monitor and your hands to be slightly lower than your elbows. BODY POSITION  Your feet should make contact with the floor. If this is not possible, use a foot rest.  Keep your ears over your shoulders. This will reduce stress on your neck and low back. INCORRECT SITTING POSTURES   If you are feeling tired and unable to assume a healthy sitting posture, do not slouch or slump. This puts excessive strain on your back tissues, causing more damage and pain. Healthier options include:  Using more support, like a lumbar pillow.  Switching tasks to something that requires you to be upright or walking.  Talking a brief walk.  Lying down to rest in a neutral-spine position. PROLONGED STANDING WHILE SLIGHTLY LEANING FORWARD  When completing a task that requires you to lean forward while standing in one place for a long time, place either foot up on a stationary 2-4 inch high object to help maintain the best posture. When both feet are on the ground, the low back tends to lose its slight inward curve. If this curve flattens (or becomes too large), then the back and your other joints will experience too much stress, fatigue more quickly and can cause pain.  CORRECT STANDING POSTURES Proper standing posture should be assumed with all daily activities, even if they only take a few moments, like  when brushing your teeth. As in sitting, your ears should fall over your shoulders and your shoulders should fall over your hips. You should keep a slight tension in your abdominal muscles to brace your spine. Your tailbone should point down to the ground, not behind your body, resulting in an over-extended swayback posture.  INCORRECT STANDING POSTURES  Common incorrect standing postures include a forward head, locked knees and/or an  excessive swayback. WALKING Walk with an upright posture. Your ears, shoulders and hips should all line-up. PROLONGED ACTIVITY IN A FLEXED POSITION When completing a task that requires you to bend forward at your waist or lean over a low surface, try to find a way to stabilize 3 of 4 of your limbs. You can place a hand or elbow on your thigh or rest a knee on the surface you are reaching across. This will provide you more stability so that your muscles do not fatigue as quickly. By keeping your knees relaxed, or slightly bent, you will also reduce stress across your low back. CORRECT LIFTING TECHNIQUES DO :   Assume a wide stance. This will provide you more stability and the opportunity to get as close as possible to the object which you are lifting.  Tense your abdominals to brace your spine; then bend at the knees and hips. Keeping your back locked in a neutral-spine position, lift using your leg muscles. Lift with your legs, keeping your back straight.  Test the weight of unknown objects before attempting to lift them.  Try to keep your elbows locked down at your sides in order get the best strength from your shoulders when carrying an object.  Always ask for help when lifting heavy or awkward objects. INCORRECT LIFTING TECHNIQUES DO NOT:   Lock your knees when lifting, even if it is a small object.  Bend and twist. Pivot at your feet or move your feet when needing to change directions.  Assume that you cannot safely pick up a paperclip without proper  posture. Document Released: 10/17/2005 Document Revised: 03/03/2014 Document Reviewed: 01/29/2009 [facility] Patient Information 2015 [place], [place]. This information is not intended to replace advice given to you by your health care provider. Make sure you discuss any questions you have with your health care provider.  Athlete's Foot Athlete's foot (tinea pedis) is a fungal infection of the skin on the feet. It often occurs on the skin between the toes or underneath the toes. It can also occur on the soles of the feet. Athlete's foot is more likely to occur in hot, humid weather. Not washing your feet or changing your socks often enough can contribute to athlete's foot. The infection can spread from person to person (contagious). CAUSES Athlete's foot is caused by a fungus. This fungus thrives in warm, moist places. Most people get athlete's foot by sharing shower stalls, towels, and wet floors with an infected person. People with weakened immune systems, including those with diabetes, may be more likely to get athlete's foot. SYMPTOMS   Itchy areas between the toes or on the soles of the feet.  White, flaky, or scaly areas between the toes or on the soles of the feet.  Tiny, intensely itchy blisters between the toes or on the soles of the feet.  Tiny cuts on the skin. These cuts can develop a bacterial infection.  Thick or discolored toenails. DIAGNOSIS  Your caregiver can usually tell what the problem is by doing a physical exam. Your caregiver may also take a skin sample from the rash area. The skin sample may be examined under a microscope, or it may be tested to see if fungus will grow in the sample. A sample may also be taken from your toenail for testing. TREATMENT  Over-the-counter and prescription medicines can be used to kill the fungus. These medicines are available as powders or creams. Your caregiver can suggest medicines for you. Fungal infections respond slowly to  treatment. You  may need to continue using your medicine for several weeks. PREVENTION   Do not share towels.  Wear sandals in wet areas, such as shared locker rooms and shared showers.  Keep your feet dry. Wear shoes that allow air to circulate. Wear cotton or wool socks. HOME CARE INSTRUCTIONS   Take medicines as directed by your caregiver. Do not use steroid creams on athlete's foot.  Keep your feet clean and cool. Wash your feet daily and dry them thoroughly, especially between your toes.  Change your socks every day. Wear cotton or wool socks. In hot climates, you may need to change your socks 2 to 3 times per day.  Wear sandals or canvas tennis shoes with good air circulation.  If you have blisters, soak your feet in Burow's solution or Epsom salts for 20 to 30 minutes, 2 times a day to dry out the blisters. Make sure you dry your feet thoroughly afterward. SEEK MEDICAL CARE IF:   You have a fever.  You have swelling, soreness, warmth, or redness in your foot.  You are not getting better after 7 days of treatment.  You are not completely cured after 30 days.  You have any problems caused by your medicines. MAKE SURE YOU:   Understand these instructions.  Will watch your condition.  Will get help right away if you are not doing well or get worse. Document Released: 10/14/2000 Document Revised: 01/09/2012 Document Reviewed: 08/05/2011 [facility] Patient Information 2015 [place], [place]. This information is not intended to replace advice given to you by your health care provider. Make sure you discuss any questions you have with your health care provider.  Ringworm, Nail A fungal infection of the nail (tinea unguium/onychomycosis) is common. It is common as the visible part of the nail is composed of dead cells which have no blood supply to help prevent infection. It occurs because fungi are everywhere and will pick any opportunity to grow on any dead material. Because nails are very slow  growing they require up to 2 years of treatment with anti-fungal medications. The entire nail back to the base is infected. This includes approximately  of the nail which you cannot see. If your caregiver has prescribed a medication by mouth, take it every day and as directed. No progress will be seen for at least 6 to 9 months. Do not be disappointed! Because fungi live on dead cells with little or no exposure to blood supply, medication delivery to the infection is slow; thus the cure is slow. It is also why you can observe no progress in the first 6 months. The nail becoming cured is the base of the nail, as it has the blood supply. Topical medication such as creams and ointments are usually not effective. Important in successful treatment of nail fungus is closely following the medication regimen that your doctor prescribes. Sometimes you and your caregiver may elect to speed up this process by surgical removal of all the nails. Even this may still require 6 to 9 months of additional oral medications. See your caregiver as directed. Remember there will be no visible improvement for at least 6 months. See your caregiver sooner if other signs of infection (redness and swelling) develop. Document Released: 10/14/2000 Document Revised: 01/09/2012 Document Reviewed: 12/23/2008 [facility] Patient Information 2015 [place], [place]. This information is not intended to replace advice given to you by your health care provider. Make sure you discuss any questions you have with your health care provider.

## 2015-05-22 NOTE — Progress Notes (Signed)
Subjective:  This chart was scribed for Norberto Sorenson, MD by Andrew Au, ED Scribe. This patient was seen in room 25 and the patient's care was started at 9:08 AM.   Patient ID: Drew Salinas, male    DOB: [birth year], 64 y.o.   MRN: [id number]  HPI Chief Complaint  Patient presents with  . Diabetes    follow up    HPI Comments: Drew Salinas is a 64 y.o. male who presents to the [facility] for a diabetes follow up.   Pt of my prior colleague Dr. Audria Nine, last seen 4 mos prior for f/u on his uncontrolled type II IDDM. Has a1c was 9.0% in Nov 2015 -> 8.0% March 2016 after tlc and 5 lb weight loss. He was using novolog 15-30u tid with Levemir 100u bid as well as janumet max dose 50-1000 bid. Pt saw opthomology 07/2014. Monofil done 12/2014. Had pneumovax 03/2011. Will need prevnar after 10/2016.   Pt is seen my Dr. Lucianne Muss, endocrinology, for hypogonadism on testosterone gel supp 2 pumps/d but last OV with endocrine was 18 mos prior. Testosterone level last checked 8 mos prior and was low at 151. Had nml PSA 8 mos prior. He was initially started on a patch but causes rash despite good symptomatic response to replacement. Dr. Lucianne Muss kindly offered to take over pt's DM care but needed referral for this from pt's PCP which was not placed.   HPL - goal LDL <70 due to DM and h/o CVA on lipitor 10. On plavix from prior CVA. Last lipid panel 9 mos prior was at goal with LDL 36 and non-hdl of 76.   BP was moderately elev at last OV - on amlodipine 5   He will be due for CPE after 09/13/15.   Needs zostavax? Stopped smoking 2 yrs prior - qualify for low dose chest CT for screening?  Diabetes  He monitor sugars at home. They range 100-120 in the morning and 150-160 in the afternoon prior to eating a meal. He is on levemir 100u twice a day and novolog 30-36u depending on his sugar prior to eating. His sugars have not been low in a long time and the highest he's seen lately has been 200. He  denies vision changes. He has some urinary frequency at night and tingling in his toes at night but denies this keeping him up during the night.   Hypogonadism He takes 2 pumps of testosterone, one in each thigh which he started 2 months ago. Initially he was seeing Dr. Lucianne Muss but has not seen him in a while do to medication being stable and having another physician. He is still considering going back to Dr. Lucianne Muss.  Left hip pain He also has left hip pain that radiates down entire leg for the past with associated mild low back pain. This is the first time he's had this problem. He denies weakness and numbness in legs and changes in bladder and bowel.    Past Medical History  Diagnosis Date  . Hypertension   . Hyperlipidemia   . Allergy   . Stroke 1993  . Diabetes mellitus 1995  . GERD (gastroesophageal reflux disease)    Past Surgical History  Procedure Laterality Date  . Appendectomy     Prior to Admission medications   Medication Sig Start Date End Date Taking? Authorizing Provider  amLODipine (NORVASC) 5 MG tablet TAKE 1 TABLET BY MOUTH EVERY DAY 05/19/15  Yes Sherren Mocha, MD  atorvastatin (LIPITOR) 10 MG tablet TAKE 1 TABLET (10 MG TOTAL) BY MOUTH DAILY. 05/19/15  Yes Sherren Mocha, MD  buPROPion (WELLBUTRIN XL) 300 MG 24 hr tablet TAKE 1 TABLET (300 MG TOTAL) BY MOUTH DAILY. 02/19/15  Yes Maurice March, MD  clopidogrel (PLAVIX) 75 MG tablet TAKE 1 TABLET BY MOUTH EVERY DAY 03/14/15  Yes Maurice March, MD  Insulin Syringe-Needle U-100 (B-D INS SYR ULTRAFINE 1CC/31G) 31G X 5/16" 1 ML MISC Use as directed 4-6 times daily. 01/16/15  Yes Maurice March, MD  JANUMET 50-1000 MG per tablet TAKE 1 TABLET BY MOUTH TWICE A DAY WITH MEALS . NEEDS OFFICE VISIT FOR ADDITIONAL REFILLS 02/10/15  Yes Maurice March, MD  LEVEMIR 100 UNIT/ML injection INJECT 1 ML (100 UNITS TOTAL) INTO THE SKIN 2 (TWO) TIMES DAILY 03/19/15  Yes Maurice March, MD  NOVOLOG 100 UNIT/ML injection INJECT  30 UNITS INTO THE SKIN 3 TIMES A DAY WITH MEALS 04/28/15  Yes Morrell Riddle, PA-C  sildenafil (REVATIO) 20 MG tablet Take 1 tablet (20 mg total) by mouth as directed. 10/02/13  Yes Reather Littler, MD  STENDRA 200 MG TABS TAKE 1 TABLET BY MOTH AS NEEDED   Yes Reather Littler, MD  traZODone (DESYREL) 100 MG tablet TAKE 1 TABLET BY MOUTH AT BEDTIME 04/22/15  Yes Chelle Jeffery, PA-C  Testosterone (FORTESTA) 10 MG/ACT (2%) GEL Apply 2 pumps on each thigh daily Patient not taking: Reported on 05/22/2015 10/18/13   Reather Littler, MD   Review of Systems  Eyes: Negative for photophobia and visual disturbance.  Gastrointestinal: Negative for nausea, vomiting, abdominal pain, constipation and blood in stool.  Genitourinary: Positive for frequency. Negative for dysuria, enuresis and difficulty urinating.  Musculoskeletal: Positive for myalgias, back pain and arthralgias.  Skin: Negative for rash.  Neurological: Negative for weakness and numbness.  Psychiatric/Behavioral: Negative for sleep disturbance.       Objective:   Physical Exam  Constitutional: He is oriented to person, place, and time. He appears well-developed and well-nourished. No distress.  HENT:  Head: Normocephalic and atraumatic.  Eyes: Conjunctivae and EOM are normal.  Neck: Neck supple. No thyromegaly present.  Cardiovascular: Normal rate, regular rhythm, S1 normal, S2 normal and normal heart sounds.  Exam reveals no gallop and no friction rub.   No murmur heard. 2+ pedal pulses  Pulmonary/Chest: Effort normal and breath sounds normal. No respiratory distress. He has no wheezes. He has no rales. He exhibits no tenderness.  Musculoskeletal: Normal range of motion.  No LE edema. No tenderness of lumbar spine. No point tenderness process of para spinal muscle. No tenderness to SI joint. No tenderness of trochanter muscle. Normal hip ROM flexion, external and internal rotation. positive straight leg supine.   Lymphadenopathy:    He has no cervical  adenopathy.  Neurological: He is alert and oriented to person, place, and time.  Reflex Scores:      Patellar reflexes are 0 on the right side and 0 on the left side.      Achilles reflexes are 0 on the right side and 0 on the left side. 5/5 LE strength bilaterally. Neg straight leg raise.   Skin: Skin is warm and dry.  Bilateral feet with erythematous cracked skin well moisturized no wounds or ulcer minimal maceration between toes. Medial aspect of toe nails are yellow and thickened.    Psychiatric: He has a normal mood and affect. His behavior is normal.  Nursing note and vitals reviewed.  Filed Vitals:   05/22/15 0903  BP: 125/64  Pulse: 85  Temp: 98.4 F (36.9 C)  TempSrc: Oral  Resp: 16  Weight: 262 lb 3.2 oz (118.933 kg)   Results for orders placed or performed in visit on 05/22/15  POCT glycosylated hemoglobin (Hb A1C)  Result Value Ref Range   Hemoglobin A1C 8.0     Assessment & Plan:     1. Insulin dependent type 2 diabetes mellitus, uncontrolled - under mildly poor control but requiring very high doses of basal and meal-time insulin in addition to max janumet doseand no sig improvement in a1c so will refer pt to Dr. Lucianne Muss for management as he would likely benefit from concentrated insulins, carb counting, sliding scale, etc as pt is clearly intelligent and keeping a close eyes on cbgs but unfortunately for now this has not led any improved a1cs.  2. Hypogonadism male - Ok to refill testosterone for 3 mos supply whenever needed, do not change dose as has been off of it for a while and would like to restart - did help sxs sig.  Was managed prior by Dr. Lucianne Muss so pt encouraged to f/u w/ endocrine for this  3. Hyperlipidemia - refilled lipitor 10  4. Essential hypertension, benign - refilled amlodipine 5  5. Polypharmacy   6. Diabetic peripheral neuropathy associated with type 2 diabetes mellitus   7. Sciatic nerve injury, left, initial encounter  - new, gave instructions  for home exercise w/ gentle stretch, ice/heat. RTC for further eval if sxs still present in 4-6 wks or if sxs worsen at all.  8. Tinea pedis of both feet - start lotrimin bid  9. Onychomycosis - just on part of great nails so try top vics vaporub.    Orders Placed This Encounter  Procedures  . Lipid panel    Order Specific Question:  Has the patient fasted?    Answer:  Yes  . Comprehensive metabolic panel    Order Specific Question:  Has the patient fasted?    Answer:  Yes  . Microalbumin / creatinine urine ratio  . Testosterone, Free, Total, SHBG  . Ambulatory referral to Endocrinology    Referral Priority:  Routine    Referral Type:  Consultation    Referral Reason:  Specialty Services Required    Number of Visits Requested:  1  . POCT glycosylated hemoglobin (Hb A1C)    Meds ordered this encounter  Medications  . clotrimazole (LOTRIMIN) 1 % cream    Sig: Apply 1 application topically 2 (two) times daily. To feet x 2 wks    Dispense:  113 g    Refill:  2  . amLODipine (NORVASC) 5 MG tablet    Sig: Take 1 tablet (5 mg total) by mouth daily.    Dispense:  90 tablet    Refill:  3  . atorvastatin (LIPITOR) 10 MG tablet    Sig: Take 1 tablet (10 mg total) by mouth daily at 6 PM.    Dispense:  90 tablet    Refill:  3  . buPROPion (WELLBUTRIN XL) 300 MG 24 hr tablet    Sig: Take 1 tablet (300 mg total) by mouth daily.    Dispense:  90 tablet    Refill:  3  . clopidogrel (PLAVIX) 75 MG tablet    Sig: Take 1 tablet (75 mg total) by mouth daily.    Dispense:  90 tablet    Refill:  3  . sitaGLIPtin-metformin (JANUMET) 50-1000 MG  per tablet    Sig: Take 1 tablet by mouth 2 (two) times daily with a meal.    Dispense:  180 tablet    Refill:  1  . insulin detemir (LEVEMIR) 100 UNIT/ML injection    Sig: INJECT 1 ML (100 UNITS TOTAL) INTO THE SKIN 2 (TWO) TIMES DAILY    Dispense:  120 mL    Refill:  1  . insulin aspart (NOVOLOG) 100 UNIT/ML injection    Sig: INJECT 30-40 UNITS  INTO THE SKIN 3 TIMES A DAY WITH MEALS by sliding scale    Dispense:  30 mL    Refill:  3  . traZODone (DESYREL) 100 MG tablet    Sig: TAKE 1 TABLET BY MOUTH AT BEDTIME    Dispense:  90 tablet    Refill:  3  . meloxicam (MOBIC) 7.5 MG tablet    Sig: Take 1 tablet (7.5 mg total) by mouth 2 (two) times daily. For left sciatica    Dispense:  60 tablet    Refill:  1    I personally performed the services described in this documentation, which was scribed in my presence. The recorded information has been reviewed and considered, and addended by me as needed.  Norberto Sorenson, MD MPH

## 2015-05-25 LAB — TESTOSTERONE, FREE, TOTAL, SHBG
SEX HORMONE BINDING: 33 nmol/L (ref 22–77)
TESTOSTERONE FREE: 31.6 pg/mL — AB (ref 47.0–244.0)
Testosterone-% Free: 1.9 % (ref 1.6–2.9)
Testosterone: 168 ng/dL — ABNORMAL LOW (ref 300–890)

## 2015-05-31 ENCOUNTER — Other Ambulatory Visit: Payer: Self-pay | Admitting: Family Medicine

## 2015-06-03 ENCOUNTER — Ambulatory Visit (INDEPENDENT_AMBULATORY_CARE_PROVIDER_SITE_OTHER): Payer: PRIVATE HEALTH INSURANCE | Admitting: Endocrinology

## 2015-06-03 ENCOUNTER — Encounter: Payer: Self-pay | Admitting: Endocrinology

## 2015-06-03 ENCOUNTER — Other Ambulatory Visit: Payer: Self-pay | Admitting: *Deleted

## 2015-06-03 VITALS — Ht 70.0 in | Wt 267.2 lb

## 2015-06-03 DIAGNOSIS — E291 Testicular hypofunction: Secondary | ICD-10-CM | POA: Diagnosis not present

## 2015-06-03 DIAGNOSIS — IMO0002 Reserved for concepts with insufficient information to code with codable children: Secondary | ICD-10-CM

## 2015-06-03 DIAGNOSIS — E1165 Type 2 diabetes mellitus with hyperglycemia: Secondary | ICD-10-CM | POA: Diagnosis not present

## 2015-06-03 MED ORDER — TESTOSTERONE 10 MG/ACT (2%) TD GEL: TRANSDERMAL | Status: DC

## 2015-06-03 MED ORDER — INSULIN DEGLUDEC 200 UNIT/ML ~~LOC~~ SOPN: 100.0000 [IU] | PEN_INJECTOR | Freq: Two times a day (BID) | SUBCUTANEOUS | Status: DC

## 2015-06-03 NOTE — Progress Notes (Signed)
Patient ID: Drew Salinas, male   DOB: Jul 06, 1951, 64 y.o.   MRN: [id number]   Chief complaint:  followup consultation for low testosterone in diabetes  History of Present Illness:  DIABETES:  He has previously been managed by his PCP for diabetes but now his PCP has requested a diabetes consultation However today he was seen very briefly as he was scheduled only for a short follow-up for his hypogonadism  Briefly he has been told to have diabetes several years ago and has been on insulin for about 10 years Previously in [place] he was taking large doses of insulin and this has been continued with Levemir 100 units twice a day along with Novolog 30-36 units before meals.  He prefers to take Levemir with a syringe because of needing large doses He has also been taking Janumet for a few years Apparently hasn't been tried on GLP-1 drugs or Invokana  His A1c has been typically consistently high He monitors his blood sugars only in the morning usually with the One Touch monitor and he thinks these are restrained 120+ range.  Did not bring this for download today No overnight hypoglycemia He has not had a consultation with dietitian and several years  Lab Results  Component Value Date   HGBA1C 8.0 05/22/2015   HGBA1C 8.0 01/16/2015   HGBA1C 9.0 09/12/2014   Lab Results  Component Value Date   MICROALBUR 0.8 05/22/2015   LDLCALC 41 05/22/2015   CREATININE 0.99 05/22/2015    HYPOGONADISM: He was seen in consultation  In 9/14 for evaluation of a low testosterone level. This was initially tested because of erectile dysfunction At that time his libido had been fairly good and also did not complain of any unusual fatigue, poor muscle endurance,  lack of motivation or depression Evaluation confirmed a low free testosterone level as well as normal prolactin. LH level was mildly increased  Because of documented hypogonadism he was started on Androderm 4 mg patch  However because of  skin irritation with the patch and inconsistent adherence he was changed to AndroGel Apparently in 2015 this was changed to [place] by his PCP and he has not been seen in follow-up since 12/2013  Testosterone levels: Baseline 151, after starting AndroGel 420 in 12/14  Subjectively with testosterone supplementation he previously had more energy level and mood is improved. However has not taken any testosterone supplements and over a year and does not know why Has follow-up testosterone is now low as expected  Lab Results  Component Value Date   TESTOSTERONE 168* 05/22/2015   ]     Medication List       This list is accurate as of: 06/03/15  9:14 PM.  Always use your most recent med list.               amLODipine 5 MG tablet  Commonly known as:  NORVASC  Take 1 tablet (5 mg total) by mouth daily.     atorvastatin 10 MG tablet  Commonly known as:  LIPITOR  Take 1 tablet (10 mg total) by mouth daily at 6 PM.     buPROPion 300 MG 24 hr tablet  Commonly known as:  WELLBUTRIN XL  Take 1 tablet (300 mg total) by mouth daily.     clopidogrel 75 MG tablet  Commonly known as:  PLAVIX  Take 1 tablet (75 mg total) by mouth daily.     clotrimazole 1 % cream  Commonly known as:  LOTRIMIN  Apply 1 application topically 2 (two) times daily. To feet x 2 wks     insulin aspart 100 UNIT/ML injection  Commonly known as:  NOVOLOG  INJECT 30-40 UNITS INTO THE SKIN 3 TIMES A DAY WITH MEALS by sliding scale     Insulin Degludec 200 UNIT/ML Sopn  Commonly known as:  TRESIBA FLEXTOUCH  Inject 100 Units into the skin 2 (two) times daily.     insulin detemir 100 UNIT/ML injection  Commonly known as:  LEVEMIR  INJECT 1 ML (100 UNITS TOTAL) INTO THE SKIN 2 (TWO) TIMES DAILY     Insulin Syringe-Needle U-100 31G X 5/16" 1 ML Misc  Commonly known as:  B-D INS SYR ULTRAFINE 1CC/31G  Use as directed 4-6 times daily.     meloxicam 7.5 MG tablet  Commonly known as:  MOBIC  Take 1 tablet  (7.5 mg total) by mouth 2 (two) times daily. For left sciatica     sildenafil 20 MG tablet  Commonly known as:  REVATIO  Take 1 tablet (20 mg total) by mouth as directed.     sitaGLIPtin-metformin 50-1000 MG per tablet  Commonly known as:  JANUMET  TAKE 1 TABLET BY MOUTH TWICE A DAY WITH MEALS.  "OV NEEDED"     STENDRA 200 MG Tabs  Generic drug:  Avanafil  TAKE 1 TABLET BY MOTH AS NEEDED     Testosterone 10 MG/ACT (2%) Gel  Commonly known as:  FORTESTA  Apply 2 pumps on each thigh daily     traZODone 100 MG tablet  Commonly known as:  DESYREL  TAKE 1 TABLET BY MOUTH AT BEDTIME        Allergies:  Allergies  Allergen Reactions  . Lisinopril Cough    Past Medical History  Diagnosis Date  . Hypertension   . Hyperlipidemia   . Allergy   . Stroke 1993  . Diabetes mellitus 1995  . GERD (gastroesophageal reflux disease)     Past Surgical History  Procedure Laterality Date  . Appendectomy      Family History  Problem Relation Age of Onset  . Diabetes Mother   . Diabetes Brother     Social History:  reports that he has been smoking Cigarettes.  He has a 22.5 pack-year smoking history. He has never used smokeless tobacco. He reports that he drinks about 1.2 oz of alcohol per week. He reports that he does not use illicit drugs.  REVIEW of systems:  Recently having leg pain intermittently and not improved with taking meloxicam given by PCP.  Not able to walk much  He has history of hyperlipidemia treated with Lipitor  History of hypertension treated with amlodipine   Taking Plavix for history of stroke with no residual deficit  LABS:   Office Visit on 05/22/2015  Component Date Value Ref Range Status  . Cholesterol 05/22/2015 155  0 - 200 mg/dL Final   Comment: ATP III Classification:       < 200        mg/dL        Desirable      161 - 239     mg/dL        Borderline High      >= 240        mg/dL        High     . Triglycerides 05/22/2015 280* <150 mg/dL  Final  . HDL 09/60/4540 58  >=40 mg/dL Final  . Total CHOL/HDL Ratio 05/22/2015  2.7   Final  . VLDL 05/22/2015 56* 0 - 40 mg/dL Final  . LDL Cholesterol 05/22/2015 41  0 - 99 mg/dL Final   Comment:   Total Cholesterol/HDL Ratio:CHD Risk                        Coronary Heart Disease Risk Table                                        Men       Women          1/2 Average Risk              3.4        3.3              Average Risk              5.0        4.4           2X Average Risk              9.6        7.1           3X Average Risk             23.4       11.0 Use the calculated Patient Ratio above and the CHD Risk table  to determine the patient's CHD Risk. ATP III Classification (LDL):       < 100        mg/dL         Optimal      161 - 129     mg/dL         Near or Above Optimal      130 - 159     mg/dL         Borderline High      160 - 189     mg/dL         High       > 096        mg/dL         Very High     . Sodium 05/22/2015 137  135 - 145 mEq/L Final  . Potassium 05/22/2015 4.7  3.5 - 5.3 mEq/L Final  . Chloride 05/22/2015 98  96 - 112 mEq/L Final  . CO2 05/22/2015 25  19 - 32 mEq/L Final  . Glucose, Bld 05/22/2015 175* 70 - 99 mg/dL Final  . BUN 04/54/0981 12  6 - 23 mg/dL Final  . Creat 19/14/7829 0.99  0.50 - 1.35 mg/dL Final  . Total Bilirubin 05/22/2015 0.4  0.2 - 1.2 mg/dL Final  . Alkaline Phosphatase 05/22/2015 75  39 - 117 U/L Final  . AST 05/22/2015 50* 0 - 37 U/L Final  . ALT 05/22/2015 35  0 - 53 U/L Final  . Total Protein 05/22/2015 7.5  6.0 - 8.3 g/dL Final  . Albumin 56/21/3086 4.3  3.5 - 5.2 g/dL Final  . Calcium 57/84/6962 9.7  8.4 - 10.5 mg/dL Final  . Hemoglobin X5M 05/22/2015 8.0   Final  . Microalb, Ur 05/22/2015 0.8  <2.0 mg/dL Final   Comment: The ADA (Diabetes Care 2014;37(suppl 1):S14-S80) has defined abnormalities in albumin excretion as follows:            Category  Result                            (mg/g creatinine)                  Normal:    <30       Microalbuminuria:    30 - 299   Clinical albuminuria:    > or = 300    The ADA recommends that at least two of three specimens collected within a 3 - 6 month period be abnormal before considering a patient to be within a diagnostic category.   . Creatinine, Urine 05/22/2015 56.6   Final   No reference range established.  . Microalb Creat Ratio 05/22/2015 14.1  0.0 - 30.0 mg/g Final  . Testosterone 05/22/2015 168* 300 - 890 ng/dL Final   Comment:           Tanner Stage       Male              Male               I              < 30 ng/dL        < 10 ng/dL               II             < 150 ng/dL       < 30 ng/dL               III            100-320 ng/dL     < 35 ng/dL               IV             200-970 ng/dL     16-10 ng/dL               V/Adult        300-890 ng/dL     96-04 ng/dL     . Sex Hormone Binding 05/22/2015 33  22 - 77 nmol/L Final  . Testosterone, Free 05/22/2015 31.6* 47.0 - 244.0 pg/mL Final   Comment:   The concentration of free testosterone is derived from a mathematical expression based on constants for the binding of testosterone to sex hormone-binding globulin and albumin.   . Testosterone-% Free 05/22/2015 1.9  1.6 - 2.9 % Final     General Examination:   Ht  (1.778 m)  Wt 267 lb 3.2 oz (121.201 kg)  BMI 38.34 kg/m2  No ankle edema   Assessment/ Plan:  DIABETES:  He appears to be fairly insulin resistant However although he reports very good blood sugars in the mornings he still has an A1c around 8% indicating postprandial hyperglycemia He is a good candidate for adding an SGLT2 drug to his regimen but this will need to be discussed in detail on his next visit For now he will switch his Levemir to [place] which would be more efficient and he will be able to dial more than 80 units in a single dose with his new insulin pen.  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, likely to be 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 and also had a slightly high baseline FSH level He is again symptomatic and as low levels of testosterone He agrees to restart [place] as before and dose will be adjusted based on follow-up levels   Patient Instructions  Check blood sugars on waking up .Marland Kitchen 3-4 .Marland Kitchen times a week Also check blood sugars about 2 hours after a meal and do this after different meals by rotation  Recommended blood sugar levels on waking up is 90-130 and about 2 hours after meal is 140-180 Please bring blood sugar monitor to each visit.  Start Fortesta gel daily  Start walking or other program when able to  Change to 90 units twice Tresiba  Instead of Levemir   Counseling time on subjects discussed above is over 50% of today's 25 minute visit     [facility]  06/03/2015 9:14 PM

## 2015-06-03 NOTE — Patient Instructions (Addendum)
Check blood sugars on waking up .Marland Kitchen 3-4 .Marland Kitchen times a week Also check blood sugars about 2 hours after a meal and do this after different meals by rotation  Recommended blood sugar levels on waking up is 90-130 and about 2 hours after meal is 140-180 Please bring blood sugar monitor to each visit.  Start Fortesta gel daily  Start walking or other program when able to  Change to 90 units twice [place]  Instead of Levemir

## 2015-06-16 ENCOUNTER — Encounter: Payer: PRIVATE HEALTH INSURANCE | Admitting: Nutrition

## 2015-06-23 ENCOUNTER — Encounter: Payer: PRIVATE HEALTH INSURANCE | Attending: Endocrinology | Admitting: Nutrition

## 2015-06-23 DIAGNOSIS — Z713 Dietary counseling and surveillance: Secondary | ICD-10-CM | POA: Insufficient documentation

## 2015-06-23 DIAGNOSIS — E1142 Type 2 diabetes mellitus with diabetic polyneuropathy: Secondary | ICD-10-CM | POA: Insufficient documentation

## 2015-06-23 NOTE — Progress Notes (Signed)
Patient did not bring meter to visit today.  Says FBSs was 175 today, and have been a "little lower since increasing the dose of levemir.  He has 3 vial remaining of the levemir (2 months) before switching to [place]. Typical day: 5AM: up.   6:15 AM bfast of Honey Nut chereos with milk. 9AM:  Omer Jack Sausage, Egg and cheese Croissant 10:30AM:  Piece of fruit, apple 12PM:  Sandwich, chips and 1 cup of sugar free pudding 3-4 banana (says need this for leg cramps at night.)  6PM: salad, with low fat dressing, chicken and rice soup with "lots of crackers 9:30PM: 6 sugar free cookies, or 2-3 scoops of sugar free incream. 10PM  Bed  Pt. Reports that when he does not eat HS snack, his FBSs are in the 120s.    Insulin dose:  Levemir: 100u BID, Novolog 30 acmeals.  He rarely increases, or adjusts this dose when meals vary in the amount of carbs eaten Exercise:  None due to back pain.  Discussed the idea of balancing meals for better blood sugar control and prevent hunger between meals. Suggestions given: 1. stop cold cereal and milk.   2.  Switch to toast with peanut butter or melted low fat cheese, or have [employer] sausage at 6AM.   3.  Have 1/4 cup of peanuts/Almonds at mid morning snack 4.  Have 1/2 banana at 3-4 PM, with peanut butter 5.  Add protein at supper meal by putting [place] on salad, or hard boiled eggs, or low fat cheese 6.  If having cookies or ice cream at HS, take 5 extra units of Novolog before the snacking.  Stressed that this will cause more weight gain.

## 2015-06-23 NOTE — Patient Instructions (Signed)
1.  Stop cold cereal and milk for breakfast 2.  Switch to toast with peanut butter or melted low fat cheese, or have [employer] sausage at 6AM.   3.  Have 1/4 cup of peanuts/Almonds at mid morning snack 4.  Have 1/2 banana at 3-4 PM, with peanut butter 5.  Add protein at supper meal by putting [place] on salad, or hard boiled eggs, or low fat cheese 6.  If having cookies or ice cream at HS, take 5 extra units of Novolog before the snacking.  Stressed that this will cause more weight gain  Stop cold cereal and milk for breakfast 7. Test blood sugars 2 hours after breakfast, lunch and supper--one meal each day and call me in 2 weeks.  Telephone number given

## 2015-07-01 ENCOUNTER — Other Ambulatory Visit (INDEPENDENT_AMBULATORY_CARE_PROVIDER_SITE_OTHER): Payer: PRIVATE HEALTH INSURANCE

## 2015-07-01 DIAGNOSIS — E1165 Type 2 diabetes mellitus with hyperglycemia: Secondary | ICD-10-CM | POA: Diagnosis not present

## 2015-07-01 DIAGNOSIS — IMO0002 Reserved for concepts with insufficient information to code with codable children: Secondary | ICD-10-CM

## 2015-07-01 DIAGNOSIS — E291 Testicular hypofunction: Secondary | ICD-10-CM | POA: Diagnosis not present

## 2015-07-01 LAB — BASIC METABOLIC PANEL
BUN: 14 mg/dL (ref 6–23)
CALCIUM: 8.8 mg/dL (ref 8.4–10.5)
CO2: 25 mEq/L (ref 19–32)
Chloride: 103 mEq/L (ref 96–112)
Creatinine, Ser: 1.01 mg/dL (ref 0.40–1.50)
GFR: 79.13 mL/min (ref >60.00)
Glucose, Bld: 225 mg/dL — ABNORMAL HIGH (ref 70–99)
Potassium: 3.8 mEq/L (ref 3.5–5.1)
Sodium: 138 mEq/L (ref 135–145)

## 2015-07-01 LAB — TESTOSTERONE: Testosterone: 178.37 ng/dL — ABNORMAL LOW (ref 300.00–890.00)

## 2015-07-02 LAB — FRUCTOSAMINE: Fructosamine: 304 umol/L — ABNORMAL HIGH (ref 0–285)

## 2015-07-03 ENCOUNTER — Encounter: Payer: Self-pay | Admitting: Endocrinology

## 2015-07-03 ENCOUNTER — Ambulatory Visit (INDEPENDENT_AMBULATORY_CARE_PROVIDER_SITE_OTHER): Payer: PRIVATE HEALTH INSURANCE | Admitting: Endocrinology

## 2015-07-03 VITALS — BP 140/70 | HR 84 | Temp 97.8°F | Resp 16 | Ht 70.0 in | Wt 265.6 lb

## 2015-07-03 DIAGNOSIS — IMO0002 Reserved for concepts with insufficient information to code with codable children: Secondary | ICD-10-CM

## 2015-07-03 DIAGNOSIS — E291 Testicular hypofunction: Secondary | ICD-10-CM | POA: Diagnosis not present

## 2015-07-03 DIAGNOSIS — E1165 Type 2 diabetes mellitus with hyperglycemia: Secondary | ICD-10-CM | POA: Diagnosis not present

## 2015-07-03 MED ORDER — EMPAGLIFLOZIN 10 MG PO TABS: 10.0000 mg | ORAL_TABLET | Freq: Every day | ORAL | Status: DC

## 2015-07-03 NOTE — Patient Instructions (Addendum)
Check blood sugars on waking up .Marland Kitchen3-4  .Marland Kitchen times a week Also check blood sugars about 2 hours after a meal and do this after different meals by rotation Check expiration of strips  Recommended blood sugar levels on waking up is 90-130 and about 2 hours after meal is 140-180 Please bring blood sugar monitor to each visit.  Novolog 30-35-30 at meals Tresiba 100 2x a  day  If am sugar stays < 100 reduce Levemir by 10  Jardiance in am, stay on [employer]  Walk more,

## 2015-07-03 NOTE — Progress Notes (Signed)
Patient ID: Drew Salinas, male   DOB: 1951/02/18, 64 y.o.   MRN: [id number]   Chief complaint:  followup consultation for low testosterone in diabetes  History of Present Illness:  DIABETES:  He has previously been managed by his PCP for diabetes but now he is establishing here for diabetes also  He has been told to have diabetes several years ago and has been on insulin for about 10 years Previously in [place] he was taking large doses of insulin and this has been continued with Levemir 100 units twice a day along with Novolog 30-36 units before meals.  He prefers to take Levemir with a syringe because of needing large doses He has also been taking Janumet for a few years Apparently hasn't been tried on GLP-1 drugs or Invokana  His A1c has been typically consistently high He monitors his blood sugars only in the morning usually with the One Touch ultra mini monitor  He says he has large supply of test strips from his family members but does not know if they are expired  RECENT history:   Again he did not bring his monitor for download as directed  He was told to switch Levemir to [place] but he still has a supply of Levemir and has not done so  He has seen the diabetes educator and is trying to make some changes in his meal planning  However has not lost any weight  Not exercising much and no formal exercise  He thinks his blood sugars are the highest after lunch, relatively better after her evening meal when he has more of a soup and salad meal  Glucose in the lab was 225 after breakfast  He does take his Novolog before meals as directed, once took extra at bedtime in the morning his glucose was 80  Blood sugars by recall:  PRE-MEAL Fasting Lunch Dinner Bedtime Overall  Glucose range:  120-150   150      Mean/median:        POST-MEAL PC Breakfast PC Lunch PC Dinner  Glucose range:   159-250  150-200   Mean/median:       Exercise: Trying to Walk at  Work  more No overnight hypoglycemia He has not had a consultation with dietitian in several years Last CDE consultation: 06/2015  Wt Readings from Last 3 Encounters:  07/03/15 265 lb 9.6 oz (120.475 kg)  06/03/15 267 lb 3.2 oz (121.201 kg)  05/22/15 262 lb 3.2 oz (118.933 kg)    Lab Results  Component Value Date   HGBA1C 8.0 05/22/2015   HGBA1C 8.0 01/16/2015   HGBA1C 9.0 09/12/2014   Lab Results  Component Value Date   MICROALBUR 0.8 05/22/2015   LDLCALC 41 05/22/2015   CREATININE 1.01 07/01/2015    HYPOGONADISM: He was seen in consultation in 9/14 for evaluation of a low testosterone level. This was initially tested because of erectile dysfunction At that time his libido had been fairly good and also did not complain of any unusual fatigue, poor muscle endurance,  lack of motivation or depression Evaluation confirmed a low free testosterone level as well as normal prolactin. LH level was mildly increased  Because of documented hypogonadism he was started on Androderm 4 mg patch  However because of skin irritation with the patch and inconsistent adherence he was changed to AndroGel Apparently in 2015 this was changed to [place] by his PCP   Testosterone levels: Baseline 151, after starting AndroGel 420 in 12/14  Subjectively with testosterone supplementation he previously had more energy level and mood is improved. However has not taken any testosterone supplements and over a year   He was told to start [place] but he has not picked up the prescription Again follow-up testosterone is  low as expected  Lab Results  Component Value Date   TESTOSTERONE 178.37* 07/01/2015        Medication List       This list is accurate as of: 07/03/15  8:40 AM.  Always use your most recent med list.               amLODipine 5 MG tablet  Commonly known as:  NORVASC  Take 1 tablet (5 mg total) by mouth daily.     atorvastatin 10 MG tablet  Commonly known as:  LIPITOR  Take 1  tablet (10 mg total) by mouth daily at 6 PM.     buPROPion 300 MG 24 hr tablet  Commonly known as:  WELLBUTRIN XL  Take 1 tablet (300 mg total) by mouth daily.     clopidogrel 75 MG tablet  Commonly known as:  PLAVIX  Take 1 tablet (75 mg total) by mouth daily.     clotrimazole 1 % cream  Commonly known as:  LOTRIMIN  Apply 1 application topically 2 (two) times daily. To feet x 2 wks     insulin aspart 100 UNIT/ML injection  Commonly known as:  NOVOLOG  INJECT 30-40 UNITS INTO THE SKIN 3 TIMES A DAY WITH MEALS by sliding scale     Insulin Degludec 200 UNIT/ML Sopn  Commonly known as:  TRESIBA FLEXTOUCH  Inject 100 Units into the skin 2 (two) times daily.     Insulin Syringe-Needle U-100 31G X 5/16" 1 ML Misc  Commonly known as:  B-D INS SYR ULTRAFINE 1CC/31G  Use as directed 4-6 times daily.     LEVEMIR FLEXTOUCH [place]  Inject 100 Units into the skin.     meloxicam 7.5 MG tablet  Commonly known as:  MOBIC  Take 1 tablet (7.5 mg total) by mouth 2 (two) times daily. For left sciatica     sildenafil 20 MG tablet  Commonly known as:  REVATIO  Take 1 tablet (20 mg total) by mouth as directed.     sitaGLIPtin-metformin 50-1000 MG per tablet  Commonly known as:  JANUMET  TAKE 1 TABLET BY MOUTH TWICE A DAY WITH MEALS.  "OV NEEDED"     STENDRA 200 MG Tabs  Generic drug:  Avanafil  TAKE 1 TABLET BY MOTH AS NEEDED     Testosterone 10 MG/ACT (2%) Gel  Commonly known as:  FORTESTA  Apply 2 pumps on each thigh daily     traZODone 100 MG tablet  Commonly known as:  DESYREL  TAKE 1 TABLET BY MOUTH AT BEDTIME        Allergies:  Allergies  Allergen Reactions  . Lisinopril Cough    Past Medical History  Diagnosis Date  . Hypertension   . Hyperlipidemia   . Allergy   . Stroke 1993  . Diabetes mellitus 1995  . GERD (gastroesophageal reflux disease)     Past Surgical History  Procedure Laterality Date  . Appendectomy      Family History  Problem Relation Age of  Onset  . Diabetes Mother   . Diabetes Brother     Social History:  reports that he has been smoking Cigarettes.  He has a 22.5 pack-year smoking history. He  has never used smokeless tobacco. He reports that he drinks about 1.2 oz of alcohol per week. He reports that he does not use illicit drugs.  REVIEW of systems:  Recently having leg pain intermittently and not improved with taking meloxicam given by PCP.  Not able to walk much  He has history of hyperlipidemia treated with Lipitor  History of hypertension treated with amlodipine   Taking Plavix for history of stroke with no residual deficit  LABS:   Appointment on 07/01/2015  Component Date Value Ref Range Status  . Fructosamine 07/01/2015 304* 0 - 285 umol/L Final   Comment: Published reference interval for apparently healthy subjects between age 25 and 14 is 92 - 285 umol/L and in a poorly controlled diabetic population is 228 - 563 umol/L with a mean of 396 umol/L.   Marland Kitchen Sodium 07/01/2015 138  135 - 145 mEq/L Final  . Potassium 07/01/2015 3.8  3.5 - 5.1 mEq/L Final  . Chloride 07/01/2015 103  96 - 112 mEq/L Final  . CO2 07/01/2015 25  19 - 32 mEq/L Final  . Glucose, Bld 07/01/2015 225* 70 - 99 mg/dL Final  . BUN 91/47/8295 14  6 - 23 mg/dL Final  . Creatinine, Ser 07/01/2015 1.01  0.40 - 1.50 mg/dL Final  . Calcium 62/13/0865 8.8  8.4 - 10.5 mg/dL Final  . GFR 78/46/9629 79.13  >60.00 mL/min Final  . Testosterone 07/01/2015 178.37* 300.00 - 890.00 ng/dL Final  Office Visit on 05/22/2015  Component Date Value Ref Range Status  . Cholesterol 05/22/2015 155  0 - 200 mg/dL Final   Comment: ATP III Classification:       < 200        mg/dL        Desirable      528 - 239     mg/dL        Borderline High      >= 240        mg/dL        High     . Triglycerides 05/22/2015 280* <150 mg/dL Final  . HDL 41/32/4401 58  >=40 mg/dL Final  . Total CHOL/HDL Ratio 05/22/2015 2.7   Final  . VLDL 05/22/2015 56* 0 - 40 mg/dL Final    . LDL Cholesterol 05/22/2015 41  0 - 99 mg/dL Final   Comment:   Total Cholesterol/HDL Ratio:CHD Risk                        Coronary Heart Disease Risk Table                                        Men       Women          1/2 Average Risk              3.4        3.3              Average Risk              5.0        4.4           2X Average Risk              9.6        7.1  3X Average Risk             23.4       11.0 Use the calculated Patient Ratio above and the CHD Risk table  to determine the patient's CHD Risk. ATP III Classification (LDL):       < 100        mg/dL         Optimal      161 - 129     mg/dL         Near or Above Optimal      130 - 159     mg/dL         Borderline High      160 - 189     mg/dL         High       > 096        mg/dL         Very High     . Sodium 05/22/2015 137  135 - 145 mEq/L Final  . Potassium 05/22/2015 4.7  3.5 - 5.3 mEq/L Final  . Chloride 05/22/2015 98  96 - 112 mEq/L Final  . CO2 05/22/2015 25  19 - 32 mEq/L Final  . Glucose, Bld 05/22/2015 175* 70 - 99 mg/dL Final  . BUN 04/54/0981 12  6 - 23 mg/dL Final  . Creat 19/14/7829 0.99  0.50 - 1.35 mg/dL Final  . Total Bilirubin 05/22/2015 0.4  0.2 - 1.2 mg/dL Final  . Alkaline Phosphatase 05/22/2015 75  39 - 117 U/L Final  . AST 05/22/2015 50* 0 - 37 U/L Final  . ALT 05/22/2015 35  0 - 53 U/L Final  . Total Protein 05/22/2015 7.5  6.0 - 8.3 g/dL Final  . Albumin 56/21/3086 4.3  3.5 - 5.2 g/dL Final  . Calcium 57/84/6962 9.7  8.4 - 10.5 mg/dL Final  . Hemoglobin X5M 05/22/2015 8.0   Final  . Microalb, Ur 05/22/2015 0.8  <2.0 mg/dL Final   Comment: The ADA (Diabetes Care 2014;37(suppl 1):S14-S80) has defined abnormalities in albumin excretion as follows:            Category           Result                            (mg/g creatinine)                 Normal:    <30       Microalbuminuria:    30 - 299   Clinical albuminuria:    > or = 300    The ADA recommends that at least two  of three specimens collected within a 3 - 6 month period be abnormal before considering a patient to be within a diagnostic category.   . Creatinine, Urine 05/22/2015 56.6   Final   No reference range established.  . Microalb Creat Ratio 05/22/2015 14.1  0.0 - 30.0 mg/g Final  . Testosterone 05/22/2015 168* 300 - 890 ng/dL Final   Comment:           Tanner Stage       Male              Male               I              < 30 ng/dL        <  10 ng/dL               II             < 150 ng/dL       < 30 ng/dL               III            100-320 ng/dL     < 35 ng/dL               IV             200-970 ng/dL     09-81 ng/dL               V/Adult        300-890 ng/dL     19-14 ng/dL     . Sex Hormone Binding 05/22/2015 33  22 - 77 nmol/L Final  . Testosterone, Free 05/22/2015 31.6* 47.0 - 244.0 pg/mL Final   Comment:   The concentration of free testosterone is derived from a mathematical expression based on constants for the binding of testosterone to sex hormone-binding globulin and albumin.   . Testosterone-% Free 05/22/2015 1.9  1.6 - 2.9 % Final     General Examination:   BP 140/70 mmHg  Pulse 84  Temp(Src) 97.8 F (36.6 C)  Resp 16  Ht 5\' 10"  (1.778 m)  Wt 265 lb 9.6 oz (120.475 kg)  BMI 38.11 kg/m2  SpO2 95%  No ankle edema   Assessment/ Plan:  DIABETES type II with obesity:  He has only fair control with large doses of insulin and is significantly insulin resistant His A1c is 8% on his lab glucose was 225 nonfasting See history of present illness for detailed discussion of his current management, blood sugar patterns and problems identified Although he has started making some changes in his diet as directed he is still taking Levemir, was supposed to be switching to [place] U-200 and this was a follow-up visit scheduled Again appears that his blood sugars are lower than expected at home and he may be using expired test strips  Because of his high insulin  requirement and difficulty losing weight he is also good candidate for medication like Jardiance Discussed action of SGLT 2 drugs on lowering glucose by decreasing kidney absorption of glucose, benefits of weight loss and lower blood pressure, possible side effects including candidiasis and dosage regimen   She will start Jardiance 10 mg daily and increase the dose to 25 on the next visit Consider changing Janumet to metformin  Discussed how to [place] works and how that does appear adjusted based on fasting reading Also needs to adjust mealtime dose based on meal size and postprandial blood sugar trend, general principles discussed, see instructions below  Discussed need to check blood sugars after meals and bring monitor for download Encouraged him to be exercising on his own also    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, likely to 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  Novolog 30-35-30 at meals Tresiba 100 2x a  day  If am sugar stays < 100 reduce Levemir by 10  Jardiance in am, stay on [employer]  Walk more,      Counseling time on subjects discussed above is over 50% of today's 25 minute visit     Torence Palmeri  07/03/2015 8:40 AM

## 2015-07-10 ENCOUNTER — Other Ambulatory Visit: Payer: Self-pay | Admitting: Physician Assistant

## 2015-07-10 ENCOUNTER — Other Ambulatory Visit: Payer: Self-pay | Admitting: *Deleted

## 2015-07-10 MED ORDER — GLUCOSE BLOOD VI STRP: ORAL_STRIP | Status: DC

## 2015-07-10 MED ORDER — EMPAGLIFLOZIN 10 MG PO TABS: 10.0000 mg | ORAL_TABLET | Freq: Every day | ORAL | Status: DC

## 2015-07-10 MED ORDER — INSULIN PEN NEEDLE 32G X 4 MM MISC: Status: DC

## 2015-07-31 ENCOUNTER — Encounter (HOSPITAL_COMMUNITY): Payer: Self-pay | Admitting: Emergency Medicine

## 2015-07-31 ENCOUNTER — Emergency Department (INDEPENDENT_AMBULATORY_CARE_PROVIDER_SITE_OTHER)
Admission: EM | Admit: 2015-07-31 | Discharge: 2015-07-31 | Disposition: A | Discharge status: Home / Self Care | Payer: PRIVATE HEALTH INSURANCE | Source: Home / Self Care | Attending: Emergency Medicine | Admitting: Emergency Medicine

## 2015-07-31 DIAGNOSIS — M7581 Other shoulder lesions, right shoulder: Secondary | ICD-10-CM | POA: Diagnosis not present

## 2015-07-31 DIAGNOSIS — M654 Radial styloid tenosynovitis [de Quervain]: Secondary | ICD-10-CM

## 2015-07-31 MED ORDER — IBUPROFEN 800 MG PO TABS: 800.0000 mg | ORAL_TABLET | Freq: Three times a day (TID) | ORAL | Status: DC

## 2015-07-31 NOTE — ED Notes (Addendum)
Right wrist pain.  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  With rotation of forearm, feels pain and "like a catch".  No trauma.   Complains of pain in left upper arm.  Difficulty raising arm above shoulder level whether in front or to the side of patient.  No known injury.  Both issues started 3 weeks ago

## 2015-07-31 NOTE — ED Provider Notes (Signed)
CSN: [id number]     Arrival date & time 07/31/15  1714 History   First MD Initiated Contact with Patient 07/31/15 1806     Chief Complaint  Patient presents with  . Wrist Pain   (Consider location/radiation/quality/duration/timing/severity/associated sxs/prior Treatment) HPI He is a 64 year old man here for evaluation of right wrist and left shoulder pain. Both of these started approximately 3 weeks ago. They have gradually been worsening. He denies any injury or trauma. No change in activity. The right wrist hurts at the radial aspect. He also describes some swelling on the dorsal wrist. Pain is worse with some movement, wrist extension, and pronation/supination.  He has been wearing a wrist brace without improvement. He has tried heat without improvement.  The left shoulder hurts at the lateral aspect of the upper arm. It is worse with abduction or flexion beyond 90. No numbness or tingling. No radiating pain. He tried a heating pad, which did help some.  Past Medical History  Diagnosis Date  . Hypertension   . Hyperlipidemia   . Allergy   . Stroke 1993  . Diabetes mellitus 1995  . GERD (gastroesophageal reflux disease)    Past Surgical History  Procedure Laterality Date  . Appendectomy     Family History  Problem Relation Age of Onset  . Diabetes Mother   . Diabetes Brother    Social History  Substance Use Topics  . Smoking status: Current Some Day Smoker -- 0.50 packs/day for 45 years    Types: Cigarettes    Last Attempt to Quit: 07/05/2013  . Smokeless tobacco: Never Used     Comment: quit on 07/05/2013  . Alcohol Use: 1.2 oz/week    2 Cans of beer per week    Review of Systems As in history of present illness Allergies  Lisinopril  Home Medications   Prior to Admission medications   Medication Sig Start Date End Date Taking? Authorizing Emin Foree  amLODipine (NORVASC) 5 MG tablet Take 1 tablet (5 mg total) by mouth daily. 05/22/15   Sherren Mocha, MD  atorvastatin  (LIPITOR) 10 MG tablet Take 1 tablet (10 mg total) by mouth daily at 6 PM. 05/22/15   Sherren Mocha, MD  buPROPion (WELLBUTRIN XL) 300 MG 24 hr tablet Take 1 tablet (300 mg total) by mouth daily. 05/22/15   Sherren Mocha, MD  clopidogrel (PLAVIX) 75 MG tablet Take 1 tablet (75 mg total) by mouth daily. 05/22/15   Sherren Mocha, MD  clotrimazole (LOTRIMIN) 1 % cream Apply 1 application topically 2 (two) times daily. To feet x 2 wks 05/22/15   Sherren Mocha, MD  empagliflozin (JARDIANCE) 10 MG TABS tablet Take 10 mg by mouth daily. 07/10/15   Reather Littler, MD  glucose blood (ONE TOUCH ULTRA TEST) test strip Use as instructed to check blood sugar 3 times per day dx code E11.65 07/10/15   Reather Littler, MD  ibuprofen (ADVIL,MOTRIN) 800 MG tablet Take 1 tablet (800 mg total) by mouth 3 (three) times daily. For 7 days, then as needed 07/31/15   Charm Rings, MD  insulin aspart (NOVOLOG) 100 UNIT/ML injection INJECT 30-40 UNITS INTO THE SKIN 3 TIMES A DAY WITH MEALS by sliding scale 05/22/15   Sherren Mocha, MD  Insulin Degludec (TRESIBA FLEXTOUCH) 200 UNIT/ML SOPN Inject 100 Units into the skin 2 (two) times daily. 06/03/15   Reather Littler, MD  Insulin Detemir (LEVEMIR FLEXTOUCH [place]) Inject 100 Units into the skin.  Historical Jaceion Aday, MD  Insulin Pen Needle 32G X 4 MM MISC Use 3 per day to inject insulin 07/10/15   Reather Littler, MD  Insulin Syringe-Needle U-100 (B-D INS SYR ULTRAFINE 1CC/31G) 31G X 5/16" 1 ML MISC Use as directed 4-6 times daily. 01/16/15   Maurice March, MD  meloxicam (MOBIC) 7.5 MG tablet Take 1 tablet (7.5 mg total) by mouth 2 (two) times daily. For left sciatica 05/22/15   Sherren Mocha, MD  sildenafil (REVATIO) 20 MG tablet Take 1 tablet (20 mg total) by mouth as directed. 10/02/13   Reather Littler, MD  sitaGLIPtin-metformin (JANUMET) 50-1000 MG per tablet Take 1 tablet by mouth 2 (two) times daily. 07/13/15   Sherren Mocha, MD  STENDRA 200 MG TABS TAKE 1 TABLET BY MOTH AS NEEDED    Reather Littler, MD  traZODone (DESYREL) 100 MG  tablet TAKE 1 TABLET BY MOUTH AT BEDTIME 05/22/15   Sherren Mocha, MD   Meds Ordered and Administered this Visit  Medications - No data to display  BP 165/93 mmHg  Pulse 96  Temp(Src) 98.8 F (37.1 C) (Oral)  Resp 16  SpO2 97% No data found.   Physical Exam  Constitutional: He is oriented to person, place, and time. He appears well-developed and well-nourished. No distress.  Cardiovascular: Normal rate.   Pulmonary/Chest: Effort normal.  Musculoskeletal:  Right wrist: 2+ radial pulse. He has some swelling at the dorsal radial wrist. He is tender over this area. No bony tenderness. He is also tender at the radial wrist. Positive Finkelstein's. Brisk cap refill in all digits. He has full active range of motion. 5 out of 5 strength in grip and interosseous. 5 strength in wrist flexion and extension. He does have pain with resisted extension. Left shoulder: No swelling. No point tenderness. Pain with active and passive abduction beyond 90. Positive empty can. Pain with resisted internal rotation. Negative Hawkins.  Neurological: He is alert and oriented to person, place, and time.    ED Course  Procedures (including critical care time)  Labs Review Labs Reviewed - No data to display  Imaging Review No results found.     MDM   1. De Quervain's tenosynovitis   2. Right rotator cuff tendonitis    Conservative management with wrist brace, ice, ibuprofen. Recommended follow-up with PCP in 1-2 weeks if not improving.    Charm Rings, MD 07/31/15 [phone]

## 2015-07-31 NOTE — Discharge Instructions (Signed)
You have tendinitis in your right wrist and left shoulder. Apply ice to the wrist as often as you can. You can alternate ice and heat to the shoulder. Take ibuprofen 800 mg 3 times a day for the next 7 days, then as needed. Wear the wrist brace for the next week, then as needed.  Remove the brace several times a day to do range of motion. If things are not improving in the next week, please follow-up with your primary care doctor to discuss additional options or referral to orthopedics.

## 2015-08-10 ENCOUNTER — Other Ambulatory Visit (INDEPENDENT_AMBULATORY_CARE_PROVIDER_SITE_OTHER): Payer: PRIVATE HEALTH INSURANCE

## 2015-08-10 DIAGNOSIS — E1165 Type 2 diabetes mellitus with hyperglycemia: Secondary | ICD-10-CM

## 2015-08-10 DIAGNOSIS — E291 Testicular hypofunction: Secondary | ICD-10-CM

## 2015-08-10 DIAGNOSIS — IMO0002 Reserved for concepts with insufficient information to code with codable children: Secondary | ICD-10-CM

## 2015-08-10 LAB — BASIC METABOLIC PANEL
BUN: 18 mg/dL (ref 6–23)
CALCIUM: 9.2 mg/dL (ref 8.4–10.5)
CO2: 25 mEq/L (ref 19–32)
Chloride: 102 mEq/L (ref 96–112)
Creatinine, Ser: 1.15 mg/dL (ref 0.40–1.50)
GFR: 68.1 mL/min (ref >60.00)
Glucose, Bld: 173 mg/dL — ABNORMAL HIGH (ref 70–99)
POTASSIUM: 4.3 meq/L (ref 3.5–5.1)
SODIUM: 137 meq/L (ref 135–145)

## 2015-08-10 LAB — TESTOSTERONE: TESTOSTERONE: 171.02 ng/dL — AB (ref 300.00–890.00)

## 2015-08-10 LAB — HEMOGLOBIN A1C: Hgb A1c MFr Bld: 7.4 % — ABNORMAL HIGH (ref 4.6–6.5)

## 2015-08-13 ENCOUNTER — Encounter: Payer: Self-pay | Admitting: Endocrinology

## 2015-08-13 ENCOUNTER — Ambulatory Visit (INDEPENDENT_AMBULATORY_CARE_PROVIDER_SITE_OTHER): Payer: PRIVATE HEALTH INSURANCE | Admitting: Endocrinology

## 2015-08-13 VITALS — BP 126/62 | HR 78 | Temp 98.2°F | Resp 16 | Ht 70.0 in | Wt 264.4 lb

## 2015-08-13 DIAGNOSIS — E291 Testicular hypofunction: Secondary | ICD-10-CM

## 2015-08-13 DIAGNOSIS — Z794 Long term (current) use of insulin: Secondary | ICD-10-CM | POA: Diagnosis not present

## 2015-08-13 DIAGNOSIS — S43422D Sprain of left rotator cuff capsule, subsequent encounter: Secondary | ICD-10-CM | POA: Diagnosis not present

## 2015-08-13 DIAGNOSIS — E1165 Type 2 diabetes mellitus with hyperglycemia: Secondary | ICD-10-CM | POA: Diagnosis not present

## 2015-08-13 MED ORDER — TESTOSTERONE 12.5 MG/ACT (1%) TD GEL: TRANSDERMAL | Status: DC

## 2015-08-13 NOTE — Patient Instructions (Addendum)
Novolog 25 at lunch and supper, 20 for cereal then 10 at mid-am snack  Tresiba 85 twice daily  Check blood sugars on waking up .Marland Kitchen.3-4  .Marland Kitchen. times a week Also check blood sugars about 2 hours after a meal and do this after different meals by rotation Recommended blood sugar levels on waking up is 90-130 and about 2 hours after meal is 140-180 Please bring blood sugar monitor to each visit.  Walking daily

## 2015-08-13 NOTE — Progress Notes (Signed)
Patient ID: Drew Salinas, male   DOB: 09-Jan-1951, 64 y.o.   MRN: [id number]   Chief complaint:  followup consultation for low testosterone in diabetes  History of Present Illness:  DIABETES:  He has previously been managed by his PCP for diabetes but now he is establishing here for diabetes also  He has been told to have diabetes several years ago and has been on insulin for about 10 years Previously in [place] he was taking large doses of insulin and this has been continued with Levemir 100 units twice a day along with Novolog 30-36 units before meals.  He prefers to take Levemir with a syringe because of needing large doses He has also been taking Janumet for a few years Apparently hasn't been tried on GLP-1 drugs or Invokana  His A1c has been typically consistently high He monitors his blood sugars only in the morning usually with the One Touch ultra mini monitor  He says he has large supply of test strips from his family members but does not know if they are expired  RECENT history:  INSULIN doses: TRESIBA 90 units twice a day, Novolog 30-35 before meals  He has been switched from Levemir to [facility] in 9/16 Also was started on Jardiance 10 mg daily and on his A1c is notably better at 7.4 already but his blood sugars are significantly better at home   He has had fairly good fasting readings although somewhat higher this week  Since he was having low normal readings at times he reduced his to see about 2 weeks ago from 100 down to 90 units twice a day  POSTPRANDIAL readings are low normal after breakfast and mostly after supper.  He is eating cereal in the morning and then eating breakfast sandwich at 9 AM  Sugars are relatively lower before supper also  However has not lost any weight  Not exercising despite reminders  Has only one symptomatic low sugar of 66 midmorning  Mean values apply above for all meters except median for One Touch  PRE-MEAL Fasting Lunch  Dinner Bedtime Overall  Glucose range:  76-155    70-132     Mean/median:      122    POST-MEAL PC Breakfast PC Lunch PC Dinner  Glucose range:  66, 114   137-201   73-257   Mean/median:       Exercise: Trying to Walk at  Work, no formal exercise He has not had a consultation with dietitian in several years Last CDE consultation: 06/2015  Wt Readings from Last 3 Encounters:  08/13/15 264 lb 6.4 oz (119.931 kg)  07/03/15 265 lb 9.6 oz (120.475 kg)  06/03/15 267 lb 3.2 oz (121.201 kg)    Lab Results  Component Value Date   HGBA1C 7.4* 08/10/2015   HGBA1C 8.0 05/22/2015   HGBA1C 8.0 01/16/2015   Lab Results  Component Value Date   MICROALBUR 0.8 05/22/2015   LDLCALC 41 05/22/2015   CREATININE 1.15 08/10/2015    HYPOGONADISM: He was seen in consultation in 9/14 for evaluation of a low testosterone level. This was initially tested because of erectile dysfunction At that time his libido had been fairly good and also did not complain of any unusual fatigue, poor muscle endurance,  lack of motivation or depression Evaluation confirmed a low free testosterone level as well as normal prolactin. LH level was mildly increased  Because of documented hypogonadism he was started on Androderm 4 mg patch  However because  of skin irritation with the patch and inconsistent adherence he was changed to AndroGel Apparently in 2015 this was changed to [place] by his PCP   Testosterone levels: Baseline 151, after starting AndroGel, 420 in 12/14  Subjectively with testosterone supplementation he previously had more energy level and mood is improved. However has not taken any testosterone supplements and over a year   He was told to start [place] but he says it was expensive and he did not let us know about this Again follow-up testosterone is  low as expected  Lab Results  Component Value Date   TESTOSTERONE 171.02* 08/10/2015        Medication List       This list is accurate  as of: 08/13/15  9:05 AM.  Always use your most recent med list.               amLODipine 5 MG tablet  Commonly known as:  NORVASC  Take 1 tablet (5 mg total) by mouth daily.     atorvastatin 10 MG tablet  Commonly known as:  LIPITOR  Take 1 tablet (10 mg total) by mouth daily at 6 PM.     buPROPion 300 MG 24 hr tablet  Commonly known as:  WELLBUTRIN XL  Take 1 tablet (300 mg total) by mouth daily.     clopidogrel 75 MG tablet  Commonly known as:  PLAVIX  Take 1 tablet (75 mg total) by mouth daily.     clotrimazole 1 % cream  Commonly known as:  LOTRIMIN  Apply 1 application topically 2 (two) times daily. To feet x 2 wks     empagliflozin 10 MG Tabs tablet  Commonly known as:  JARDIANCE  Take 10 mg by mouth daily.     glucose blood test strip  Commonly known as:  ONE TOUCH ULTRA TEST  Use as instructed to check blood sugar 3 times per day dx code E11.65     ibuprofen 800 MG tablet  Commonly known as:  ADVIL,MOTRIN  Take 1 tablet (800 mg total) by mouth 3 (three) times daily. For 7 days, then as needed     insulin aspart 100 UNIT/ML injection  Commonly known as:  NOVOLOG  INJECT 30-40 UNITS INTO THE SKIN 3 TIMES A DAY WITH MEALS by sliding scale     Insulin Degludec 200 UNIT/ML Sopn  Commonly known as:  TRESIBA FLEXTOUCH  Inject 100 Units into the skin 2 (two) times daily.     Insulin Pen Needle 32G X 4 MM Misc  Use 3 per day to inject insulin     Insulin Syringe-Needle U-100 31G X 5/16" 1 ML Misc  Commonly known as:  B-D INS SYR ULTRAFINE 1CC/31G  Use as directed 4-6 times daily.     meloxicam 7.5 MG tablet  Commonly known as:  MOBIC  Take 1 tablet (7.5 mg total) by mouth 2 (two) times daily. For left sciatica     sildenafil 20 MG tablet  Commonly known as:  REVATIO  Take 1 tablet (20 mg total) by mouth as directed.     sitaGLIPtin-metformin 50-1000 MG tablet  Commonly known as:  JANUMET  Take 1 tablet by mouth 2 (two) times daily.     Testosterone  12.5 MG/ACT (1%) Gel  2 pumps on 1 shoulder and 1 on other     traZODone 100 MG tablet  Commonly known as:  DESYREL  TAKE 1 TABLET BY MOUTH AT BEDTIME  Allergies:  Allergies  Allergen Reactions  . Lisinopril Cough    Past Medical History  Diagnosis Date  . Hypertension   . Hyperlipidemia   . Allergy   . Stroke (HCC) 1993  . Diabetes mellitus 1995  . GERD (gastroesophageal reflux disease)     Past Surgical History  Procedure Laterality Date  . Appendectomy      Family History  Problem Relation Age of Onset  . Diabetes Mother   . Diabetes Brother     Social History:  reports that he has been smoking Cigarettes.  He has a 22.5 pack-year smoking history. He has never used smokeless tobacco. He reports that he drinks about 1.2 oz of alcohol per week. He reports that he does not use illicit drugs.  REVIEW of systems:  He is having wrist pain and left shoulder pain seen in the urgent care center and still having symptoms  He has history of hyperlipidemia treated with Lipitor  Lab Results  Component Value Date   CHOL 155 05/22/2015   HDL 58 05/22/2015   LDLCALC 41 05/22/2015   TRIG 280* 05/22/2015   CHOLHDL 2.7 05/22/2015    History of hypertension treated with amlodipine only  Taking Plavix for history of stroke with no residual deficit  LABS:   Appointment on 08/10/2015  Component Date Value Ref Range Status  . Hgb A1c MFr Bld 08/10/2015 7.4* 4.6 - 6.5 % Final   Glycemic Control Guidelines for People with Diabetes:Non Diabetic:  <6%Goal of Therapy: <7%Additional Action Suggested:  >8%   . Sodium 08/10/2015 137  135 - 145 mEq/L Final  . Potassium 08/10/2015 4.3  3.5 - 5.1 mEq/L Final  . Chloride 08/10/2015 102  96 - 112 mEq/L Final  . CO2 08/10/2015 25  19 - 32 mEq/L Final  . Glucose, Bld 08/10/2015 173* 70 - 99 mg/dL Final  . BUN 16/08/9603 18  6 - 23 mg/dL Final  . Creatinine, Ser 08/10/2015 1.15  0.40 - 1.50 mg/dL Final  . Calcium 54/07/8118  9.2  8.4 - 10.5 mg/dL Final  . GFR 14/78/2956 68.10  >60.00 mL/min Final  . Testosterone 08/10/2015 171.02* 300.00 - 890.00 ng/dL Final  Appointment on 21/30/8657  Component Date Value Ref Range Status  . Fructosamine 07/01/2015 304* 0 - 285 umol/L Final   Comment: Published reference interval for apparently healthy subjects between age 69 and 55 is 81 - 285 umol/L and in a poorly controlled diabetic population is 228 - 563 umol/L with a mean of 396 umol/L.   Marland Kitchen Sodium 07/01/2015 138  135 - 145 mEq/L Final  . Potassium 07/01/2015 3.8  3.5 - 5.1 mEq/L Final  . Chloride 07/01/2015 103  96 - 112 mEq/L Final  . CO2 07/01/2015 25  19 - 32 mEq/L Final  . Glucose, Bld 07/01/2015 225* 70 - 99 mg/dL Final  . BUN 84/69/6295 14  6 - 23 mg/dL Final  . Creatinine, Ser 07/01/2015 1.01  0.40 - 1.50 mg/dL Final  . Calcium 28/41/3244 8.8  8.4 - 10.5 mg/dL Final  . GFR 11/02/7251 79.13  >60.00 mL/min Final  . Testosterone 07/01/2015 178.37* 300.00 - 890.00 ng/dL Final     General Examination:   BP 126/62 mmHg  Pulse 78  Temp(Src) 98.2 F (36.8 C)  Resp 16  Ht  (1.778 m)  Wt 264 lb 6.4 oz (119.931 kg)  BMI 37.94 kg/m2  SpO2 94%   Assessment/ Plan:  DIABETES type II with obesity:  He has much  better control of his diabetes with adding Tresiba insulin and also Jardiance He is needing less insulin now overall A1c is 7.4 which is already improved  See history of present illness for detailed discussion of his current management, blood sugar patterns and problems identified He does need to have less insulin to cover his meals as he has low normal readings after some of his meals and also before supper  He does need to be more active as he is still having difficulty losing weight He may be  to reduce his Evaristo Buryresiba further but for now will try 85 minutes twice a day; if he is able to get down to 160 units total daily he can do it in one dose in the evening  Also for now he can reduce his  mealtime doses by 5 units, may need 30 units for larger meals only Since he is eating no protein at breakfast he can take less insulin for his cereal and dry 10 units for his morning son which Consider changing Janumet to metformin  Discussed need to check blood sugars after meals and bring monitor for download Encouraged him to be exercising on his own also   HYPERTENSION: Blood pressure is quite normal without orthostatic symptoms.  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 times a week Also check blood sugars about 2 hours after a meal and do this after different meals by rotation Recommended blood sugar levels on waking up is 90-130 and about 2 hours after meal is 140-180 Please bring blood sugar monitor to each visit.  Walking daily      Counseling time on subjects discussed above is over 50% of today's 25 minute visit    Aretta Stetzel  08/13/2015 9:05 AM

## 2015-08-26 ENCOUNTER — Ambulatory Visit: Payer: PRIVATE HEALTH INSURANCE | Admitting: Family Medicine

## 2015-09-03 ENCOUNTER — Ambulatory Visit (INDEPENDENT_AMBULATORY_CARE_PROVIDER_SITE_OTHER)
Admission: RE | Admit: 2015-09-03 | Discharge: 2015-09-03 | Disposition: A | Discharge status: Home / Self Care | Payer: PRIVATE HEALTH INSURANCE | Source: Ambulatory Visit | Attending: Family Medicine | Admitting: Family Medicine

## 2015-09-03 ENCOUNTER — Encounter: Payer: Self-pay | Admitting: Family Medicine

## 2015-09-03 ENCOUNTER — Other Ambulatory Visit (INDEPENDENT_AMBULATORY_CARE_PROVIDER_SITE_OTHER): Payer: PRIVATE HEALTH INSURANCE

## 2015-09-03 ENCOUNTER — Ambulatory Visit (INDEPENDENT_AMBULATORY_CARE_PROVIDER_SITE_OTHER): Payer: PRIVATE HEALTH INSURANCE | Admitting: Family Medicine

## 2015-09-03 VITALS — BP 130/68 | HR 77 | Ht 70.0 in | Wt 263.0 lb

## 2015-09-03 DIAGNOSIS — S62001G Unspecified fracture of navicular [scaphoid] bone of right wrist, subsequent encounter for fracture with delayed healing: Secondary | ICD-10-CM | POA: Insufficient documentation

## 2015-09-03 DIAGNOSIS — M7502 Adhesive capsulitis of left shoulder: Secondary | ICD-10-CM | POA: Diagnosis not present

## 2015-09-03 DIAGNOSIS — M25531 Pain in right wrist: Secondary | ICD-10-CM

## 2015-09-03 DIAGNOSIS — E11618 Type 2 diabetes mellitus with other diabetic arthropathy: Secondary | ICD-10-CM

## 2015-09-03 DIAGNOSIS — M25512 Pain in left shoulder: Secondary | ICD-10-CM | POA: Diagnosis not present

## 2015-09-03 DIAGNOSIS — S62024G Nondisplaced fracture of middle third of navicular [scaphoid] bone of right wrist, subsequent encounter for fracture with delayed healing: Secondary | ICD-10-CM | POA: Diagnosis not present

## 2015-09-03 DIAGNOSIS — M7500 Adhesive capsulitis of unspecified shoulder: Secondary | ICD-10-CM | POA: Insufficient documentation

## 2015-09-03 MED ORDER — VITAMIN D (ERGOCALCIFEROL) 1.25 MG (50000 UNIT) PO CAPS
50000.0000 [IU] | ORAL_CAPSULE | ORAL | Status: DC
Start: 2015-09-03 — End: 2015-10-29

## 2015-09-03 NOTE — Patient Instructions (Signed)
Good to see you.  Ice 20 minutes 2 times daily. Usually after activity and before bed. Vitamin D once weekly for next 12 weeks pennsaid pinkie amount topically 2 times daily as needed.  Exercises 3 times a week.  Xrays when you can.  See me again in 3 weeks.

## 2015-09-03 NOTE — Assessment & Plan Note (Signed)
Patient given injection today and tolerated the procedure very well. Patient does have mild osteoarthritic changes and some mild intersubstance tearing of the rotator cuff. Patient did respond well to the injection. 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 We discussed icing regimen. Topical anti-inflammatories, and patient work with Event organiserathletic trainer to learn home exercises. Patient will come back again in 3-4 weeks for further evaluation and treatment. Patient may need formal physical therapy.

## 2015-09-03 NOTE — Progress Notes (Signed)
**Note De-Salinas via Obfuscation** Tawana ScaleZach Salinas D.O. [facility] [address] [place], [place] [address] Phone: [phone] Subjective:    I'm seeing this patient by the request  of:  SHAW,EVA, MD Lucianne MussKumar  CC: Left shoulder pain,  right wrist pain  FAO:[id number]:Subjective Noberto RetortLarry Swartzentruber is a 64 y.o. male coming in with complaint of left shoulder pain. Patient was initially seen in urgent care on September 30. Patient was diagnosed with a rotator cuff sprain. Does not remember any true injury. Was doing repetitive activity lifting boxes approximately 40 pounds repetitively. Patient states that it started with some weakness. This had significant pain. Continues to have pain but is now noticing that it is even affecting some of his daily activities. States that it is more of a dull throbbing aching sensation. Denies any neck pain associated with it. States that it can wake him up at night. Drew significant radiation down the arm. Rates the severity of 7 out of 10. Drew numbness in the fingertips.  She is also complaining of right wrist pain. Does not remember any true injury. May have caught himself well he was trying to move some boxes. Patient was given a wrist brace. Has been wearing it most of the time day and night for the last 4 weeks. Drew significant improvement. Describes it as more of a dull throbbing sensation. Denies any numbness. States that certain range of motion though can be severely tender. Still tender even to palpation he states.     Past medical history, social, surgical and family history all reviewed in electronic medical record.   Review of Systems: Drew headache, visual changes, nausea, vomiting, diarrhea, constipation, dizziness, abdominal pain, skin rash, fevers, chills, night sweats, weight loss, swollen lymph nodes, body aches, joint swelling, muscle aches, chest pain, shortness of breath, mood changes.   Objective Blood pressure 130/68, pulse 77, height 5\' 10"  (1.778 m), weight 263 lb (119.296 kg), SpO2 97 %.    General: Drew apparent distress alert and oriented x3 mood and affect normal, dressed appropriately.  HEENT: Pupils equal, extraocular movements intact  Respiratory: Patient's speak in full sentences and does not appear short of breath  Cardiovascular: Drew lower extremity edema, non tender, Drew erythema  Skin: Warm dry intact with Drew signs of infection or rash on extremities or on axial skeleton.  Abdomen: Soft nontender  Neuro: Cranial nerves II through XII are intact, neurovascularly intact in all extremities with 2+ DTRs and 2+ pulses.  Lymph: Drew lymphadenopathy of posterior or anterior cervical chain or axillae bilaterally.  Gait normal with good balance and coordination.  MSK:  Non tender with full range of motion and good stability and symmetric strength and tone of  elbows, wrist, hip, knee and ankles bilaterally.  Shoulder: left Inspection reveals Drew abnormalities, atrophy or asymmetry. Palpation is normal with Drew tenderness over AC joint or bicipital groove.  Rotator cuff strength normal throughout. signs of impingement with positive Neer and Hawkin's tests, but negative empty can sign. Speeds and Yergason's tests normal. Drew labral pathology noted with negative Obrien's, negative clunk and good stability. Normal scapular function observed. Drew painful arc and Drew drop arm sign. Drew apprehension sign  MSK US performed of: Right wrist This study was ordered, performed, and interpreted by Terrilee FilesZach Salinas D.O.  Wrist: All extensor compartments visualized and tendons all normal in appearance without fraying, tears, or sheath effusions. Drew effusion seen. TFCC intact. Scapholunate ligament degenerative changes Scaphoid bone does appear to have displacement on the middle third to proximal  third portion with significant hypoechoic changes. Drew significant callus formation noted. Carpal tunnel visualized and median nerve area normal, flexor tendons all normal in appearance without fraying, tears,  or sheath effusions. Power doppler signal normal.  IMPRESSION:  Questionable middle third to proximal third fracture of the scaphoid delayed healing   MSK US performed of: left This study was ordered, performed, and interpreted by Terrilee Files D.O.  Shoulder:   Supraspinatus:  intersubstance tear and thickening of capsule.  Infraspinatus:  Appears normal on long and transverse views. Significant increase in Doppler flow Subscapularis:  Appears normal on long and transverse views. Positive bursa Teres Minor:  Appears normal on long and transverse views. AC joint:  Moderate OA.,  Glenohumeral Joint:  Appears normal without effusion. Glenoid Labrum:  Intact without visualized tears. Biceps Tendon:  Degenerative changes.   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   Procedure: Real-time Ultrasound Guided Injection of left glenohumeral joint Device: GE Logiq E  Ultrasound guided injection is preferred based studies that show increased duration, increased effect, greater accuracy, decreased procedural pain, increased response rate with ultrasound guided versus blind injection.  Verbal informed consent obtained.  Time-out conducted.  Noted Drew overlying erythema, induration, or other signs of local infection.  Skin prepped in a sterile fashion.  Local anesthesia: Topical Ethyl chloride.  With sterile technique and under real time ultrasound guidance:  Joint visualized.  23g 1  inch needle inserted posterior approach. Pictures taken for needle placement. Patient did have injection of 2 cc of 1% lidocaine, 2 cc of 0.5% Marcaine, and 1.0 cc of Kenalog 40 mg/dL. Completed without difficulty  Pain immediately resolved suggesting accurate placement of the medication.  Advised to call if fevers/chills, erythema, induration, drainage, or persistent bleeding.  Images permanently stored and available for review in the ultrasound unit.  Impression: Technically successful ultrasound  guided injection.  Procedure note 97110; 15 minutes spent for Therapeutic exercises as stated in above notes.  This included exercises focusing on stretching, strengthening, with significant focus on eccentric aspects.Basic scapular stabilization to include adduction and depression of scapula Scaption, focusing on proper movement and good control Internal and External rotation utilizing a theraband, with elbow tucked at side entire time Rows with theraband   Proper technique shown and discussed handout in great detail with ATC.  All questions were discussed and answered.     Impression and Recommendations:     This case required medical decision making of moderate complexity.

## 2015-09-03 NOTE — Assessment & Plan Note (Signed)
Patient does have a fracture of the scaphoid bone that likely contributing to most of his discomfort. I do think that this may be minimally displaced and seems to be in the mid third to proximal third portion of the bone. X-rays to further delineate. Patient has been in a mobilizing brace for the last 4 weeks. Encourage him to do it for another 3 weeks. Patient is having slow healing secondary to his other comorbidities so I do think high-dose vitamin D supplementation could be beneficial. Patient will come back and see me again in 3 weeks to make sure that he is healing appropriately. If x-ray shows proximal third fracture with any signs of avascular necrosis he will be referred to a hand specialist.

## 2015-09-03 NOTE — Progress Notes (Signed)
Pre visit review using our clinic review tool, if applicable. No additional management support is needed unless otherwise documented below in the visit note. 

## 2015-09-07 ENCOUNTER — Ambulatory Visit (INDEPENDENT_AMBULATORY_CARE_PROVIDER_SITE_OTHER): Payer: PRIVATE HEALTH INSURANCE | Admitting: Ophthalmology

## 2015-09-07 DIAGNOSIS — H43813 Vitreous degeneration, bilateral: Secondary | ICD-10-CM | POA: Diagnosis not present

## 2015-09-07 DIAGNOSIS — E11319 Type 2 diabetes mellitus with unspecified diabetic retinopathy without macular edema: Secondary | ICD-10-CM | POA: Diagnosis not present

## 2015-09-07 DIAGNOSIS — I10 Essential (primary) hypertension: Secondary | ICD-10-CM | POA: Diagnosis not present

## 2015-09-07 DIAGNOSIS — E113293 Type 2 diabetes mellitus with mild nonproliferative diabetic retinopathy without macular edema, bilateral: Secondary | ICD-10-CM | POA: Diagnosis not present

## 2015-09-07 DIAGNOSIS — H35033 Hypertensive retinopathy, bilateral: Secondary | ICD-10-CM

## 2015-09-07 DIAGNOSIS — H2513 Age-related nuclear cataract, bilateral: Secondary | ICD-10-CM | POA: Diagnosis not present

## 2015-09-28 ENCOUNTER — Ambulatory Visit (INDEPENDENT_AMBULATORY_CARE_PROVIDER_SITE_OTHER): Payer: PRIVATE HEALTH INSURANCE | Admitting: Family Medicine

## 2015-09-28 ENCOUNTER — Encounter: Payer: Self-pay | Admitting: Family Medicine

## 2015-09-28 VITALS — BP 134/72 | HR 83 | Ht 70.0 in | Wt 257.0 lb

## 2015-09-28 DIAGNOSIS — S62024G Nondisplaced fracture of middle third of navicular [scaphoid] bone of right wrist, subsequent encounter for fracture with delayed healing: Secondary | ICD-10-CM | POA: Diagnosis not present

## 2015-09-28 DIAGNOSIS — M25512 Pain in left shoulder: Secondary | ICD-10-CM | POA: Diagnosis not present

## 2015-09-28 DIAGNOSIS — E11618 Type 2 diabetes mellitus with other diabetic arthropathy: Secondary | ICD-10-CM | POA: Diagnosis not present

## 2015-09-28 DIAGNOSIS — M7500 Adhesive capsulitis of unspecified shoulder: Secondary | ICD-10-CM

## 2015-09-28 DIAGNOSIS — M25531 Pain in right wrist: Secondary | ICD-10-CM

## 2015-09-28 NOTE — Assessment & Plan Note (Signed)
Doing better overall. I do think that formal physical therapy will help. Patient has very minimal limitation in range of motion but continues to have positive impingement. Patient was referred to formal physical therapy and will come back in 4 weeks for further evaluation and treatment.

## 2015-09-28 NOTE — Progress Notes (Signed)
Pre visit review using our clinic review tool, if applicable. No additional management support is needed unless otherwise documented below in the visit note. 

## 2015-09-28 NOTE — Progress Notes (Signed)
  Tawana ScaleZach Salinas D.O. [facility] [address] [place], [place] [address] Phone: [phone] Subjective:    I'm seeing this patient by the request  of:  SHAW,EVA, MD Lucianne MussKumar  CC: Left shoulder pain,  right wrist pain follow up  QMV:[id number]:Subjective Drew RetortLarry Salinas is a 64 y.o. male coming in with complaint of left shoulder pain. Patient was initially seen in urgent care on September 30. Patient was diagnosed with a rotator cuff sprain. Patient saw me and had some early adhesive capsulitis. Patient was given an injection. States that his range of motion has improved but continues to have pain. X-rays were ordered by me and does show calcific tendinitis. Patient states that he continues to have the same pain with certain range of motion. States that it can be uncomfortable at night. Denies any associated neck pain.  He also is complaining of right wrist pain. Was found to have possible fracture of the middle third of the scaphoid. Patient was put in a brace and was to limit activity and lifting. Patient's x-ray showed the possibility of an old scaphoid fracture. Patient states though that he has not made any improvement with him wearing the brace on a regular basis. This is been 3 weeks immobilization.     Past medical history, social, surgical and family history all reviewed in electronic medical record.   Review of Systems: No headache, visual changes, nausea, vomiting, diarrhea, constipation, dizziness, abdominal pain, skin rash, fevers, chills, night sweats, weight loss, swollen lymph nodes, body aches, joint swelling, muscle aches, chest pain, shortness of breath, mood changes.   Objective Blood pressure 134/72, pulse 83, height 5\' 10"  (1.778 m), weight 257 lb (116.574 kg), SpO2 94 %.  General: No apparent distress alert and oriented x3 mood and affect normal, dressed appropriately.  HEENT: Pupils equal, extraocular movements intact  Respiratory: Patient's speak in full sentences and does  not appear short of breath  Cardiovascular: No lower extremity edema, non tender, no erythema  Skin: Warm dry intact with no signs of infection or rash on extremities or on axial skeleton.  Abdomen: Soft nontender  Neuro: Cranial nerves II through XII are intact, neurovascularly intact in all extremities with 2+ DTRs and 2+ pulses.  Lymph: No lymphadenopathy of posterior or anterior cervical chain or axillae bilaterally.  Gait normal with good balance and coordination.  MSK:  Non tender with full range of motion and good stability and symmetric strength and tone of  elbows,  hip, knee and ankles bilaterally.  Shoulder: left Inspection reveals no abnormalities, atrophy or asymmetry. Palpation is normal with no tenderness over AC joint or bicipital groove. Rotator cuff strength normal throughout. signs of impingement with positive Neer and Hawkin's tests, but negative empty can sign. Range of motion is near full. Speeds and Yergason's tests normal. No labral pathology noted with negative Obrien's, negative clunk and good stability. Normal scapular function observed. No painful arc and no drop arm sign. No apprehension sign  Right wrist exam shows the patient continues to have a positive tenderness over the snuff box.       Impression and Recommendations:     This case required medical decision making of moderate complexity.

## 2015-09-28 NOTE — Patient Instructions (Signed)
Good to see you Ice is your friend Stay active Wear brace  Decide on MRI for the wrist PT will be calling you Otherwise see me again in 3-4 weeks.

## 2015-09-28 NOTE — Assessment & Plan Note (Signed)
No significant change from previous exam. I do feel that advance imaging is warranted. This is been going on for quite some time and would like to make sure there is no avascular necrosis. Patient come back 1-2 days after the MRI and we'll discuss further treatment and management.

## 2015-10-01 ENCOUNTER — Other Ambulatory Visit: Payer: Self-pay | Admitting: Family Medicine

## 2015-10-01 ENCOUNTER — Encounter: Payer: Self-pay | Admitting: Family Medicine

## 2015-10-01 ENCOUNTER — Ambulatory Visit (INDEPENDENT_AMBULATORY_CARE_PROVIDER_SITE_OTHER): Payer: PRIVATE HEALTH INSURANCE | Admitting: Family Medicine

## 2015-10-01 VITALS — BP 138/70 | HR 88 | Temp 99.1°F | Resp 16 | Ht 71.0 in | Wt 260.6 lb

## 2015-10-01 DIAGNOSIS — IMO0001 Reserved for inherently not codable concepts without codable children: Secondary | ICD-10-CM

## 2015-10-01 DIAGNOSIS — Z1329 Encounter for screening for other suspected endocrine disorder: Secondary | ICD-10-CM

## 2015-10-01 DIAGNOSIS — Z125 Encounter for screening for malignant neoplasm of prostate: Secondary | ICD-10-CM | POA: Diagnosis not present

## 2015-10-01 DIAGNOSIS — Z1389 Encounter for screening for other disorder: Secondary | ICD-10-CM

## 2015-10-01 DIAGNOSIS — E1142 Type 2 diabetes mellitus with diabetic polyneuropathy: Secondary | ICD-10-CM | POA: Diagnosis not present

## 2015-10-01 DIAGNOSIS — Z1211 Encounter for screening for malignant neoplasm of colon: Secondary | ICD-10-CM

## 2015-10-01 DIAGNOSIS — F329 Major depressive disorder, single episode, unspecified: Secondary | ICD-10-CM

## 2015-10-01 DIAGNOSIS — Z113 Encounter for screening for infections with a predominantly sexual mode of transmission: Secondary | ICD-10-CM | POA: Diagnosis not present

## 2015-10-01 DIAGNOSIS — I10 Essential (primary) hypertension: Secondary | ICD-10-CM

## 2015-10-01 DIAGNOSIS — Z1212 Encounter for screening for malignant neoplasm of rectum: Secondary | ICD-10-CM

## 2015-10-01 DIAGNOSIS — E785 Hyperlipidemia, unspecified: Secondary | ICD-10-CM | POA: Diagnosis not present

## 2015-10-01 DIAGNOSIS — Z130 Encounter for screening for diseases of the blood and blood-forming organs and certain disorders involving the immune mechanism: Secondary | ICD-10-CM | POA: Diagnosis not present

## 2015-10-01 DIAGNOSIS — J069 Acute upper respiratory infection, unspecified: Secondary | ICD-10-CM

## 2015-10-01 DIAGNOSIS — Z0000 Encounter for general adult medical examination without abnormal findings: Secondary | ICD-10-CM | POA: Diagnosis not present

## 2015-10-01 DIAGNOSIS — Z720 Tobacco use: Secondary | ICD-10-CM

## 2015-10-01 DIAGNOSIS — Z136 Encounter for screening for cardiovascular disorders: Secondary | ICD-10-CM

## 2015-10-01 DIAGNOSIS — F32A Depression, unspecified: Secondary | ICD-10-CM

## 2015-10-01 DIAGNOSIS — Z1383 Encounter for screening for respiratory disorder NEC: Secondary | ICD-10-CM

## 2015-10-01 LAB — LIPID PANEL
CHOL/HDL RATIO: 2.3 ratio (ref <=5.0)
Cholesterol: 107 mg/dL — ABNORMAL LOW (ref 125–200)
HDL: 46 mg/dL (ref >=40)
LDL CALC: 33 mg/dL (ref <130)
Triglycerides: 141 mg/dL (ref <150)
VLDL: 28 mg/dL (ref <30)

## 2015-10-01 LAB — POCT URINALYSIS DIP (MANUAL ENTRY)
BILIRUBIN UA: NEGATIVE
BILIRUBIN UA: NEGATIVE
Glucose, UA: 500 — AB
LEUKOCYTES UA: NEGATIVE
Nitrite, UA: NEGATIVE
Protein Ur, POC: NEGATIVE
Spec Grav, UA: 1.02
Urobilinogen, UA: 0.2
pH, UA: 5.5

## 2015-10-01 LAB — COMPREHENSIVE METABOLIC PANEL
ALK PHOS: 75 U/L (ref 40–115)
ALT: 26 U/L (ref 9–46)
AST: 32 U/L (ref 10–35)
Albumin: 4.1 g/dL (ref 3.6–5.1)
BILIRUBIN TOTAL: 0.5 mg/dL (ref 0.2–1.2)
BUN: 9 mg/dL (ref 7–25)
CO2: 25 mmol/L (ref 20–31)
CREATININE: 1 mg/dL (ref 0.70–1.25)
Calcium: 8.9 mg/dL (ref 8.6–10.3)
Chloride: 104 mmol/L (ref 98–110)
Glucose, Bld: 80 mg/dL (ref 65–99)
Potassium: 4.1 mmol/L (ref 3.5–5.3)
SODIUM: 140 mmol/L (ref 135–146)
TOTAL PROTEIN: 6.7 g/dL (ref 6.1–8.1)

## 2015-10-01 LAB — TSH: TSH: 1.868 u[IU]/mL (ref 0.350–4.500)

## 2015-10-01 LAB — CBC
HCT: 41.5 % (ref 39.0–52.0)
Hemoglobin: 14.2 g/dL (ref 13.0–17.0)
MCH: 31.2 pg (ref 26.0–34.0)
MCHC: 34.2 g/dL (ref 30.0–36.0)
MCV: 91.2 fL (ref 78.0–100.0)
MPV: 11.4 fL (ref 8.6–12.4)
PLATELETS: 163 10*3/uL (ref 150–400)
RBC: 4.55 MIL/uL (ref 4.22–5.81)
RDW: 13.5 % (ref 11.5–15.5)
WBC: 8.5 10*3/uL (ref 4.0–10.5)

## 2015-10-01 LAB — HEPATITIS C ANTIBODY: HCV AB: NEGATIVE

## 2015-10-01 LAB — HIV ANTIBODY (ROUTINE TESTING W REFLEX): HIV: NONREACTIVE

## 2015-10-01 MED ORDER — ZOSTER VACCINE LIVE 19400 UNT/0.65ML ~~LOC~~ SOLR: 0.6500 mL | Freq: Once | SUBCUTANEOUS | Status: DC

## 2015-10-01 MED ORDER — TESTOSTERONE 12.5 MG/ACT (1%) TD GEL: TRANSDERMAL | Status: DC

## 2015-10-01 NOTE — Progress Notes (Signed)
Subjective:    Patient ID: Noberto RetortLarry Nachtigal, male    DOB: 11/20/1950, 64 y.o.   MRN: [id number] Chief Complaint  Patient presents with  . Annual Exam    HPI   Mr. Samuella Cotarice is here today for his annual physical exam. Last done 1 yr prev by his prior PCP Dr. Audria NineMcPherson.     He has type 2 diabetes and hypogonadism followed by Dr. Lucianne MussKumar - last seen there 6 wks ago, He has also been seeing Dr. Terrilee FilesZach Smith at [facility] for left shoulder and Rt wrist pain - last seen 2d ago - and was referred to PT for frozen shoulder and with an MRI of his right wrist.   HCM: CRS- Current (Nov 2014 w/ 10-yr recall).  IMM- TDaP done 12/2014, pneumovax 2012  Vision- Current (annually).  Checks CBGs 2-3x/d.  Lowest cbg 99 - can be fluctuant and with   No flu shot as URI Get EKG -  Sleeping well  Past Medical History  Diagnosis Date  . Hypertension   . Hyperlipidemia   . Allergy   . Stroke (HCC) 1993  . Diabetes mellitus 1995  . GERD (gastroesophageal reflux disease)    Past Surgical History  Procedure Laterality Date  . Appendectomy     Current Outpatient Prescriptions on File Prior to Visit  Medication Sig Dispense Refill  . amLODipine (NORVASC) 5 MG tablet Take 1 tablet (5 mg total) by mouth daily. 90 tablet 3  . atorvastatin (LIPITOR) 10 MG tablet Take 1 tablet (10 mg total) by mouth daily at 6 PM. 90 tablet 3  . buPROPion (WELLBUTRIN XL) 300 MG 24 hr tablet Take 1 tablet (300 mg total) by mouth daily. 90 tablet 3  . clopidogrel (PLAVIX) 75 MG tablet Take 1 tablet (75 mg total) by mouth daily. 90 tablet 3  . clotrimazole (LOTRIMIN) 1 % cream Apply 1 application topically 2 (two) times daily. To feet x 2 wks 113 g 2  . empagliflozin (JARDIANCE) 10 MG TABS tablet Take 10 mg by mouth daily. 30 tablet 3  . glucose blood (ONE TOUCH ULTRA TEST) test strip Use as instructed to check blood sugar 3 times per day dx code E11.65 100 each 3  . insulin aspart (NOVOLOG) 100 UNIT/ML  injection INJECT 30-40 UNITS INTO THE SKIN 3 TIMES A DAY WITH MEALS by sliding scale 30 mL 3  . Insulin Degludec (TRESIBA FLEXTOUCH) 200 UNIT/ML SOPN Inject 100 Units into the skin 2 (two) times daily. (Patient taking differently: Inject 90 Units into the skin 2 (two) times daily. ) 20 pen 2  . Insulin Pen Needle 32G X 4 MM MISC Use 3 per day to inject insulin 100 each 3  . Insulin Syringe-Needle U-100 (B-D INS SYR ULTRAFINE 1CC/31G) 31G X 5/16" 1 ML MISC Use as directed 4-6 times daily. 600 each 5  . traZODone (DESYREL) 100 MG tablet TAKE 1 TABLET BY MOUTH AT BEDTIME 90 tablet 3   No current facility-administered medications on file prior to visit.   Allergies  Allergen Reactions  . Lisinopril Cough   Family History  Problem Relation Age of Onset  . Diabetes Mother   . Diabetes Brother    Social History   Social History  . Marital Status: Married    Spouse Name: N/A  . Number of Children: 3  . 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 Smoking status:  Current Some Day Smoker -- 0.50 packs/day for 45 years    Types: Cigarettes    Last Attempt to Quit: 07/05/2013  . Smokeless tobacco: Never Used     Comment: quit on 07/05/2013  . Alcohol Use: 1.2 oz/week    2 Cans of beer per week  . Drug Use: No  . Sexual Activity: Yes   Other Topics Concern  . None   Social History Narrative   Depression screen [facility] 2/9 10/01/2015 05/22/2015 01/16/2015 09/12/2014 02/14/2014  Decreased Interest 0 0 0 0 0  Down, Depressed, Hopeless 0 0 0 0 0  PHQ - 2 Score 0 0 0 0 0     Review of Systems  Constitutional: Positive for fatigue.  HENT: Negative.   Eyes: Negative.   Respiratory: Negative.   Cardiovascular: Negative.   Gastrointestinal: Negative.   Endocrine: Negative.   Genitourinary: Negative.   Musculoskeletal: Positive for myalgias and back pain.  Skin: Negative.   Neurological: Negative.   Hematological: Negative.     Psychiatric/Behavioral: Negative.        Objective:  BP 138/70 mmHg  Pulse 88  Temp(Src) 99.1 F (37.3 C) (Oral)  Resp 16  Ht  (1.803 m)  Wt 260 lb 9.6 oz (118.207 kg)  BMI 36.36 kg/m2  Physical Exam  Constitutional: He is oriented to person, place, and time. He appears well-developed and well-nourished. No distress.  HENT:  Head: Normocephalic and atraumatic.  Right Ear: Tympanic membrane, external ear and ear canal normal.  Left Ear: Tympanic membrane, external ear and ear canal normal.  Nose: Nose normal.  Mouth/Throat: Uvula is midline, oropharynx is clear and moist and mucous membranes are normal. No oropharyngeal exudate.  Eyes: Conjunctivae are normal. Right eye exhibits no discharge. Left eye exhibits no discharge. No scleral icterus.  Neck: Normal range of motion. Neck supple. No thyromegaly present.  Cardiovascular: Normal rate, regular rhythm, normal heart sounds and intact distal pulses.   Pulmonary/Chest: Effort normal and breath sounds normal. No respiratory distress.  Abdominal: Soft. Bowel sounds are normal. He exhibits no distension and no mass. There is no tenderness. There is no rebound and no guarding.  Musculoskeletal: He exhibits no edema.  Lymphadenopathy:    He has no cervical adenopathy.  Neurological: He is alert and oriented to person, place, and time. He has normal reflexes. No cranial nerve deficit. He exhibits normal muscle tone.  Skin: Skin is warm and dry. No rash noted. He is not diaphoretic. No erythema.  Psychiatric: He has a normal mood and affect. His behavior is normal.      [facility] reading (PRIMARY) by  Dr. Clelia Croft. EKG: NSR, no acute ischemic changes    Assessment & Plan:  Needs flu. Shinges?  1. Annual physical exam   2. Routine screening for STI (sexually transmitted infection)   3. Screening for thyroid disorder   4. Screening for prostate cancer   5. Screening for deficiency anemia   6. Screening for colorectal cancer   7.  Screening for cardiovascular, respiratory, and genitourinary diseases   8. Essential hypertension, benign   9. Diabetic peripheral neuropathy associated with type 2 diabetes mellitus (HCC)   10. Hyperlipidemia   11. Obesity, Class II, BMI 35-39.9, with comorbidity (HCC)   12. Depression   13. Tobacco user   14. Acute upper respiratory infection     Orders Placed This Encounter  Procedures  . PSA  . TSH  . HIV antibody  . Comprehensive metabolic panel  .  CBC  . Lipid panel    Order Specific Question:  Has the patient fasted?    Answer:  Yes  . POCT urinalysis dipstick  . EKG 12-Lead    Meds ordered this encounter  Medications  . zoster vaccine live, PF, (ZOSTAVAX) 40981 UNT/0.65ML injection    Sig: Inject 19,400 Units into the skin once.    Dispense:  1 vial    Refill:  0  . DISCONTD: Testosterone 12.5 MG/ACT (1%) GEL    Sig: 2 pumps on 1 shoulder and 1 on other    Dispense:  75 g    Refill:  2    30 day supply    Norberto Sorenson, MD MPH

## 2015-10-01 NOTE — Patient Instructions (Addendum)
is now offering annual lung cancer screening by low-dose CT scan.  This is covered for qualifying patients and your insurance will be checked before the procedure.  Call the lung cancer screening nurse navigators at [phone] to learn more about this and get scheduled.  You should stay away from all cough and cold medications containing decongestant, especially phenylephrine and pseudoephedrine (will be listed under the active ingredient list).  To make it easier, CoricidinHBP is a product tailored towards people with hypertension.  Continue the mucinex.  Get your flu shot in 10-14days when you are feeling better.    Health Maintenance, Male A healthy lifestyle and preventative care can promote health and wellness.  Maintain regular health, dental, and eye exams.  Eat a healthy diet. Foods like vegetables, fruits, whole grains, low-fat dairy products, and lean protein foods contain the nutrients you need and are low in calories. Decrease your intake of foods high in solid fats, added sugars, and salt. Get information about a proper diet from your health care provider, if necessary.  Regular physical exercise is one of the most important things you can do for your health. Most adults should get at least 150 minutes of moderate-intensity exercise (any activity that increases your heart rate and causes you to sweat) each week. In addition, most adults need muscle-strengthening exercises on 2 or more days a week.   Maintain a healthy weight. The body mass index (BMI) is a screening tool to identify possible weight problems. It provides an estimate of body fat based on height and weight. Your health care provider can find your BMI and can help you achieve or maintain a healthy weight. For males 20 years and older:  A BMI below 18.5 is considered underweight.  A BMI of 18.5 to 24.9 is normal.  A BMI of 25 to 29.9 is considered overweight.  A BMI of 30 and above is considered  obese.  Maintain normal blood lipids and cholesterol by exercising and minimizing your intake of saturated fat. Eat a balanced diet with plenty of fruits and vegetables. Blood tests for lipids and cholesterol should begin at age 48 and be repeated every 5 years. If your lipid or cholesterol levels are high, you are over age 5, or you are at high risk for heart disease, you may need your cholesterol levels checked more frequently.Ongoing high lipid and cholesterol levels should be treated with medicines if diet and exercise are not working.  If you smoke, find out from your health care provider how to quit. If you do not use tobacco, do not start.  Lung cancer screening is recommended for adults aged 55-80 years who are at high risk for developing lung cancer because of a history of smoking. A yearly low-dose CT scan of the lungs is recommended for people who have at least a 30-pack-year history of smoking and are current smokers or have quit within the past 15 years. A pack year of smoking is smoking an average of 1 pack of cigarettes a day for 1 year (for example, a 30-pack-year history of smoking could mean smoking 1 pack a day for 30 years or 2 packs a day for 15 years). Yearly screening should continue until the smoker has stopped smoking for at least 15 years. Yearly screening should be stopped for people who develop a health problem that would prevent them from having lung cancer treatment.  If you choose to drink alcohol, do not have more than 2 drinks per day.  One drink is considered to be 12 oz (360 mL) of beer, 5 oz (150 mL) of wine, or 1.5 oz (45 mL) of liquor.  Avoid the use of street drugs. Do not share needles with anyone. Ask for help if you need support or instructions about stopping the use of drugs.  High blood pressure causes heart disease and increases the risk of stroke. High blood pressure is more likely to develop in:  People who have blood pressure in the end of the normal  range (100-139/85-89 mm Hg).  People who are overweight or obese.  People who are African American.  If you are 51-7 years of age, have your blood pressure checked every 3-5 years. If you are 79 years of age or older, have your blood pressure checked every year. You should have your blood pressure measured twice--once when you are at a hospital or clinic, and once when you are not at a hospital or clinic. Record the average of the two measurements. To check your blood pressure when you are not at a hospital or clinic, you can use:  An automated blood pressure machine at a pharmacy.  A home blood pressure monitor.  If you are 106-20 years old, ask your health care provider if you should take aspirin to prevent heart disease.  Diabetes screening involves taking a blood sample to check your fasting blood sugar level. This should be done once every 3 years after age 52 if you are at a normal weight and without risk factors for diabetes. Testing should be considered at a younger age or be carried out more frequently if you are overweight and have at least 1 risk factor for diabetes.  Colorectal cancer can be detected and often prevented. Most routine colorectal cancer screening begins at the age of 55 and continues through age 70. However, your health care provider may recommend screening at an earlier age if you have risk factors for colon cancer. On a yearly basis, your health care provider may provide home test kits to check for hidden blood in the stool. A small camera at the end of a tube may be used to directly examine the colon (sigmoidoscopy or colonoscopy) to detect the earliest forms of colorectal cancer. Talk to your health care provider about this at age 15 when routine screening begins. A direct exam of the colon should be repeated every 5-10 years through age 22, unless early forms of precancerous polyps or small growths are found.  People who are at an increased risk for hepatitis B  should be screened for this virus. You are considered at high risk for hepatitis B if:  You were born in a country where hepatitis B occurs often. Talk with your health care provider about which countries are considered high risk.  Your parents were born in a high-risk country and you have not received a shot to protect against hepatitis B (hepatitis B vaccine).  You have HIV or AIDS.  You use needles to inject street drugs.  You live with, or have sex with, someone who has hepatitis B.  You are a man who has sex with other men (MSM).  You get hemodialysis treatment.  You take certain medicines for conditions like cancer, organ transplantation, and autoimmune conditions.  Hepatitis C blood testing is recommended for all people born from [birth year] through 1964 and any individual with known risk factors for hepatitis C.  Healthy men should no longer receive prostate-specific antigen (PSA) blood tests as part of  routine cancer screening. Talk to your health care provider about prostate cancer screening.  Testicular cancer screening is not recommended for adolescents or adult males who have no symptoms. Screening includes self-exam, a health care provider exam, and other screening tests. Consult with your health care provider about any symptoms you have or any concerns you have about testicular cancer.  Practice safe sex. Use condoms and avoid high-risk sexual practices to reduce the spread of sexually transmitted infections (STIs).  You should be screened for STIs, including gonorrhea and chlamydia if:  You are sexually active and are younger than 24 years.  You are older than 24 years, and your health care provider tells you that you are at risk for this type of infection.  Your sexual activity has changed since you were last screened, and you are at an increased risk for chlamydia or gonorrhea. Ask your health care provider if you are at risk.  If you are at risk of being infected with  HIV, it is recommended that you take a prescription medicine daily to prevent HIV infection. This is called pre-exposure prophylaxis (PrEP). You are considered at risk if:  You are a man who has sex with other men (MSM).  You are a heterosexual man who is sexually active with multiple partners.  You take drugs by injection.  You are sexually active with a partner who has HIV.  Talk with your health care provider about whether you are at high risk of being infected with HIV. If you choose to begin PrEP, you should first be tested for HIV. You should then be tested every 3 months for as long as you are taking PrEP.  Use sunscreen. Apply sunscreen liberally and repeatedly throughout the day. You should seek shade when your shadow is shorter than you. Protect yourself by wearing long sleeves, pants, a wide-brimmed hat, and sunglasses year round whenever you are outdoors.  Tell your health care provider of new moles or changes in moles, especially if there is a change in shape or color. Also, tell your health care provider if a mole is larger than the size of a pencil eraser.  A one-time screening for abdominal aortic aneurysm (AAA) and surgical repair of large AAAs by ultrasound is recommended for men aged 65-75 years who are current or former smokers.  Stay current with your vaccines (immunizations).   This information is not intended to replace advice given to you by your health care provider. Make sure you discuss any questions you have with your health care provider.   Document Released: 04/14/2008 Document Revised: 11/07/2014 Document Reviewed: 03/14/2011 Elsevier Interactive Patient Education [employer]2016 [employer]vier Inc.

## 2015-10-02 ENCOUNTER — Encounter: Payer: PRIVATE HEALTH INSURANCE | Admitting: Family Medicine

## 2015-10-02 LAB — PSA: PSA: 0.25 ng/mL (ref <=4.00)

## 2015-10-08 ENCOUNTER — Telehealth: Payer: Self-pay | Admitting: *Deleted

## 2015-10-08 ENCOUNTER — Other Ambulatory Visit (INDEPENDENT_AMBULATORY_CARE_PROVIDER_SITE_OTHER): Payer: PRIVATE HEALTH INSURANCE

## 2015-10-08 DIAGNOSIS — E291 Testicular hypofunction: Secondary | ICD-10-CM | POA: Diagnosis not present

## 2015-10-08 DIAGNOSIS — Z794 Long term (current) use of insulin: Secondary | ICD-10-CM | POA: Diagnosis not present

## 2015-10-08 DIAGNOSIS — E1165 Type 2 diabetes mellitus with hyperglycemia: Secondary | ICD-10-CM

## 2015-10-08 LAB — COMPREHENSIVE METABOLIC PANEL
ALT: 27 U/L (ref 0–53)
AST: 27 U/L (ref 0–37)
Albumin: 3.9 g/dL (ref 3.5–5.2)
Alkaline Phosphatase: 75 U/L (ref 39–117)
BILIRUBIN TOTAL: 0.4 mg/dL (ref 0.2–1.2)
BUN: 10 mg/dL (ref 6–23)
CHLORIDE: 104 meq/L (ref 96–112)
CO2: 28 meq/L (ref 19–32)
Calcium: 9.1 mg/dL (ref 8.4–10.5)
Creatinine, Ser: 0.99 mg/dL (ref 0.40–1.50)
GFR: 80.91 mL/min (ref >60.00)
Glucose, Bld: 142 mg/dL — ABNORMAL HIGH (ref 70–99)
POTASSIUM: 3.9 meq/L (ref 3.5–5.1)
Sodium: 140 mEq/L (ref 135–145)
Total Protein: 7 g/dL (ref 6.0–8.3)

## 2015-10-08 LAB — TESTOSTERONE: TESTOSTERONE: 223.13 ng/dL — AB (ref 300.00–890.00)

## 2015-10-08 NOTE — Telephone Encounter (Signed)
Pharmacy faxed over an order for patients testosterone, the testosterone 12.5mg /1.25mg  that he was on,  is not covered through his insurance, alternatives are Androderm or Axiron.  Please advise.

## 2015-10-08 NOTE — Telephone Encounter (Signed)
Change AndroGel to Axiron  one pump under each arm.  He is coming in on Tuesday for office visit but need to have his labs checked on Friday or Monday morning for testosterone level, BMP and fructosamine from [facility]

## 2015-10-09 ENCOUNTER — Other Ambulatory Visit: Payer: Self-pay | Admitting: *Deleted

## 2015-10-09 ENCOUNTER — Ambulatory Visit: Payer: PRIVATE HEALTH INSURANCE | Attending: Family Medicine | Admitting: Physical Therapy

## 2015-10-09 DIAGNOSIS — R293 Abnormal posture: Secondary | ICD-10-CM | POA: Diagnosis present

## 2015-10-09 DIAGNOSIS — M7582 Other shoulder lesions, left shoulder: Secondary | ICD-10-CM | POA: Diagnosis present

## 2015-10-09 DIAGNOSIS — M25512 Pain in left shoulder: Secondary | ICD-10-CM | POA: Insufficient documentation

## 2015-10-09 DIAGNOSIS — IMO0002 Reserved for concepts with insufficient information to code with codable children: Secondary | ICD-10-CM

## 2015-10-09 DIAGNOSIS — E1165 Type 2 diabetes mellitus with hyperglycemia: Secondary | ICD-10-CM

## 2015-10-09 DIAGNOSIS — E291 Testicular hypofunction: Secondary | ICD-10-CM

## 2015-10-09 DIAGNOSIS — R29898 Other symptoms and signs involving the musculoskeletal system: Secondary | ICD-10-CM | POA: Insufficient documentation

## 2015-10-09 DIAGNOSIS — Z794 Long term (current) use of insulin: Principal | ICD-10-CM

## 2015-10-09 DIAGNOSIS — M25612 Stiffness of left shoulder, not elsewhere classified: Secondary | ICD-10-CM

## 2015-10-09 LAB — FRUCTOSAMINE: FRUCTOSAMINE: 261 umol/L (ref 0–285)

## 2015-10-09 MED ORDER — TESTOSTERONE 30 MG/ACT TD SOLN: TRANSDERMAL | Status: DC

## 2015-10-09 NOTE — Patient Instructions (Signed)
Cane Exercise: Abduction    Hold cane with left hand over end, palm-up, with other hand palm-down. Move arm out from side and up by pushing with other arm. Hold _1-2___ seconds. Repeat _10-15___ times. Do _2-3___ sessions per day.  http://[email or web address].[email or web address]   Copyright  VHI. All rights reserved.   Scapular Retraction: Bilateral    Facing anchor, pull arms back, bringing shoulder blades together. Repeat __10-15__ times per set. Do _1___ sets per session. Do _2-3__ sessions per day.  http://orth.exer.us/176   Copyright  VHI. All rights reserved.   Flexibility: Corner Stretch    Standing in doorway with hands just above shoulder level and feet staggered, lean forward until a comfortable stretch is felt across chest. Hold _20-30___ seconds. Repeat __2-3__ times per set. Do _1___ sets per session. Do _2-3__ sessions per day.  http://orth.exer.us/342   Copyright  VHI. All rights reserved.

## 2015-10-09 NOTE — Therapy (Addendum)
[place], [place], [address] Phone: [phone]   Fax:  [phone]  Physical Therapy Evaluation  Patient Details  Name: Drew Salinas MRN: [id number] Date of Birth: 04/13/1951 Referring Provider: Dr. Hulan Saas  Encounter Date: 10/09/2015      PT End of Session - 10/09/15 7622    Visit Number 1   Number of Visits 16   Date for PT Re-Evaluation 12/04/15   PT Start Time 0745   PT Stop Time 0823   PT Time Calculation (min) 38 min   Activity Tolerance Patient tolerated treatment well   Behavior During Therapy [facility] for tasks assessed/performed      Past Medical History  Diagnosis Date  . Hypertension   . Hyperlipidemia   . Allergy   . Stroke ([place]) 1993  . Diabetes mellitus 1995  . GERD (gastroesophageal reflux disease)     Past Surgical History  Procedure Laterality Date  . Appendectomy      There were no vitals filed for this visit.  Visit Diagnosis:  Left shoulder pain - Plan: PT plan of care cert/re-cert  Shoulder weakness - Plan: PT plan of care cert/re-cert  Decreased ROM of left shoulder - Plan: PT plan of care cert/re-cert  Abnormal posture - Plan: PT plan of care cert/re-cert      Subjective Assessment - 10/09/15 0749    Subjective Pt is a 64 y/o who presents to OPPT with L frozen shoulder x 3 months.  Pt presents today with difficulty with ROM, ADLs, and pain.   Diagnostic tests x-ray: no fracture   Patient Stated Goals improve pain; improve ROM   Currently in Pain? Yes   Pain Score 5    Pain Location Shoulder   Pain Orientation Left   Pain Descriptors / Indicators Sharp   Pain Type Chronic pain   Pain Onset More than a month ago   Pain Frequency Intermittent   Aggravating Factors  movement, reaching   Pain Relieving Factors avoiding provoking positions            [facility] PT Assessment - 10/09/15 0752    Assessment   Medical Diagnosis L frozen shoulder   Referring  Provider Dr. Hulan Saas   Onset Date/Surgical Date --  3 months   Hand Dominance Left   Next MD Visit 3 weeks   Prior Therapy > 4 years ago for shoulder (unsure of side)   Balance Screen   Has the patient fallen in the past 6 months No   Has the patient had a decrease in activity level because of a fear of falling?  No   Is the patient reluctant to leave their home because of a fear of falling?  No   Prior Function   Level of Independence Independent   Vocation Full time employment   [employer]; mostly desk job   Leisure watch TV   Cognition   Overall Cognitive Status Within Functional Limits for tasks assessed   Observation/Other Assessments   Focus on Therapeutic Outcomes (FOTO)  no completed   Posture/Postural Control   Posture/Postural Control Postural limitations   Postural Limitations Rounded Shoulders;Forward head   AROM   Overall AROM Comments L flexion and IR WNL with pain   AROM Assessment Site Shoulder   Right Shoulder ABduction --   Left Shoulder ABduction 78 Degrees   Left Shoulder External Rotation 73 Degrees   PROM   PROM Assessment Site Shoulder   Left  Shoulder ABduction 100 Degrees   Strength   Strength Assessment Site Shoulder   Right Shoulder Flexion 4/5   Right Shoulder ABduction 4/5   Right Shoulder Internal Rotation 5/5   Right Shoulder External Rotation 4/5   Left Shoulder Flexion 3/5   Left Shoulder ABduction 3-/5   Left Shoulder Internal Rotation 3/5   Left Shoulder External Rotation 3+/5   Palpation   Palpation comment no significant tenderness noted with palpation; mild tenderness along L proximal biceps tendon   Special Tests    Special Tests Rotator Cuff Impingement   Rotator Cuff Impingment tests Michel Bickers test   Hawkins-Kennedy test   Findings Positive   Side Left                   [facility] Adult PT Treatment/Exercise - 10/09/15 0752    Exercises   Exercises Shoulder   Shoulder  Exercises: Standing   ABduction AAROM;Left;15 reps   ABduction Limitations with cane   Retraction Both;15 reps;Theraband   Theraband Level (Shoulder Retraction) Level 3 (Green)   Shoulder Exercises: Stretch   Corner Stretch 3 reps;30 seconds   Corner Stretch Limitations in doorway                PT Education - 10/09/15 [phone]    Education provided Yes   Education Details HEP, clinical findings, goals of care and POC   Person(s) Educated Patient   Methods Explanation;Demonstration;Handout   Comprehension Verbalized understanding;Returned demonstration;Need further instruction          PT Short Term Goals - 10/09/15 0925    PT SHORT TERM GOAL #1   Title independent with HEP (11/06/15)   Time 4   Period Weeks   Status New   PT SHORT TERM GOAL #2   Title verbalize understanding of posture and body mechanics to reduce risk of reinjury (11/06/15)   Time 4   Period Weeks   Status New   PT SHORT TERM GOAL #3   Title improve L shoulder abdct AROM to 100 degrees for improved function (11/06/15)   Time 4   Period Weeks   Status New           PT Long Term Goals - 10/09/15 1191    PT LONG TERM GOAL #1   Title independent with advanced HEP (12/04/15)   Time 8   Period Weeks   Status New   PT LONG TERM GOAL #2   Title perform L shoulder AROM without increase in pain (12/04/15)   Time 8   Period Weeks   Status New   PT LONG TERM GOAL #3   Title improve L shoulder abdct AROM to 120 degrees for improved function (12/04/15)   Time 8   Period Weeks   Status New   PT LONG TERM GOAL #4   Title report 50% improvement in ADLs (12/04/15)   Time 8   Period Weeks   Status New               Plan - 10/09/15 4782    Clinical Impression Statement Pt is a 64 y/o male who presents to OPPT with L frozen shoulder and pain affecting ADLs and job related activities.  Pt demonstrates decreased ROM with abduction and er, as well as pain with all shoulder motions.  Pt will benefit from PT  to maximize ROM, strength and postural awareness to improve ADLs and decrease pain.   Pt will benefit from skilled therapeutic intervention in order  to improve on the following deficits Pain;Postural dysfunction;Decreased strength;Decreased range of motion;Impaired UE functional use   Rehab Potential Good   PT Frequency 2x / week   PT Duration 8 weeks   PT Treatment/Interventions ADLs/Self Care Home Management;Cryotherapy;Electrical Stimulation;Moist Heat;Therapeutic exercise;Therapeutic activities;Functional mobility training;Ultrasound;Taping;Patient/family education;Passive range of motion   PT Next Visit Plan Trial estim, review HEP, posture exercises and L shoulder ROM, manual PRN   Consulted and Agree with Plan of Care Patient         Problem List Patient Active Problem List   Diagnosis Date Noted  . 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 Fracture of scaphoid of right wrist with delayed healing 09/03/2015  . Diabetic peripheral neuropathy associated with type 2 diabetes mellitus ([place]) 05/22/2015  . Essential hypertension, benign 05/21/2015  . Erectile dysfunction 10/02/2013  . Hypogonadism male 10/02/2013  . Insulin dependent type 2 diabetes mellitus, uncontrolled ([place]) 05/23/2012  . Hyperlipidemia 05/23/2012  . Obesity, Class II, BMI 35-39.9, with comorbidity ([place]) 05/23/2012  . Depression 05/23/2012  . Tobacco user 05/23/2012  . History of stroke without residual deficits 05/23/2012   Laureen Abrahams, PT, DPT 10/09/2015 9:30 AM  [facility] [address] [place], [place], [address] Phone: [phone]   Fax:  [phone]  Name: Drew Salinas MRN: [id number] Date of Birth: 1951-07-06   10/14/15: Pt's wife called to cancel appointment and requested to speak to someone prior to next appointment. Pt's wife reported that the first appointment was not as she expected and  wanted to understanding what future appointments would be like. I called Pt's wife Jeannene Patella) back to discuss. She reports the MD wanted the patient to have electrical stimulation. I explained that was part of the plan of care and we are happy to trial in addition to the exercises and other treatment components. I updated the next visit plan to include an e-stim trial and alerted supervising PT and PTA pt is scheduled with next week.  Romualdo Bolk, PT, DPT 10/14/2015 10:21 AM Phone: [phone] Fax: [phone]    PHYSICAL THERAPY DISCHARGE SUMMARY  Visits from Start of Care: 1  Current functional level related to goals / functional outcomes: See above; pt attended initial evaluation only then canceled all remaining appointments   Remaining deficits: Unknown as pt did not return   Education / Equipment: Initiated HEP  Plan: Patient agrees to discharge.  Patient goals were not met. Patient is being discharged due to not returning since the last visit.  ?????    Laureen Abrahams, PT, DPT 12/24/2015 10:34 AM  [facility] [address] [place] [address]  [phone] (office) [phone] (fax)

## 2015-10-09 NOTE — Telephone Encounter (Signed)
Labs ordered, message left on patients answering machine asking him to come in today or Monday for labs.

## 2015-10-12 ENCOUNTER — Ambulatory Visit: Payer: PRIVATE HEALTH INSURANCE | Admitting: Physical Therapy

## 2015-10-13 ENCOUNTER — Other Ambulatory Visit: Payer: Self-pay | Admitting: *Deleted

## 2015-10-13 ENCOUNTER — Encounter: Payer: Self-pay | Admitting: Endocrinology

## 2015-10-13 ENCOUNTER — Ambulatory Visit (INDEPENDENT_AMBULATORY_CARE_PROVIDER_SITE_OTHER): Payer: PRIVATE HEALTH INSURANCE | Admitting: Endocrinology

## 2015-10-13 VITALS — BP 128/70 | HR 90 | Temp 98.3°F | Resp 14 | Ht 71.0 in | Wt 254.2 lb

## 2015-10-13 DIAGNOSIS — E291 Testicular hypofunction: Secondary | ICD-10-CM | POA: Diagnosis not present

## 2015-10-13 DIAGNOSIS — Z794 Long term (current) use of insulin: Secondary | ICD-10-CM

## 2015-10-13 DIAGNOSIS — E1165 Type 2 diabetes mellitus with hyperglycemia: Secondary | ICD-10-CM

## 2015-10-13 DIAGNOSIS — Z23 Encounter for immunization: Secondary | ICD-10-CM

## 2015-10-13 MED ORDER — TADALAFIL 20 MG PO TABS: 20.0000 mg | ORAL_TABLET | Freq: Every day | ORAL | Status: DC | PRN

## 2015-10-13 NOTE — Patient Instructions (Addendum)
Tresiba 90 unit in pm , 85 in am  Check blood sugars on waking up 3  times a week Also check blood sugars about 2 hours after a meal and do this after different meals by rotation  Recommended blood sugar levels on waking up is 90-130 and about 2 hours after meal is 130-160  Please bring your blood sugar monitor to each visit, thank you  Walk 4 days per week  Cialis: 1/2 tab as needed

## 2015-10-13 NOTE — Progress Notes (Signed)
Patient ID: Drew Salinas, male   DOB: 02-May-1951, 64 y.o.   MRN: [id number]   Chief complaint:  followup of diabetes and low testosterone  History of Present Illness:  DIABETES:  He was told to have diabetes several years ago and has been on insulin since about 2006 Previously in [place] he was taking large doses of insulin with Levemir 100 units twice a day along with Novolog 30-36 units before meals.  He prefers to take Levemir with a syringe because of needing large doses He has also been taking Janumet for a few years His A1c had been typically consistently high with this regimen  RECENT history:  INSULIN doses: TRESIBA 85 units twice a day, Novolog 25-30 before meals  He has been switched from Levemir to [facility] in 9/16 Also was started on Jardiance 10 mg daily and Janumet continued. He has had improved control with this and the last A1c is notably better at 7.4, previously around 8% His [place] was reduced by 5 units and also his Novolog since he was having low normal blood sugars at times  Current blood sugar patterns and problems identified:  He has had fairly good fasting readings although not consistently near normal and this may be partly related to his having an upper respiratory infection over the last week or so  He has not checked readings after meals consistently but does have sporadically high readings at lunch or supper time  Fructosamine is upper normal  indicating improved control  He has lost 10 pounds since his last visit  Not exercising despite reminders, he does not have a good place to walk around his neighborhood  Supper 6 pm, has only 2 readings around suppertime  He had a reading of 401 last Friday in the morning but did not recheck it  Mean values apply above for all meters except median for One Touch  PRE-MEAL Fasting Lunch Dinner Bedtime Overall  Glucose range:       Mean/median:        POST-MEAL PC Breakfast PC Lunch PC Dinner    Glucose range:     Mean/median:      Mean values apply above for all meters except median for One Touch  PRE-MEAL Fasting Lunch Dinner Bedtime Overall  Glucose range:  76-155    70-132     Mean/median:      122    POST-MEAL PC Breakfast PC Lunch PC Dinner  Glucose range:  66, 114   137-201   73-257   Mean/median:       Exercise: Trying to walk, less, no formal exercise He has not had a consultation with dietitian in several years Last CDE consultation: 06/2015  Wt Readings from Last 3 Encounters:  10/13/15 254 lb 3.2 oz (115.304 kg)  10/01/15 260 lb 9.6 oz (118.207 kg)  09/28/15 257 lb (116.574 kg)    Lab Results  Component Value Date   HGBA1C 7.4* 08/10/2015   HGBA1C 8.0 05/22/2015   HGBA1C 8.0 01/16/2015   Lab Results  Component Value Date   MICROALBUR 0.8 05/22/2015   LDLCALC 33 10/01/2015   CREATININE 0.99 10/08/2015    HYPOGONADISM: He was seen in consultation in 9/14 for evaluation of a low testosterone level. This was initially tested because of erectile dysfunction At that time his libido had been fairly good and also did not complain of any unusual fatigue, poor muscle endurance,  lack of motivation or depression Evaluation confirmed a low free testosterone level  as well as normal prolactin. LH level was mildly increased  Because of documented hypogonadism he was started on Androderm 4 mg patch  However because of skin irritation with the patch and inconsistent adherence he was changed to AndroGel Apparently in 2015 this was changed to [place] by his PCP   Testosterone levels: Baseline 151, after starting AndroGel, 420 in 12/14  Subjectively with testosterone supplementation he previously had more energy level and mood was improved. With AndroGel he has improved his symptoms modestly but still does not have normal energy His level is still actively low with using 3 pumps daily He is now supposed to switch to  Axiron because of insurance  preference    Lab Results  Component Value Date   TESTOSTERONE 223.13* 10/08/2015        Medication List       This list is accurate as of: 10/13/15  9:38 AM.  Always use your most recent med list.               amLODipine 5 MG tablet  Commonly known as:  NORVASC  Take 1 tablet (5 mg total) by mouth daily.     atorvastatin 10 MG tablet  Commonly known as:  LIPITOR  Take 1 tablet (10 mg total) by mouth daily at 6 PM.     buPROPion 300 MG 24 hr tablet  Commonly known as:  WELLBUTRIN XL  Take 1 tablet (300 mg total) by mouth daily.     clopidogrel 75 MG tablet  Commonly known as:  PLAVIX  Take 1 tablet (75 mg total) by mouth daily.     clotrimazole 1 % cream  Commonly known as:  LOTRIMIN  Apply 1 application topically 2 (two) times daily. To feet x 2 wks     empagliflozin 10 MG Tabs tablet  Commonly known as:  JARDIANCE  Take 10 mg by mouth daily.     glucose blood test strip  Commonly known as:  ONE TOUCH ULTRA TEST  Use as instructed to check blood sugar 3 times per day dx code E11.65     ibuprofen 800 MG tablet  Commonly known as:  ADVIL,MOTRIN  TAKE 1 TABLET BY MOUTH 3 TIMES A DAY X 7 DAYS,THEN ONLY AS NEEDED     insulin aspart 100 UNIT/ML injection  Commonly known as:  NOVOLOG  INJECT 30-40 UNITS INTO THE SKIN 3 TIMES A DAY WITH MEALS by sliding scale     Insulin Degludec 200 UNIT/ML Sopn  Commonly known as:  TRESIBA FLEXTOUCH  Inject 100 Units into the skin 2 (two) times daily.     Insulin Pen Needle 32G X 4 MM Misc  Use 3 per day to inject insulin     Insulin Syringe-Needle U-100 31G X 5/16" 1 ML Misc  Commonly known as:  B-D INS SYR ULTRAFINE 1CC/31G  Use as directed 4-6 times daily.     JANUMET 50-1000 MG tablet  Generic drug:  sitaGLIPtin-metformin  TAKE 1 TABLET BY MOUTH 2 (TWO) TIMES DAILY.     tadalafil 20 MG tablet  Commonly known as:  CIALIS  Take 1 tablet (20 mg total) by mouth daily as needed for erectile dysfunction.      Testosterone 30 MG/ACT Soln  Apply one pump under each arm daily     traZODone 100 MG tablet  Commonly known as:  DESYREL  TAKE 1 TABLET BY MOUTH AT BEDTIME     Vitamin D (Ergocalciferol) 50000 UNITS Caps capsule  Commonly known as:  DRISDOL  Take 1 capsule (50,000 Units total) by mouth every 7 (seven) days.     zoster vaccine live (PF) 19400 UNT/0.65ML injection  Commonly known as:  ZOSTAVAX  Inject 19,400 Units into the skin once.        Allergies:  Allergies  Allergen Reactions  . Lisinopril Cough    Past Medical History  Diagnosis Date  . Hypertension   . Hyperlipidemia   . Allergy   . Stroke (HCC) 1993  . Diabetes mellitus 1995  . GERD (gastroesophageal reflux disease)     Past Surgical History  Procedure Laterality Date  . Appendectomy      Family History  Problem Relation Age of Onset  . Diabetes Mother   . Diabetes Brother     Social History:  reports that he has been smoking Cigarettes.  He has a 22.5 pack-year smoking history. He has never used smokeless tobacco. He reports that he drinks about 1.2 oz of alcohol per week. He reports that he does not use illicit drugs.  REVIEW of systems:   He is having upper respiratory symptoms but these seem to be improving now  He has history of hyperlipidemia treated with Lipitor  Lab Results  Component Value Date   CHOL 107* 10/01/2015   HDL 46 10/01/2015   LDLCALC 33 10/01/2015   TRIG 141 10/01/2015   CHOLHDL 2.3 10/01/2015    History of hypertension treated with amlodipine only  ERECTILE dysfunction: Currently untreated and he is desiring medication and is asking about efficacy, side effects and cost  Taking Plavix for history of stroke with no residual deficit  LABS:   Lab on 10/08/2015  Component Date Value Ref Range Status  . Testosterone 10/08/2015 223.13* 300.00 - 890.00 ng/dL Final  . Sodium 16/10/960412/06/2015 140  135 - 145 mEq/L Final  . Potassium 10/08/2015 3.9  3.5 - 5.1 mEq/L Final  .  Chloride 10/08/2015 104  96 - 112 mEq/L Final  . CO2 10/08/2015 28  19 - 32 mEq/L Final  . Glucose, Bld 10/08/2015 142* 70 - 99 mg/dL Final  . BUN 54/09/811912/06/2015 10  6 - 23 mg/dL Final  . Creatinine, Ser 10/08/2015 0.99  0.40 - 1.50 mg/dL Final  . Total Bilirubin 10/08/2015 0.4  0.2 - 1.2 mg/dL Final  . Alkaline Phosphatase 10/08/2015 75  39 - 117 U/L Final  . AST 10/08/2015 27  0 - 37 U/L Final  . ALT 10/08/2015 27  0 - 53 U/L Final  . Total Protein 10/08/2015 7.0  6.0 - 8.3 g/dL Final  . Albumin 14/78/295612/06/2015 3.9  3.5 - 5.2 g/dL Final  . Calcium 21/30/865712/06/2015 9.1  8.4 - 10.5 mg/dL Final  . GFR 84/69/629512/06/2015 80.91  >60.00 mL/min Final  . Fructosamine 10/08/2015 261  0 - 285 umol/L Final   Comment: Published reference interval for apparently healthy subjects between age 63 and 3260 is 66205 - 285 umol/L and in a poorly controlled diabetic population is 228 - 563 umol/L with a mean of 396 umol/L.   Orders Only on 10/01/2015  Component Date Value Ref Range Status  . HCV Ab 10/01/2015 NEGATIVE  NEGATIVE Final  Office Visit on 10/01/2015  Component Date Value Ref Range Status  . PSA 10/01/2015 0.25  <=4.00 ng/mL Final   Comment: Test Methodology: ECLIA PSA (Electrochemiluminescence Immunoassay)   For PSA values from 2.5-4.0, particularly in younger men <663 years old, the AUA and NCCN suggest testing for % Free PSA (3515)  and evaluation of the rate of increase in PSA (PSA velocity).   . TSH 10/01/2015 1.868  0.350 - 4.500 uIU/mL Final  . HIV 1&2 Ab, 4th Generation 10/01/2015 NONREACTIVE  NONREACTIVE Final   Comment:   HIV-1 antigen and HIV-1/HIV-2 antibodies were not detected.  There is no laboratory evidence of HIV infection.   HIV-1/2 Antibody Diff        Not indicated. HIV-1 RNA, Qual TMA          Not indicated.     PLEASE NOTE: This information has been disclosed to you from records whose confidentiality may be protected by state law. If your state requires such protection, then the state  law prohibits you from making any further disclosure of the information without the specific written consent of the person to whom it pertains, or as otherwise permitted by law. A general authorization for the release of medical or other information is NOT sufficient for this purpose.   The performance of this assay has not been clinically validated in patients less than 56 years old.   For additional information please refer to http://education.questdiagnostics.com/faq/FAQ106.  (This link is being provided for informational/educational purposes only.)     . Sodium 10/01/2015 140  135 - 146 mmol/L Final  . Potassium 10/01/2015 4.1  3.5 - 5.3 mmol/L Final  . Chloride 10/01/2015 104  98 - 110 mmol/L Final  . CO2 10/01/2015 25  20 - 31 mmol/L Final  . Glucose, Bld 10/01/2015 80  65 - 99 mg/dL Final  . BUN 16/08/9603 9  7 - 25 mg/dL Final  . Creat 54/07/8118 1.00  0.70 - 1.25 mg/dL Final  . Total Bilirubin 10/01/2015 0.5  0.2 - 1.2 mg/dL Final  . Alkaline Phosphatase 10/01/2015 75  40 - 115 U/L Final  . AST 10/01/2015 32  10 - 35 U/L Final  . ALT 10/01/2015 26  9 - 46 U/L Final  . Total Protein 10/01/2015 6.7  6.1 - 8.1 g/dL Final  . Albumin 14/78/2956 4.1  3.6 - 5.1 g/dL Final  . Calcium 21/30/8657 8.9  8.6 - 10.3 mg/dL Final  . WBC 84/69/6295 8.5  4.0 - 10.5 K/uL Final  . RBC 10/01/2015 4.55  4.22 - 5.81 MIL/uL Final  . Hemoglobin 10/01/2015 14.2  13.0 - 17.0 g/dL Final  . HCT 28/41/3244 41.5  39.0 - 52.0 % Final  . MCV 10/01/2015 91.2  78.0 - 100.0 fL Final  . MCH 10/01/2015 31.2  26.0 - 34.0 pg Final  . MCHC 10/01/2015 34.2  30.0 - 36.0 g/dL Final  . RDW 11/02/7251 13.5  11.5 - 15.5 % Final  . Platelets 10/01/2015 163  150 - 400 K/uL Final  . MPV 10/01/2015 11.4  8.6 - 12.4 fL Final  . Color, UA 10/01/2015 yellow  yellow Final  . Clarity, UA 10/01/2015 clear  clear Final  . Glucose, UA 10/01/2015 =500* negative Final  . Bilirubin, UA 10/01/2015 negative  negative Final  .  Ketones, POC UA 10/01/2015 negative  negative Final  . Spec Grav, UA 10/01/2015 1.020   Final  . Blood, UA 10/01/2015 trace-intact* negative Final  . pH, UA 10/01/2015 5.5   Final  . Protein Ur, POC 10/01/2015 negative  negative Final  . Urobilinogen, UA 10/01/2015 0.2   Final  . Nitrite, UA 10/01/2015 Negative  Negative Final  . Leukocytes, UA 10/01/2015 Negative  Negative Final  . Cholesterol 10/01/2015 107* 125 - 200 mg/dL Final  . Triglycerides 10/01/2015 141  <  150 mg/dL Final  . HDL 16/08/9603 46  >=40 mg/dL Final  . Total CHOL/HDL Ratio 10/01/2015 2.3  <=5.4 Ratio Final  . VLDL 10/01/2015 28  <30 mg/dL Final  . LDL Cholesterol 10/01/2015 33  <130 mg/dL Final   Comment:   Total Cholesterol/HDL Ratio:CHD Risk                        Coronary Heart Disease Risk Table                                        Men       Women          1/2 Average Risk              3.4        3.3              Average Risk              5.0        4.4           2X Average Risk              9.6        7.1           3X Average Risk             23.4       11.0 Use the calculated Patient Ratio above and the CHD Risk table  to determine the patient's CHD Risk.      General Examination:   BP 128/70 mmHg  Pulse 90  Temp(Src) 98.3 F (36.8 C) (Oral)  Resp 14  Ht 5\' 11"  (1.803 m)  Wt 254 lb 3.2 oz (115.304 kg)  BMI 35.47 kg/m2  SpO2 95%   Assessment/ Plan:  DIABETES type II with obesity:  See history of present illness for detailed discussion of his current management, blood sugar patterns and problems identified  He has overall fairly good control of his diabetes with using Jardiance in switching Levemir to [place] He is needing less insulin now overall since his last visit However he has not checked his blood sugars much especially after meals He does have relatively high readings at times based on his diet, recent upper respiratory infection and lack of exercise  He does admit that he can do  much better with exercise which helps his blood sugar control usually However he is able to lose weight with continuing Jardiance in reducing his insulin Fructosamine overall indicate fairly good control  Currently fasting readings are mildly increased and he can go up at least 5 units in the evening dose of Tresiba Continue to adjust mealtime doses based on meal size, carbohydrates and to keep post bunion readings at least under 180  HYPERTENSION: Blood pressure is fairly good Consider ACE inhibitor   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  Discussed how this works, dosage, duration of action and possible side effects He can try 10 mg using a half tablet of 20 mg to start with  Influenza vaccine given  Patient Instructions  Tresiba 90 unit in pm , 85 in am  Check blood sugars on waking up 3  times a week Also check blood sugars about 2 hours after a meal and do this after different meals by rotation  Recommended blood sugar levels on waking up is 90-130 and about 2 hours after meal is 130-160  Please bring your blood sugar monitor to each visit, thank you  Walk 4 days per week  Cialis: 1/2 tab as needed      Counseling time on subjects discussed above is over 50% of today's 25 minute visit    [facility]  10/13/2015 9:38 AM  Note: This office note was prepared with Dragon voice recognition system  technology. Any transcriptional errors that result from this process are unintentional.

## 2015-10-14 ENCOUNTER — Ambulatory Visit: Payer: PRIVATE HEALTH INSURANCE | Admitting: Physical Therapy

## 2015-10-19 ENCOUNTER — Encounter: Payer: Self-pay | Admitting: Family Medicine

## 2015-10-19 ENCOUNTER — Telehealth: Payer: Self-pay | Admitting: *Deleted

## 2015-10-19 ENCOUNTER — Other Ambulatory Visit (INDEPENDENT_AMBULATORY_CARE_PROVIDER_SITE_OTHER): Payer: PRIVATE HEALTH INSURANCE

## 2015-10-19 ENCOUNTER — Ambulatory Visit (INDEPENDENT_AMBULATORY_CARE_PROVIDER_SITE_OTHER): Payer: PRIVATE HEALTH INSURANCE | Admitting: Family Medicine

## 2015-10-19 VITALS — BP 124/70 | HR 87 | Ht 71.0 in | Wt 254.0 lb

## 2015-10-19 DIAGNOSIS — S62024G Nondisplaced fracture of middle third of navicular [scaphoid] bone of right wrist, subsequent encounter for fracture with delayed healing: Secondary | ICD-10-CM | POA: Diagnosis not present

## 2015-10-19 DIAGNOSIS — M654 Radial styloid tenosynovitis [de Quervain]: Secondary | ICD-10-CM | POA: Diagnosis not present

## 2015-10-19 DIAGNOSIS — E11618 Type 2 diabetes mellitus with other diabetic arthropathy: Secondary | ICD-10-CM

## 2015-10-19 DIAGNOSIS — M25531 Pain in right wrist: Secondary | ICD-10-CM

## 2015-10-19 DIAGNOSIS — M7500 Adhesive capsulitis of unspecified shoulder: Secondary | ICD-10-CM

## 2015-10-19 MED ORDER — NITROGLYCERIN 0.2 MG/HR TD PT24: MEDICATED_PATCH | TRANSDERMAL | Status: DC

## 2015-10-19 MED ORDER — VITAMIN D (ERGOCALCIFEROL) 1.25 MG (50000 UNIT) PO CAPS: 50000.0000 [IU] | ORAL_CAPSULE | ORAL | Status: DC

## 2015-10-19 NOTE — Assessment & Plan Note (Signed)
Would like patient to start mobilizing the wrist more. New exercises given. Trial of topical anti-inflammatories given. We discussed icing protocol. Patient will come back again in 3 weeks. If worsening symptoms we'll consider injection under ultrasound.

## 2015-10-19 NOTE — Telephone Encounter (Signed)
Discussed with patient in great length  He will not take cilias.

## 2015-10-19 NOTE — Patient Instructions (Addendum)
Good to see you  Happy holidays!  The wrist looks great but new tendonitis.  New exercises for the wrist Wear the brace as much as you want but start trying to go without it when you can.  Ice is your friend pennsaid pinkie amount topically 2 times daily as needed.  For the shoulder we will try nitro Nitroglycerin Protocol   Apply 1/4 nitroglycerin patch to affected area daily.  Change position of patch within the affected area every 24 hours.  You may experience a headache during the first 1-2 weeks of using the patch, these should subside.  If you experience headaches after beginning nitroglycerin patch treatment, you may take your preferred over the counter pain reliever.  Another side effect of the nitroglycerin patch is skin irritation or rash related to patch adhesive.  Please notify our office if you develop more severe headaches or rash, and stop the patch.  Tendon healing with nitroglycerin patch may require 12 to 24 weeks depending on the extent of injury.  Men should not use if taking Viagra, Cialis, or Levitra.   Do not use if you have migraines or rosacea.  See me again in 3-4 weeks

## 2015-10-19 NOTE — Telephone Encounter (Signed)
Pharmacist left msg on triage stating received script Nitroglycerin Patches, but their is a contradiction with his medication Cialis. Wanting to know does md want to go head and fill or rx something else...Raechel Chute/lmb

## 2015-10-19 NOTE — Telephone Encounter (Signed)
Notified Pharmacist/natalie gave md response...Drew Salinas/lmb

## 2015-10-19 NOTE — Assessment & Plan Note (Signed)
Patient has increasing range of motion from previous exam. I think he is doing relatively well overall. I do think the patient may have some intersubstance tearing of the rotator cuff that was not seen on ultrasound previously. We did discuss with patient about possible repeat injection which patient declined. Patient will start on nitroglycerin patches. Warned the potential side effects. Do think she will do well. Patient does have a prescription for cialis but does not take it regularly. We were adamant that he cannot take both medications otherwise he will have an unsafe drop in blood pressure. Patient will continue the vitamin D supplementation. Would like to see patient back again in 3 weeks for further evaluation.

## 2015-10-19 NOTE — Assessment & Plan Note (Signed)
Good healing seen today on ultrasound. Patient does have callus formation. I would like to refill patient's vitamin D supplementation to once weekly for another 8-12 weeks. We discussed potential side effects. Patient is going to start increasing his range of motion and wearing the thumb spica less. We discussed icing regimen. Patient will do topical anti-inflammatories and we'll see patient back again in 3-4 weeks

## 2015-10-19 NOTE — Progress Notes (Signed)
[facility] [address] [place], [place] [address] Phone: [phone] Subjective:    I'm seeing this patient by the request  of:  SHAW,EVA, MD Lucianne Muss  CC: Left shoulder pain,  right wrist pain follow up  [id number] Drew Salinas is a 64 y.o. male coming in with complaint of left shoulder pain. Patient was initially seen in urgent care on September 30. Patient was diagnosed with a rotator cuff sprain. Patient saw me and had some early adhesive capsulitis. Patient was given an injection. Patient is making significant improvement with range of motion but continued to have pain. Patient was sent to formal physical therapy. Patient states healing well one time to physical therapy because he did not because beneficial. We discussed icing regimen and home exercises. Patient states that he continues to have the same fairly more of a discomfort. States not noticing as much weakness. Does not seem to be getting worse but very minimally getting better. No radiation down the arm or numbness or tingling. No nighttime awakening.  He also is complaining of right wrist pain. Was found to have possible fracture of the middle third of the scaphoid on ultrasound. We do not see any significant healing. Patient was to be scheduled for the potential of an MRI. Patient was to continue with conservative therapy including bracing. Patient states the wrist is felt better since he has been in the immobilizing brace. Has been wearing it relatively regularly. States that he does still have some discomfort with certain movements but seems to be less severe than what it was previously.     Past medical history, social, surgical and family history all reviewed in electronic medical record.   Review of Systems: No headache, visual changes, nausea, vomiting, diarrhea, constipation, dizziness, abdominal pain, skin rash, fevers, chills, night sweats, weight loss, swollen lymph nodes, body aches,  joint swelling, muscle aches, chest pain, shortness of breath, mood changes.   Objective Blood pressure 124/70, pulse 87, height  (1.803 m), weight 254 lb (115.214 kg), SpO2 94 %.  General: No apparent distress alert and oriented x3 mood and affect normal, dressed appropriately.  HEENT: Pupils equal, extraocular movements intact  Respiratory: Patient's speak in full sentences and does not appear short of breath  Cardiovascular: No lower extremity edema, non tender, no erythema  Skin: Warm dry intact with no signs of infection or rash on extremities or on axial skeleton.  Abdomen: Soft nontender  Neuro: Cranial nerves II through XII are intact, neurovascularly intact in all extremities with 2+ DTRs and 2+ pulses.  Lymph: No lymphadenopathy of posterior or anterior cervical chain or axillae bilaterally.  Gait normal with good balance and coordination.  MSK:  Non tender with full range of motion and good stability and symmetric strength and tone of  elbows,  hip, knee and ankles bilaterally.  Shoulder: left Inspection reveals no abnormalities, atrophy or asymmetry. Palpation is normal with no tenderness over AC joint or bicipital groove. Rotator cuff strength normal throughout. signs of impingement with positive Neer and Hawkin's tests, but negative empty can sign. Range of motion is near full. Speeds and Yergason's tests normal. No labral pathology noted with negative Obrien's, negative clunk and good stability. Normal scapular function observed. No painful arc and no drop arm sign. No apprehension sign  Wrist: Right Inspection normal with no visible erythema or swelling. ROM smooth and normal with good flexion and extension and ulnar/radial deviation that is symmetrical with opposite wrist. Palpation is  normal over metacarpals, navicular, lunate, and TFCC; tendons without tenderness/ swelling Mild snuff box tenderness. No tenderness over Canal of [place]. Strength 5/5 in all  directions without pain. Positive Finkelstein, negative tinel's and phalens. Negative Watson's test.   MSK US performed of: Right wrist This study was ordered, performed, and interpreted by Terrilee FilesZach Smith D.O.  Wrist: Abductor pollicis longus tendon does have hypoechoic changes with increasing Doppler flow but no true interstitial tendon tearing appreciated. TFCC intact. Scapholunate ligament intact. Carpal tunnel visualized and median nerve area normal, flexor tendons all normal in appearance without fraying, tears, or sheath effusions. Power doppler signal normal. Scaphoid bone on the dorsal radial side of the wrist show that patient has callus formation in the mid proximal pole. Good increase in Doppler flow. Seems to be approximately 85% healed.  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

## 2015-10-19 NOTE — Progress Notes (Signed)
Pre visit review using our clinic review tool, if applicable. No additional management support is needed unless otherwise documented below in the visit note. 

## 2015-10-20 ENCOUNTER — Ambulatory Visit: Payer: PRIVATE HEALTH INSURANCE | Admitting: Physical Therapy

## 2015-10-22 ENCOUNTER — Other Ambulatory Visit: Payer: Self-pay | Admitting: Physician Assistant

## 2015-10-22 ENCOUNTER — Other Ambulatory Visit: Payer: Self-pay | Admitting: Family Medicine

## 2015-10-27 ENCOUNTER — Other Ambulatory Visit: Payer: Self-pay | Admitting: *Deleted

## 2015-10-27 MED ORDER — JANUMET 50-1000 MG PO TABS: ORAL_TABLET | ORAL | Status: DC

## 2015-10-28 ENCOUNTER — Encounter: Payer: PRIVATE HEALTH INSURANCE | Admitting: Physical Therapy

## 2015-10-29 ENCOUNTER — Other Ambulatory Visit: Payer: Self-pay | Admitting: *Deleted

## 2015-10-29 ENCOUNTER — Other Ambulatory Visit: Payer: Self-pay | Admitting: Physician Assistant

## 2015-10-29 ENCOUNTER — Other Ambulatory Visit: Payer: Self-pay | Admitting: Family Medicine

## 2015-10-29 MED ORDER — JANUMET 50-1000 MG PO TABS: ORAL_TABLET | ORAL | Status: DC

## 2015-10-29 NOTE — Telephone Encounter (Signed)
Pt is calling to request janumet and trazadone. appt w/ kumar in

## 2015-10-29 NOTE — Telephone Encounter (Signed)
Refill done.  

## 2015-10-30 ENCOUNTER — Telehealth: Payer: Self-pay | Admitting: Endocrinology

## 2015-10-30 NOTE — Telephone Encounter (Signed)
rx was sent yesterday, message left on their home number notifying them.

## 2015-10-30 NOTE — Telephone Encounter (Signed)
Patient wife stated PCP Dr Clelia CroftShaw will no longer prescribe patient Janumet medication, she is asking if Dr Lucianne MussKumar will send a refill to his pharmacy, please advise [id number] [place], [place] - [address] [facility] [address] [phone] (Phone) [phone] (Fax)

## 2015-11-03 ENCOUNTER — Encounter: Payer: PRIVATE HEALTH INSURANCE | Admitting: Physical Therapy

## 2015-11-05 ENCOUNTER — Encounter: Payer: PRIVATE HEALTH INSURANCE | Admitting: Physical Therapy

## 2015-11-06 ENCOUNTER — Other Ambulatory Visit: Payer: Self-pay | Admitting: Endocrinology

## 2015-11-13 ENCOUNTER — Other Ambulatory Visit (INDEPENDENT_AMBULATORY_CARE_PROVIDER_SITE_OTHER): Payer: PRIVATE HEALTH INSURANCE

## 2015-11-13 ENCOUNTER — Encounter: Payer: Self-pay | Admitting: Family Medicine

## 2015-11-13 ENCOUNTER — Ambulatory Visit (INDEPENDENT_AMBULATORY_CARE_PROVIDER_SITE_OTHER): Payer: PRIVATE HEALTH INSURANCE | Admitting: Family Medicine

## 2015-11-13 VITALS — BP 136/72 | HR 85 | Wt 269.0 lb

## 2015-11-13 DIAGNOSIS — E11618 Type 2 diabetes mellitus with other diabetic arthropathy: Secondary | ICD-10-CM | POA: Diagnosis not present

## 2015-11-13 DIAGNOSIS — M654 Radial styloid tenosynovitis [de Quervain]: Secondary | ICD-10-CM

## 2015-11-13 DIAGNOSIS — S62024G Nondisplaced fracture of middle third of navicular [scaphoid] bone of right wrist, subsequent encounter for fracture with delayed healing: Secondary | ICD-10-CM

## 2015-11-13 DIAGNOSIS — M7500 Adhesive capsulitis of unspecified shoulder: Secondary | ICD-10-CM | POA: Diagnosis not present

## 2015-11-13 DIAGNOSIS — M25512 Pain in left shoulder: Secondary | ICD-10-CM | POA: Diagnosis not present

## 2015-11-13 NOTE — Patient Instructions (Signed)
Good to see you Ice is your friend Injected the wrist today Wear brace day and night for 2 days then nightly for 2 weeks For the shoulder we will get an MRA I will call you with the results Lets hold on follow up for now.

## 2015-11-13 NOTE — Progress Notes (Signed)
[name] D.O. [facility] [address] [place], [place] [address] Phone: [phone] Subjective:     CC: Left shoulder pain,  right wrist pain follow up  NFA:OZHYQMVHQIHPI:Subjective Drew Salinas is a 65 y.o. male coming in with complaint of left shoulder pain. Patient was initially seen in urgent care on September 30. Patient was diagnosed with a rotator cuff sprain. Patient saw me and had some early adhesive capsulitis. Patient was given an injection. Patient is making significant improvement with range of motion but continued to have pain. Patient was sent to formal physical therapy. Patient states healing well one time to physical therapy because he did not because beneficial.  Patient elected instead of another injection to try the entry glycerin patches. Patient was to continue conservative therapy with home exercises. Patient states no significant shoulder improvement. Continues to have a dull throbbing aching sensation. Does affects some of his daily activities. Still waking him up at night. No side effects to the nitroglycerin but would not states that he is made any improvement either. Potentially worsening  He also is complaining of right wrist pain. Was found to have possible fracture of the middle third of the scaphoid on ultrasound. We do not see any significant healing. Patient was to be scheduled for the potential of an MRI. Patient was to continue with conservative therapy including bracing. Patient states the wrist is felt better since he has been in the immobilizing brace.= Patient was found to have good callus formation over the scaphoid bone with good healing. Patient was to start increasing some activity because he did have a new de Quervain's tenosynovitis was diagnosed. Patient given different exercises and topical anti-inflammatories. Patient states the severe pain has improved but continues to have the pain with certain movements. Patient has been wearing the brace fairly  religiously. Patient is on the topical anti-inflammatories as well as the home exercises. State a very minimal improvement would state that now he is approximately a 50% of his baseline.    Past Medical History  Diagnosis Date  . Hypertension   . Hyperlipidemia   . Allergy   . Stroke (HCC) 1993  . Diabetes mellitus 1995  . GERD (gastroesophageal reflux disease)    Past Surgical History  Procedure Laterality Date  . Appendectomy     Social History   Social History  . Marital Status: Married    Spouse Name: N/A  . Number of Children: 3  . 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 Smoking status: Current Some Day Smoker -- 0.50 packs/day for 45 years    Types: Cigarettes    Last Attempt to Quit: 07/05/2013  . Smokeless tobacco: Never Used     Comment: quit on 07/05/2013  . Alcohol Use: 1.2 oz/week    2 Cans of beer per week  . Drug Use: No  . Sexual Activity: Yes   Other Topics Concern  . Not on file   Social History Narrative   Allergies  Allergen Reactions  . Lisinopril Cough   Family History  Problem Relation Age of Onset  . Diabetes Mother   . Diabetes Brother      Review of Systems: No headache, visual changes, nausea, vomiting, diarrhea, constipation, dizziness, abdominal pain, skin rash, fevers, chills, night sweats, weight loss, swollen lymph nodes, body aches, joint swelling, muscle aches, chest pain, shortness of breath, mood changes.   Objective Blood pressure  136/72, pulse 85, weight 269 lb (122.018 kg), SpO2 96 %.  General: No apparent distress alert and oriented x3 mood and affect normal, dressed appropriately.  HEENT: Pupils equal, extraocular movements intact  Respiratory: Patient's speak in full sentences and does not appear short of breath  Cardiovascular: No lower extremity edema, non tender, no erythema  Skin: Warm dry intact with no signs of infection or rash on extremities or  on axial skeleton.  Abdomen: Soft nontender  Neuro: Cranial nerves II through XII are intact, neurovascularly intact in all extremities with 2+ DTRs and 2+ pulses.  Lymph: No lymphadenopathy of posterior or anterior cervical chain or axillae bilaterally.  Gait normal with good balance and coordination.  MSK:  Non tender with full range of motion and good stability and symmetric strength and tone of  elbows,  hip, knee and ankles bilaterally.  Shoulder: left Inspection reveals no abnormalities, atrophy or asymmetry. Palpation is normal with no tenderness over AC joint or bicipital groove. With adequate strength 4 out of 5 compared to 5 out of 5 on the contralateral side signs of impingement with positive Neer and Hawkin's tests, but negative empty can sign. Range of motion is near full. Speeds and Yerg positive labral pathology with positive O'Brien'sason's tests normal.  Normal scapular function observed. No painful arc and no drop arm sign. No apprehension sign  Wrist: Right Inspection normal with no visible erythema or swelling. ROM smooth and normal with good flexion and extension and ulnar/radial deviation that is symmetrical with opposite wrist. Palpation is normal over metacarpals, navicular, lunate, and TFCC; tendons without tenderness/ swelling Nontender over snuff box tenderness. No tenderness over Canal of [place]. Strength 5/5 in all directions without pain. Positive Finkelstein, negative tinel's and phalens. Negative Watson's test. Contralateral wrist unremarkable  MSK US performed of: Right wrist This study was ordered, performed, and interpreted by Terrilee Files D.O.  Wrist: Abductor pollicis longus tendon does have hypoechoic changes with increasing Doppler flow but no true interstitial tendon tearing appreciated. TFCC intact. Scapholunate ligament intact.  Carpal tunnel visualized and median nerve area normal, flexor tendons all normal in appearance without fraying,  tears, or sheath effusions. Power doppler signal normal. Scaphoid bone on the dorsal radial side of the wrist show that patient has callus formation in the mid proximal pole even more than previous exam. Patient continues to have a positive Finkelstein's and does have hypoechoic changes of the tendon sheath  IMPRESSION:  Mostly healed scaphoid fracture but continued de Quervain's tenosynovitis   Procedure: Real-time Ultrasound Guided Injection of right Abductor pollicis longs tendon sheath Device: GE Logiq E  Ultrasound guided injection is preferred based studies that show increased duration, increased effect, greater accuracy, decreased procedural pain, increased response rate with ultrasound guided versus blind injection.  Verbal informed consent obtained.  Time-out conducted.  Noted no overlying erythema, induration, or other signs of local infection.  Skin prepped in a sterile fashion.  Local anesthesia: Topical Ethyl chloride.  With sterile technique and under real time ultrasound guidance:  tendon visualized.  23g 5/8 inch needle inserted distal to proximal approach into tendon sheath. Pictures taken  for needle placement. Patient did have injection of 2 cc of 1% lidocaine, 1 cc of 0.5% Marcaine, and 0.5 cc of Kenalog 40 mg/dL. Completed without difficulty  Pain immediately resolved suggesting accurate placement of the medication.  Advised to call if fevers/chills, erythema, induration, drainage, or persistent bleeding.  Images permanently stored and available for review in the ultrasound unit.  Impression: Technically successful ultrasound guided injection.    Impression and Recommendations:     This case required medical decision making of moderate complexity.

## 2015-11-13 NOTE — Assessment & Plan Note (Signed)
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 Patient is having some mild increase in weakness and worsening of symptoms over the course of time here. Has failed formal physical therapy, icing protocol, topical anti-inflammatories and even nitroglycerin patches. This would respond to failure of all conservative therapy. At this time I do feel that advance imaging is warranted. I would like to rule out also labral pathology that could contribute to his symptoms and could give the same presentation on exam. At this time we will get an MR arthrogram. Depending on findings we'll discuss medical management for possible surgical intervention.

## 2015-11-13 NOTE — Assessment & Plan Note (Signed)
Patient had injection today with complete resolution of pain. We discussed icing regimen and a short course of 23 hour bracing for the next 48 hours and then nightly for 2 weeks. Patient will continue home exercises. Has declined formal physical therapy in the past. Has topical anti-inflammatories. Patient will come back again at a later date if necessary.

## 2015-11-13 NOTE — Assessment & Plan Note (Signed)
Well-healed follow-up as needed

## 2015-11-26 ENCOUNTER — Other Ambulatory Visit: Payer: PRIVATE HEALTH INSURANCE

## 2015-11-26 DIAGNOSIS — E291 Testicular hypofunction: Secondary | ICD-10-CM

## 2015-11-27 LAB — TESTOSTERONE: TESTOSTERONE: 254 ng/dL — AB (ref 348–1197)

## 2015-12-07 ENCOUNTER — Ambulatory Visit (INDEPENDENT_AMBULATORY_CARE_PROVIDER_SITE_OTHER): Payer: PRIVATE HEALTH INSURANCE | Admitting: Endocrinology

## 2015-12-07 ENCOUNTER — Encounter: Payer: Self-pay | Admitting: Endocrinology

## 2015-12-07 VITALS — BP 138/64 | HR 83 | Temp 98.3°F | Resp 16 | Ht 71.0 in | Wt 255.0 lb

## 2015-12-07 DIAGNOSIS — E291 Testicular hypofunction: Secondary | ICD-10-CM

## 2015-12-07 DIAGNOSIS — Z794 Long term (current) use of insulin: Secondary | ICD-10-CM

## 2015-12-07 DIAGNOSIS — E1165 Type 2 diabetes mellitus with hyperglycemia: Secondary | ICD-10-CM

## 2015-12-07 NOTE — Patient Instructions (Addendum)
Reduce Tresiba to 85 in pm  Target 90-120 before Bfst or supper  Increase walking, can reduce am dose to 85 with this daily  Check [email or web address]  ? Sugar at 8-9pm

## 2015-12-07 NOTE — Progress Notes (Addendum)
Patient ID: Drew Salinas, male   DOB: Aug 29, 1951, 65 y.o.   MRN: [id number]   Chief complaint:  followup of diabetes and low testosterone  History of Present Illness:  DIABETES:  He was told to have diabetes several years ago and has been on insulin since about 2006 Previously in [place] he was taking large doses of insulin with Levemir 100 units twice a day along with Novolog 30-36 units before meals.  He prefers to take Levemir with a syringe because of needing large doses He has also been taking Janumet for a few years His A1c had been typically consistently high with this regimen  RECENT history:  INSULIN doses: TRESIBA 90 units twice a day, Novolog 25-30 before meals  He had been switched from Levemir to [facility] in 9/16 Also was started on Jardiance 10 mg daily and Janumet continued. He has had improved control with this and his A1c has progressively improved, now 6.6 His Drew Salinas was increased by 5 units in the evening on his last visit because of relatively high fasting readings, however he is taking 90 units in the morning also  Current blood sugar patterns and problems identified:  He has had fairly good fasting readings although has some variability  He does not feel hypoglycemic even though  has had a couple of readings in the 60s  He has not done any readings after supper and not clear if high readings in the mornings are carryover from the night before   His weight is about the same as in December  Has not done regular walking as directed  MEALTIME insulin: He is taking about the same dose based on his previous blood sugar with all meals.  However he says that if his fasting readings low normal he will not take any Novolog  No postprandial readings 2 hours after meals being checked  Periodically will eat breakfast without any protein  Mean values apply above for all meters except median for One Touch  PRE-MEAL Fasting Lunch Dinner Bedtime Overall  Glucose  range:  69-233   85-156   63-130     Mean/median: 99   97    107    Exercise: Trying to walk, less recently, no formal exercise He has not had a consultation with dietitian in several years Last CDE consultation: 06/2015  Wt Readings from Last 3 Encounters:  12/07/15 255 lb (115.667 kg)  11/13/15 269 lb (122.018 kg)  10/19/15 254 lb (115.214 kg)     Lab Results  Component Value Date   HGBA1C 7.4* 08/10/2015   HGBA1C 8.0 05/22/2015   HGBA1C 8.0 01/16/2015   Lab Results  Component Value Date   MICROALBUR 0.8 05/22/2015   LDLCALC 33 10/01/2015   CREATININE 0.99 10/08/2015    HYPOGONADISM: He was seen in consultation in 9/14 for evaluation of a low testosterone level. This was initially tested because of erectile dysfunction At that time his libido had been fairly good and also did not complain of any unusual fatigue, poor muscle endurance,  lack of motivation or depression Evaluation confirmed a low free testosterone level as well as normal prolactin. LH level was mildly increased  Because of documented hypogonadism he was started on Androderm 4 mg patch  However because of skin irritation with the patch and inconsistent adherence he was changed to AndroGel Apparently in 2015 this was changed to [place] by his PCP   Testosterone levels: Baseline 151, after starting AndroGel, 420 in 12/14  Recent history:  Subjectively with testosterone supplementation he previously had more energy level and mood was improved. With AndroGel he was using 3 pumps daily He is now using Axiron since his insurance does not cover AndroGel any more; he is using one pump under each arm Review of his application to need indicated that he is not using the application cup provided with his Axiron prescription  His medication will run down his side when he is trying to apply it with the pump directly His testosterone level is still not adequate but  has improved his symptoms modestly      Lab Results   Component Value Date   TESTOSTERONE 254* 11/26/2015        Medication List       This list is accurate as of: 12/07/15 11:59 PM.  Always use your most recent med list.               amLODipine 5 MG tablet  Commonly known as:  NORVASC  Take 1 tablet (5 mg total) by mouth daily.     atorvastatin 10 MG tablet  Commonly known as:  LIPITOR  Take 1 tablet (10 mg total) by mouth daily at 6 PM.     buPROPion 300 MG 24 hr tablet  Commonly known as:  WELLBUTRIN XL  Take 1 tablet (300 mg total) by mouth daily.     clopidogrel 75 MG tablet  Commonly known as:  PLAVIX  Take 1 tablet (75 mg total) by mouth daily.     clotrimazole 1 % cream  Commonly known as:  LOTRIMIN  Apply 1 application topically 2 (two) times daily. To feet x 2 wks     glucose blood test strip  Commonly known as:  ONE TOUCH ULTRA TEST  Use as instructed to check blood sugar 3 times per day dx code E11.65     insulin aspart 100 UNIT/ML injection  Commonly known as:  NOVOLOG  INJECT 30-40 UNITS INTO THE SKIN 3 TIMES A DAY WITH MEALS by sliding scale     Insulin Degludec 200 UNIT/ML Sopn  Commonly known as:  TRESIBA FLEXTOUCH  Inject 100 Units into the skin 2 (two) times daily.     Insulin Pen Needle 32G X 4 MM Misc  Use 3 per day to inject insulin     Insulin Syringe-Needle U-100 31G X 5/16" 1 ML Misc  Commonly known as:  B-D INS SYR ULTRAFINE 1CC/31G  Use as directed 4-6 times daily.     JANUMET 50-1000 MG tablet  Generic drug:  sitaGLIPtin-metformin  TAKE 1 TABLET BY MOUTH 2 (TWO) TIMES DAILY.     JARDIANCE 10 MG Tabs tablet  Generic drug:  empagliflozin  TAKE 1 TABLET BY MOUTH EVERY DAY     nitroGLYCERIN 0.2 mg/hr patch  Commonly known as:  NITRODUR - Dosed in mg/24 hr  1/4 patch daily     tadalafil 20 MG tablet  Commonly known as:  CIALIS  Take 1 tablet (20 mg total) by mouth daily as needed for erectile dysfunction.     Testosterone 30 MG/ACT Soln  Apply one pump under each arm daily       traZODone 100 MG tablet  Commonly known as:  DESYREL  TAKE 1 TABLET BY MOUTH AT BEDTIME     Vitamin D (Ergocalciferol) 50000 units Caps capsule  Commonly known as:  DRISDOL  Take 1 capsule (50,000 Units total) by mouth every 7 (seven) days.     zoster vaccine live (  PF) 19400 UNT/0.65ML injection  Commonly known as:  ZOSTAVAX  Inject 19,400 Units into the skin once.        Allergies:  Allergies  Allergen Reactions  . Lisinopril Cough    Past Medical History  Diagnosis Date  . Hypertension   . Hyperlipidemia   . Allergy   . Stroke (HCC) 1993  . Diabetes mellitus 1995  . GERD (gastroesophageal reflux disease)     Past Surgical History  Procedure Laterality Date  . Appendectomy      Family History  Problem Relation Age of Onset  . Diabetes Mother   . Diabetes Brother     Social History:  reports that he has been smoking Cigarettes.  He has a 22.5 pack-year smoking history. He has never used smokeless tobacco. He reports that he drinks about 1.2 oz of alcohol per week. He reports that he does not use illicit drugs.  REVIEW of systems:   Hypogonadism: See history of present illness  He has history of hyperlipidemia treated with Lipitor  Lab Results  Component Value Date   CHOL 107* 10/01/2015   HDL 46 10/01/2015   LDLCALC 33 10/01/2015   TRIG 141 10/01/2015   CHOLHDL 2.3 10/01/2015    History of hypertension treated with amlodipine only  ERECTILE dysfunction: Has been prescribed Cialis  Taking Plavix for history of stroke with no residual deficit  LABS:   Lab on 11/26/2015  Component Date Value Ref Range Status  . Testosterone 11/26/2015 254* 348 - 1197 ng/dL Final  . Comment, Testosterone 11/26/2015 Comment   Final   Comment: Adult male reference interval is based on a population of lean males up to 64 years old.   Lab on 10/08/2015  Component Date Value Ref Range Status  . Testosterone 10/08/2015 223.13* 300.00 - 890.00 ng/dL Final  .  Sodium 95/18/8416 140  135 - 145 mEq/L Final  . Potassium 10/08/2015 3.9  3.5 - 5.1 mEq/L Final  . Chloride 10/08/2015 104  96 - 112 mEq/L Final  . CO2 10/08/2015 28  19 - 32 mEq/L Final  . Glucose, Bld 10/08/2015 142* 70 - 99 mg/dL Final  . BUN 60/63/0160 10  6 - 23 mg/dL Final  . Creatinine, Ser 10/08/2015 0.99  0.40 - 1.50 mg/dL Final  . Total Bilirubin 10/08/2015 0.4  0.2 - 1.2 mg/dL Final  . Alkaline Phosphatase 10/08/2015 75  39 - 117 U/L Final  . AST 10/08/2015 27  0 - 37 U/L Final  . ALT 10/08/2015 27  0 - 53 U/L Final  . Total Protein 10/08/2015 7.0  6.0 - 8.3 g/dL Final  . Albumin 10/93/2355 3.9  3.5 - 5.2 g/dL Final  . Calcium 73/22/0254 9.1  8.4 - 10.5 mg/dL Final  . GFR 27/04/2375 80.91  >60.00 mL/min Final  . Fructosamine 10/08/2015 261  0 - 285 umol/L Final   Comment: Published reference interval for apparently healthy subjects between age 73 and 37 is 15 - 285 umol/L and in a poorly controlled diabetic population is 228 - 563 umol/L with a mean of 396 umol/L.      General Examination:   BP 138/64 mmHg  Pulse 83  Temp(Src) 98.3 F (36.8 C)  Resp 16  Ht  (1.803 m)  Wt 255 lb (115.667 kg)  BMI 35.58 kg/m2  SpO2 95%   Assessment/ Plan:  DIABETES type II with obesity:  See history of present illness for detailed discussion of his current management, blood sugar patterns and  problems identified  He has overall fairly good control of his diabetes with using Jardiance and also [place] Still requiring large doses of basal insulin A1c is further improved at 6.6 However not clear if he is having good control of his postprandial readings which he is not monitoring Also not consistent with her exercise Not able to lose weight much He does need to reduce his evening dose at least 5 units on the [place] to avoid overnight hypoglycemia Discussed how to titrate the dose to keep blood sugars and that required drainage May also need to reduce the morning  dose  Discussed checking blood sugars at least after supper to help him adjust the Novolog and to modify his diet based on blood sugars He was started walking regularly or go to the gym  HYPERTENSION: Blood pressure is fairly good   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 Sugar at 8-9pm             Counseling time on subjects discussed above is over 50% of today's 25 minute visit    [facility]  12/08/2015 4:01 PM  Note: This office note was prepared with Insurance underwriter. Any transcriptional errors that result from this process are unintentional.

## 2015-12-09 ENCOUNTER — Other Ambulatory Visit: Payer: Self-pay | Admitting: *Deleted

## 2015-12-09 DIAGNOSIS — E1165 Type 2 diabetes mellitus with hyperglycemia: Secondary | ICD-10-CM

## 2015-12-09 DIAGNOSIS — IMO0002 Reserved for concepts with insufficient information to code with codable children: Secondary | ICD-10-CM

## 2015-12-09 DIAGNOSIS — Z794 Long term (current) use of insulin: Principal | ICD-10-CM

## 2015-12-09 LAB — POCT GLYCOSYLATED HEMOGLOBIN (HGB A1C): HEMOGLOBIN A1C: 6.6

## 2015-12-19 ENCOUNTER — Other Ambulatory Visit: Payer: Self-pay | Admitting: Endocrinology

## 2015-12-22 ENCOUNTER — Other Ambulatory Visit: Payer: Self-pay | Admitting: Family Medicine

## 2015-12-24 ENCOUNTER — Other Ambulatory Visit: Payer: Self-pay | Admitting: Endocrinology

## 2015-12-30 ENCOUNTER — Other Ambulatory Visit: Payer: Self-pay | Admitting: Endocrinology

## 2016-01-06 ENCOUNTER — Other Ambulatory Visit: Payer: PRIVATE HEALTH INSURANCE

## 2016-01-11 ENCOUNTER — Ambulatory Visit: Payer: PRIVATE HEALTH INSURANCE | Admitting: Endocrinology

## 2016-01-25 ENCOUNTER — Other Ambulatory Visit: Payer: Self-pay | Admitting: Endocrinology

## 2016-02-02 ENCOUNTER — Telehealth: Payer: Self-pay

## 2016-02-02 ENCOUNTER — Ambulatory Visit
Admission: RE | Admit: 2016-02-02 | Discharge: 2016-02-02 | Disposition: A | Discharge status: Home / Self Care | Payer: PRIVATE HEALTH INSURANCE | Source: Ambulatory Visit | Attending: Family Medicine | Admitting: Family Medicine

## 2016-02-02 ENCOUNTER — Other Ambulatory Visit: Payer: Self-pay | Admitting: *Deleted

## 2016-02-02 ENCOUNTER — Other Ambulatory Visit: Payer: Self-pay | Admitting: Family Medicine

## 2016-02-02 DIAGNOSIS — M7500 Adhesive capsulitis of unspecified shoulder: Secondary | ICD-10-CM

## 2016-02-02 DIAGNOSIS — M25512 Pain in left shoulder: Secondary | ICD-10-CM

## 2016-02-02 DIAGNOSIS — E11618 Type 2 diabetes mellitus with other diabetic arthropathy: Secondary | ICD-10-CM

## 2016-02-02 NOTE — Telephone Encounter (Signed)
Ok to stay off Plavix for 5 days.

## 2016-02-02 NOTE — Telephone Encounter (Signed)
Noelle from [facility] is calling because the patient is taking Plavix and they needs for him to be off the medication for 5 days in order to do a arthrogram procedure. Please advise!  Please call Noelle! [phone]

## 2016-02-04 NOTE — Telephone Encounter (Signed)
LMOVM from [facility] Shaw, MD to [facility] - [facility] Oileroelle [phone]  Ext [phone]) Per message from Dr. Clelia CroftShaw - pt may stay off the Plavix for 5 days prior to arthrogram. Left clinic phone number to call back with any questions.

## 2016-02-04 NOTE — Telephone Encounter (Signed)
Yes, fine to hold for 5d prior to procedure. I think he is on it due to h/o CVA

## 2016-02-04 NOTE — Telephone Encounter (Signed)
Altame[facility] Oileroelle is calling back for Rose. She states that she they need a written consent from Dr. Clelia CroftShaw faxed stating that the patient okay be off Plavix for 5 days prior to the procedure. Please fax to [phone] Attn: Altamease OilerNoelle

## 2016-02-05 NOTE — Telephone Encounter (Signed)
Dr. Clelia CroftShaw, letter pended. Please print and sign for me. Thanks. Leave in my box.

## 2016-02-07 NOTE — Telephone Encounter (Signed)
Printed, signed, and placed in nurse box at main desk

## 2016-02-08 NOTE — Telephone Encounter (Signed)
[facility]

## 2016-02-11 ENCOUNTER — Telehealth: Payer: Self-pay | Admitting: Endocrinology

## 2016-02-11 NOTE — Telephone Encounter (Signed)
Patient need a new prescription for his medication Novalog [facility] to [id number] - [place], [place] - [address] [facility] [address] [phone] (Phone) [phone] (Fax)

## 2016-02-17 ENCOUNTER — Ambulatory Visit
Admission: RE | Admit: 2016-02-17 | Discharge: 2016-02-17 | Disposition: A | Discharge status: Home / Self Care | Payer: PRIVATE HEALTH INSURANCE | Source: Ambulatory Visit | Attending: Family Medicine | Admitting: Family Medicine

## 2016-02-17 DIAGNOSIS — M25512 Pain in left shoulder: Secondary | ICD-10-CM

## 2016-02-17 MED ORDER — IOHEXOL 180 MG/ML  SOLN
15.0000 mL | Freq: Once | INTRAMUSCULAR | Status: AC | PRN
  Administered 2016-02-17: 15 mL via INTRA_ARTICULAR

## 2016-02-18 ENCOUNTER — Telehealth: Payer: Self-pay

## 2016-02-18 ENCOUNTER — Other Ambulatory Visit: Payer: Self-pay

## 2016-02-18 ENCOUNTER — Other Ambulatory Visit: Payer: Self-pay | Admitting: *Deleted

## 2016-02-18 DIAGNOSIS — M25512 Pain in left shoulder: Secondary | ICD-10-CM

## 2016-02-18 MED ORDER — TRAMADOL HCL 50 MG PO TABS: 50.0000 mg | ORAL_TABLET | Freq: Two times a day (BID) | ORAL | Status: DC | PRN

## 2016-02-18 MED ORDER — TRAMADOL HCL 50 MG PO TABS: 50.0000 mg | ORAL_TABLET | Freq: Three times a day (TID) | ORAL | Status: DC | PRN

## 2016-02-18 MED ORDER — "INSULIN SYRINGE-NEEDLE U-100 31G X 5/16"" 1 ML MISC": Status: DC

## 2016-02-18 NOTE — Telephone Encounter (Signed)
[facility] I was waiting for his reply Please fax in tramadol.

## 2016-02-18 NOTE — Telephone Encounter (Signed)
[facility] [address]

## 2016-02-18 NOTE — Telephone Encounter (Signed)
Patient need a new prescription for your syringes 8 ml send to  [id number]- [place], [place] - [address] [phone] (Phone) [phone] (Fax)

## 2016-02-18 NOTE — Telephone Encounter (Signed)
Patient called and stated he saw his MRI results on mychart. Wanted to know if he was sending a referral to a surgery. Also says he is in a lot of pain and wanted to know if he could have some pain meds. Please advise or follow up.

## 2016-02-18 NOTE — Telephone Encounter (Signed)
Spoke with wife. Sent in referral for Dr. Ave Filterhandler and faxing tramadol.

## 2016-02-25 ENCOUNTER — Telehealth: Payer: Self-pay

## 2016-02-25 NOTE — Telephone Encounter (Signed)
Looks like message was sent to Dr. Clelia CroftShaw already

## 2016-02-25 NOTE — Telephone Encounter (Signed)
Patient dropped off a pre-operative clearance form at 104, he is requesting Dr Clelia CroftShaw to fill out. I made a copy of it and gave the copy to the patient and the original to CementonMichelle. Michelle at 104 to give to Dr Clelia CroftShaw today to fill out. Patient request the form to be faxed to [phone] when completed. Patients call back number is [phone]

## 2016-02-26 ENCOUNTER — Telehealth: Payer: Self-pay

## 2016-02-26 DIAGNOSIS — M25512 Pain in left shoulder: Secondary | ICD-10-CM

## 2016-02-26 NOTE — Telephone Encounter (Signed)
Patient left message on triage rq rf for Tramadol

## 2016-02-29 ENCOUNTER — Other Ambulatory Visit: Payer: PRIVATE HEALTH INSURANCE

## 2016-02-29 MED ORDER — TRAMADOL HCL 50 MG PO TABS: 50.0000 mg | ORAL_TABLET | Freq: Three times a day (TID) | ORAL | Status: DC | PRN

## 2016-02-29 NOTE — Telephone Encounter (Signed)
Needs to be seen

## 2016-02-29 NOTE — Telephone Encounter (Signed)
Pt contacted. He will call back to schedule appt. Pt stated that he has not been contacted to schedule his "procedure".

## 2016-02-29 NOTE — Telephone Encounter (Signed)
Spoke to pt, he stated he has seen dr whitfield & he is going to have surgery. He is in the process of getting papers signed by his PCP & endocrinologist to be released for surgery. Per dr Katrinka Blazingsmith okay to refill tramadol for 1 month.

## 2016-03-02 ENCOUNTER — Other Ambulatory Visit: Payer: Self-pay | Admitting: Endocrinology

## 2016-03-02 ENCOUNTER — Other Ambulatory Visit (INDEPENDENT_AMBULATORY_CARE_PROVIDER_SITE_OTHER): Payer: PRIVATE HEALTH INSURANCE

## 2016-03-02 DIAGNOSIS — Z794 Long term (current) use of insulin: Secondary | ICD-10-CM

## 2016-03-02 DIAGNOSIS — E1165 Type 2 diabetes mellitus with hyperglycemia: Secondary | ICD-10-CM | POA: Diagnosis not present

## 2016-03-02 DIAGNOSIS — E291 Testicular hypofunction: Secondary | ICD-10-CM

## 2016-03-02 LAB — COMPREHENSIVE METABOLIC PANEL
ALBUMIN: 4 g/dL (ref 3.5–5.2)
ALK PHOS: 65 U/L (ref 39–117)
ALT: 21 U/L (ref 0–53)
AST: 25 U/L (ref 0–37)
BILIRUBIN TOTAL: 0.7 mg/dL (ref 0.2–1.2)
BUN: 9 mg/dL (ref 6–23)
CO2: 28 mEq/L (ref 19–32)
Calcium: 9.6 mg/dL (ref 8.4–10.5)
Chloride: 105 mEq/L (ref 96–112)
Creatinine, Ser: 1.09 mg/dL (ref 0.40–1.50)
GFR: 72.31 mL/min (ref >60.00)
GLUCOSE: 82 mg/dL (ref 70–99)
Potassium: 4.2 mEq/L (ref 3.5–5.1)
SODIUM: 141 meq/L (ref 135–145)
TOTAL PROTEIN: 7 g/dL (ref 6.0–8.3)

## 2016-03-02 LAB — TESTOSTERONE: TESTOSTERONE: 190.29 ng/dL — AB (ref 300.00–890.00)

## 2016-03-02 LAB — HEMOGLOBIN A1C: HEMOGLOBIN A1C: 7.2 % — AB (ref 4.6–6.5)

## 2016-03-03 ENCOUNTER — Ambulatory Visit (INDEPENDENT_AMBULATORY_CARE_PROVIDER_SITE_OTHER): Payer: PRIVATE HEALTH INSURANCE | Admitting: Endocrinology

## 2016-03-03 ENCOUNTER — Encounter: Payer: Self-pay | Admitting: Endocrinology

## 2016-03-03 VITALS — BP 120/60 | HR 77 | Temp 98.8°F | Ht 71.0 in

## 2016-03-03 DIAGNOSIS — Z794 Long term (current) use of insulin: Secondary | ICD-10-CM | POA: Diagnosis not present

## 2016-03-03 DIAGNOSIS — E1165 Type 2 diabetes mellitus with hyperglycemia: Secondary | ICD-10-CM

## 2016-03-03 DIAGNOSIS — E291 Testicular hypofunction: Secondary | ICD-10-CM | POA: Diagnosis not present

## 2016-03-03 MED ORDER — CLOMIPHENE CITRATE 50 MG PO TABS: ORAL_TABLET | ORAL | Status: DC

## 2016-03-03 NOTE — Progress Notes (Signed)
Patient ID: Drew Salinas, male   DOB: 10/20/1951, 65 y.o.   MRN: [id number]   Chief complaint:  followup of diabetes and low testosterone  History of Present Illness:  DIABETES:  He was told to have diabetes several years ago and has been on insulin since about 2006 Previously in [place] he was taking large doses of insulin with Levemir 100 units twice a day along with Novolog 30-36 units before meals.  He prefers to take Levemir with a syringe because of needing large doses He has also been taking Janumet for a few years His A1c had been typically consistently high with this regimen  RECENT history:  INSULIN doses: TRESIBA 90--85 units day, Novolog 25-30 before meals  He had [facility] Also was started on Jardiance 10 mg daily and Janumet continued. He has had improved control with this and although his A1c was 6.6 it is gone up to 7.2 His [place] was reduced by 5 units in the evening on the last visit because of low-normal fasting readings  Current blood sugar patterns and problems identified:  He has had fairly good fasting readings although has some variability as before  He does not check his readings after supper despite reminders  No hypoglycemia   His weight is about the same as in February  Has not done regular walking as directed  MEALTIME insulin: He is taking 25 or 30 units based on his blood sugar level even though he was told to adjusted based on his meal size   Mean values apply above for all meters except median for One Touch  PRE-MEAL Fasting Lunch Dinner Bedtime Overall  Glucose range: 77-231   80-162     Mean/median: 115  144  121   124   Exercise:   less recently, no formal exercise He has not had a consultation with dietitian in several years Last CDE consultation: 06/2015  Wt Readings from Last 3 Encounters:  02/17/16 255 lb (115.667 kg)  12/07/15 255 lb (115.667 kg)  11/13/15 269 lb (122.018 kg)      Lab Results  Component Value Date   HGBA1C 7.2* 03/02/2016   HGBA1C 6.6 12/07/2015   HGBA1C 7.4* 08/10/2015   Lab Results  Component Value Date   MICROALBUR 0.8 05/22/2015   LDLCALC 33 10/01/2015   CREATININE 1.09 03/02/2016    HYPOGONADISM: He was seen in consultation in 9/14 for evaluation of a low testosterone level. This was initially tested because of erectile dysfunction At that time his libido had been fairly good and also did not complain of any unusual fatigue, poor muscle endurance,  lack of motivation or depression Evaluation confirmed a low free testosterone level as well as normal prolactin. LH level was mildly increased  Because of documented hypogonadism he was started on Androderm 4 mg patch  However because of skin irritation with the patch and inconsistent adherence he was changed to AndroGel Apparently in 2015 this was changed to [place] by his PCP   Testosterone levels: Baseline 151, after starting AndroGel, 420 in 12/14  Recent history: Subjectively with testosterone supplementation he previously had more energy level and mood was improved. With AndroGel he was using 3 pumps daily  He is now using Axiron since his insurance does not cover AndroGel On his last visit he was not using the right technique for applying this and this was shown. However his testosterone level is still very low He does feel some fatigue  but no complaints of libido decrease     Lab Results  Component Value Date   TESTOSTERONE 190.29* 03/02/2016        Medication List       This list is accurate as of: 03/03/16  9:03 AM.  Always use your most recent med list.               amLODipine 5 MG tablet  Commonly known as:  NORVASC  Take 1 tablet (5 mg total) by mouth daily.     atorvastatin 10 MG tablet  Commonly known as:  LIPITOR  Take 1 tablet (10 mg total) by mouth daily at 6 PM.     AXIRON 30 MG/ACT Soln  Generic drug:  Testosterone  APPLY 1 PUMP UNDER  EACH ARM DAILY     buPROPion 300 MG 24 hr tablet  Commonly known as:  WELLBUTRIN XL  Take 1 tablet (300 mg total) by mouth daily.     clomiPHENE 50 MG tablet  Commonly known as:  CLOMID  1/2 qd     clopidogrel 75 MG tablet  Commonly known as:  PLAVIX  Take 1 tablet (75 mg total) by mouth daily.     clotrimazole 1 % cream  Commonly known as:  LOTRIMIN  Apply 1 application topically 2 (two) times daily. To feet x 2 wks     glucose blood test strip  Commonly known as:  ONE TOUCH ULTRA TEST  Use as instructed to check blood sugar 3 times per day dx code E11.65     Insulin Pen Needle 32G X 4 MM Misc  Use 3 per day to inject insulin     Insulin Syringe-Needle U-100 31G X 5/16" 1 ML Misc  Commonly known as:  B-D INS SYR ULTRAFINE 1CC/31G  Use as directed 4-6 times daily.     JANUMET 50-1000 MG tablet  Generic drug:  sitaGLIPtin-metformin  TAKE 1 TABLET TWICE A DAY     JARDIANCE 10 MG Tabs tablet  Generic drug:  empagliflozin  TAKE 1 TABLET BY MOUTH EVERY DAY     nitroGLYCERIN 0.2 mg/hr patch  Commonly known as:  NITRODUR - Dosed in mg/24 hr  1/4 patch daily     NOVOLOG 100 UNIT/ML injection  Generic drug:  insulin aspart  INJECT 30-40 UNITS INTO THE SKIN 3 TIMES A DAY WITH MEALS BY SLIDING SCALE     tadalafil 20 MG tablet  Commonly known as:  CIALIS  Take 1 tablet (20 mg total) by mouth daily as needed for erectile dysfunction.     traMADol 50 MG tablet  Commonly known as:  ULTRAM  Take 1 tablet (50 mg total) by mouth every 12 (twelve) hours as needed.     traMADol 50 MG tablet  Commonly known as:  ULTRAM  Take 1 tablet (50 mg total) by mouth every 8 (eight) hours as needed.     traZODone 100 MG tablet  Commonly known as:  DESYREL  TAKE 1 TABLET BY MOUTH AT BEDTIME     TRESIBA FLEXTOUCH 200 UNIT/ML Sopn  Generic drug:  Insulin Degludec  INJECT 100 UNITS INTO THE SKIN 2 (TWO) TIMES DAILY.     Vitamin D (Ergocalciferol) 50000 units Caps capsule  Commonly known  as:  DRISDOL  Take 1 capsule (50,000 Units total) by mouth every 7 (seven) days.     zoster vaccine live (PF) 19400 UNT/0.65ML injection  Commonly known as:  ZOSTAVAX  Inject 19,400 Units into the skin  once.        Allergies:  Allergies  Allergen Reactions  . Lisinopril Cough    Past Medical History  Diagnosis Date  . Hypertension   . Hyperlipidemia   . Allergy   . Stroke (HCC) 1993  . Diabetes mellitus 1995  . GERD (gastroesophageal reflux disease)     Past Surgical History  Procedure Laterality Date  . Appendectomy      Family History  Problem Relation Age of Onset  . Diabetes Mother   . Diabetes Brother     Social History:  reports that he has been smoking Cigarettes.  He has a 22.5 pack-year smoking history. He has never used smokeless tobacco. He reports that he drinks about 1.2 oz of alcohol per week. He reports that he does not use illicit drugs.  REVIEW of systems:   He is going to get rotator cuff surgery  He has history of hyperlipidemia treated with Lipitor  Lab Results  Component Value Date   CHOL 107* 10/01/2015   HDL 46 10/01/2015   LDLCALC 33 10/01/2015   TRIG 141 10/01/2015   CHOLHDL 2.3 10/01/2015    History of hypertension treated with amlodipine only  ERECTILE dysfunction: Has been prescribed CialisBut does not use it  Taking Plavix for history of stroke with no residual deficit  LABS:   Lab on 03/02/2016  Component Date Value Ref Range Status  . Hgb A1c MFr Bld 03/02/2016 7.2* 4.6 - 6.5 % Final   Glycemic Control Guidelines for People with Diabetes:Non Diabetic:  <6%Goal of Therapy: <7%Additional Action Suggested:  >8%   . Sodium 03/02/2016 141  135 - 145 mEq/L Final  . Potassium 03/02/2016 4.2  3.5 - 5.1 mEq/L Final  . Chloride 03/02/2016 105  96 - 112 mEq/L Final  . CO2 03/02/2016 28  19 - 32 mEq/L Final  . Glucose, Bld 03/02/2016 82  70 - 99 mg/dL Final  . BUN 56/21/3086 9  6 - 23 mg/dL Final  . Creatinine, Ser 03/02/2016  1.09  0.40 - 1.50 mg/dL Final  . Total Bilirubin 03/02/2016 0.7  0.2 - 1.2 mg/dL Final  . Alkaline Phosphatase 03/02/2016 65  39 - 117 U/L Final  . AST 03/02/2016 25  0 - 37 U/L Final  . ALT 03/02/2016 21  0 - 53 U/L Final  . Total Protein 03/02/2016 7.0  6.0 - 8.3 g/dL Final  . Albumin 57/84/6962 4.0  3.5 - 5.2 g/dL Final  . Calcium 95/28/4132 9.6  8.4 - 10.5 mg/dL Final  . GFR 44/10/270 72.31  >60.00 mL/min Final  . Testosterone 03/02/2016 190.29* 300.00 - 890.00 ng/dL Final     General Examination:   BP 120/60 mmHg  Pulse 77  Temp(Src) 98.8 F (37.1 C) (Oral)  Ht  (1.803 m)  SpO2 93%   Assessment/ Plan:  DIABETES type II with obesity:  See history of present illness for detailed discussion of his current management, blood sugar patterns and problems identified  He has overall fairly good control of his diabetes Although A1c is relatively higher at 7.2 Still requiring large doses of basal insulin along with 10 mg Jardiance and Janumet However not clear if he is having good control of his postprandial readings after evening meal which he is not monitoring Has not lost any weight since his last visit Still not motivated to exercise as discussed on each visit Again discussed need to adjust her mealtime doses based on his meal size and response with postprandial  monitoring especially at suppertime  Also gave him the option of taking [place] and 1 injection using 175 units in the mornings and adjusting based on fasting reading He will call if he has consistently high readings after evening meal Also consider increasing dose of Jardiance  HYPERTENSION: Blood pressure is fairly good with only Norvasc   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,  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 He has been somewhat symptomatic with low levels of testosterone  Although he had adequate levels with AndroGel he is not getting adequate levels  with Axiron Given him the option of trying clomiphene and discussed how this works  He will try half tablet every other day Follow-up in 6 weeks for review of current problems and adjustment of medications   Patient Instructions  More sugars after dinner   Walk daily  Try 175 Tresiba in am  Check blood sugars on waking up 3-4  times a week Also check blood sugars about 2 hours after a meal and do this after different meals by rotation  Recommended blood sugar levels on waking up is 90-130 and about 2 hours after meal is 130-160  Please bring your blood sugar monitor to each visit, thank you      Counseling time on subjects discussed above is over 50% of today's 25 minute visit    [facility]  03/03/2016 9:03 AM  Note: This office note was prepared with Dragon voice recognition system technology. Any transcriptional errors that result from this process are unintentional.

## 2016-03-03 NOTE — Patient Instructions (Addendum)
More sugars after dinner   Walk daily  Try 175 Tresiba in am  Check blood sugars on waking up 3-4  times a week Also check blood sugars about 2 hours after a meal and do this after different meals by rotation  Recommended blood sugar levels on waking up is 90-130 and about 2 hours after meal is 130-160  Please bring your blood sugar monitor to each visit, thank you

## 2016-03-06 ENCOUNTER — Other Ambulatory Visit: Payer: Self-pay | Admitting: Endocrinology

## 2016-03-24 ENCOUNTER — Telehealth: Payer: Self-pay

## 2016-03-24 NOTE — Telephone Encounter (Signed)
Pt is checking on status of clearance form that they dropped off last week

## 2016-03-31 ENCOUNTER — Ambulatory Visit (INDEPENDENT_AMBULATORY_CARE_PROVIDER_SITE_OTHER): Payer: PRIVATE HEALTH INSURANCE | Admitting: Family Medicine

## 2016-03-31 ENCOUNTER — Encounter: Payer: Self-pay | Admitting: Family Medicine

## 2016-03-31 VITALS — BP 130/64 | HR 75 | Temp 98.6°F | Resp 16 | Ht 71.0 in | Wt 254.0 lb

## 2016-03-31 DIAGNOSIS — E785 Hyperlipidemia, unspecified: Secondary | ICD-10-CM | POA: Diagnosis not present

## 2016-03-31 DIAGNOSIS — Z720 Tobacco use: Secondary | ICD-10-CM | POA: Diagnosis not present

## 2016-03-31 DIAGNOSIS — Z8673 Personal history of transient ischemic attack (TIA), and cerebral infarction without residual deficits: Secondary | ICD-10-CM

## 2016-03-31 DIAGNOSIS — Z794 Long term (current) use of insulin: Secondary | ICD-10-CM

## 2016-03-31 DIAGNOSIS — E1165 Type 2 diabetes mellitus with hyperglycemia: Secondary | ICD-10-CM | POA: Diagnosis not present

## 2016-03-31 DIAGNOSIS — IMO0002 Reserved for concepts with insufficient information to code with codable children: Secondary | ICD-10-CM

## 2016-03-31 DIAGNOSIS — I10 Essential (primary) hypertension: Secondary | ICD-10-CM | POA: Diagnosis not present

## 2016-03-31 DIAGNOSIS — IMO0001 Reserved for inherently not codable concepts without codable children: Secondary | ICD-10-CM

## 2016-03-31 NOTE — Patient Instructions (Addendum)
[place] is now offering annual lung cancer screening by low-dose CT scan.  This is covered for qualifying patients and your insurance will be checked before the procedure.  Call the lung cancer screening nurse navigators at [phone] to learn more about this and get scheduled.     IF you received an x-ray today, you will receive an invoice from [facility]. Please contact [facility] at [phone] with questions or concerns regarding your invoice.   IF you received labwork today, you will receive an invoice from [facility]. Please contact [facility] at [phone] with questions or concerns regarding your invoice.   Our billing staff will not be able to assist you with questions regarding bills from these companies.  You will be contacted with the lab results as soon as they are available. The fastest way to get your results is to activate your My Chart account. Instructions are located on the last page of this paperwork. If you have not heard from us regarding the results in 2 weeks, please contact this office.    Outpatient Surgery Guidelines, Adult Outpatient procedures are those for which the person having the procedure is allowed to go home the same day as the procedure. Various procedures are done on an outpatient basis. You should follow some general guidelines if you will be having an outpatient procedure. LET [facility] KNOW ABOUT:  Any allergies you have.  All medicines you are taking, including vitamins, herbs, eye drops, creams, and over-the-counter medicines.  Previous problems you or members of your family have had with the use of anesthetics.  Any blood disorders you have.  Previous surgeries you have had.  Medical conditions you have. RISKS AND COMPLICATIONS Your health care provider will discuss possible risks and complications with you before surgery. Common risks and complications include:    Problems due to the use of anesthetics.  Blood loss and replacement (does not apply to minor surgical procedures).  Temporary increase in pain due to surgery.  Uncorrected pain or problems that the surgery was meant to correct.  Infection.  New damage. BEFORE THE PROCEDURE  Ask your health care provider about changing or stopping your regular medicines. You may need to stop taking certain medicines in the days or weeks before the procedure.  Stop smoking at least 2 weeks before surgery. This lowers your risk for complications during and after surgery. Ask your health care provider for help with this if needed.  Eat your usual meals and a light supper the day before surgery. Continue fluid intake. Do not drink alcohol.  Do not eat or drink after midnight the night before your surgery. Take your usual medicine the morning of surgery with a sip of water unless instructed otherwise. Check with your health care provider if you are unsure. This is particularly important if you take diabetes medicine.  Arrange for someone to take you home and to stay with you for 24 hours after the procedure. Medicine given for your procedure may affect your ability to drive or to care for yourself.  Call your health care provider's office if you develop an illness or problem that may prevent you from safely having your procedure. AFTER THE PROCEDURE After surgery, you will be taken to a recovery area, where your progress will be monitored. If there are no complications, you will be allowed to go home when you are awake, stable, and taking fluids well. You may have numbness around the surgical site. Healing will  take some time. You will have tenderness at the surgical site and may have some swelling and bruising. You may also have some nausea. HOME CARE INSTRUCTIONS  Do not drive for 24 hours, or as directed by your health care provider. Do not drive while taking prescription pain medicines.  Do not drink  alcohol for 24 hours.  Do not make important decisions or sign legal documents for 24 hours.  You may resume a normal diet and activities as directed.  Do not lift anything heavier than 10 pounds (4.5 kg) or play contact sports until your health care provider says it is okay.  Change your bandages (dressings) as directed.  Only take over-the-counter or prescription medicines as directed by your health care provider.  Follow up with your health care provider as directed. SEEK MEDICAL CARE IF:  You have increased bleeding (more than a small spot) from the surgical site.  You have redness, swelling, or increasing pain in the wound.  You see pus coming from the wound.  You have a fever.  You notice a bad smell coming from the wound or dressing.  You feel lightheaded or faint.  You develop a rash.  You have trouble breathing.  You develop allergies. MAKE SURE YOU:  Understand these instructions.  Will watch your condition.  Will get help right away if you are not doing well or get worse.   This information is not intended to replace advice given to you by your health care provider. Make sure you discuss any questions you have with your health care provider.   Document Released: 07/12/2001 Document Revised: 03/03/2015 Document Reviewed: 03/21/2013 Elsevier Interactive Patient Education [employer]

## 2016-03-31 NOTE — Progress Notes (Signed)
Subjective:    Patient ID: Drew Salinas, male    DOB: [birth year], 65 y.o.   MRN: [id number] Chief Complaint  Patient presents with  . surgical clearance  . Hypertension    HPI  Has upcoming surgery sched and needs medical clearance from myself today. Exercise capacity - flight of stairs, walk up a hill, walk @ 4 miles/hr, heavy house work OTC meds, nsaids no illicit drug use. No personal or family history of anestheic complications  Insulin dependent DM and hypogonadism: followed by Dr. Lucianne Muss HTN: good control with Norvasc   Not getting any exercise. Can do 2 flights of stairs or walk 4 blocks without taking a break.   No chest tightness or pressure. No SHoB or DOE. Overall sleeping well, resting well. Does have a h/o OSA and is using the CPAP machine every night. Drinks a small amount of EtoH - about 2 drinks a week, limited to the weekend. Takes a mvi, asa , no reg nsaids  + Tob use ongoing (wife still smoking as well)- about 1/3 ppd.  Since 65 yo.  At 65 yo he had a CVA, treated medically and is on Plavix. Developed tingling on his left side. Did have a stress test done prior but things it has been over 5 years  Past Medical History  Diagnosis Date  . Hypertension   . Hyperlipidemia   . Allergy   . Stroke (HCC) 1993  . Diabetes mellitus 1995  . GERD (gastroesophageal reflux disease)    Past Surgical History  Procedure Laterality Date  . Appendectomy     Current Outpatient Prescriptions on File Prior to Visit  Medication Sig Dispense Refill  . amLODipine (NORVASC) 5 MG tablet Take 1 tablet (5 mg total) by mouth daily. 90 tablet 3  . atorvastatin (LIPITOR) 10 MG tablet Take 1 tablet (10 mg total) by mouth daily at 6 PM. 90 tablet 3  . AXIRON 30 MG/ACT SOLN APPLY 1 PUMP UNDER EACH ARM DAILY (Patient not taking: Reported on 04/14/2016) 90 mL 3  . buPROPion (WELLBUTRIN XL) 300 MG 24 hr tablet Take 1 tablet (300 mg total) by mouth daily. 90 tablet 3  . clopidogrel  (PLAVIX) 75 MG tablet Take 1 tablet (75 mg total) by mouth daily. 90 tablet 3  . clotrimazole (LOTRIMIN) 1 % cream Apply 1 application topically 2 (two) times daily. To feet x 2 wks (Patient not taking: Reported on 04/14/2016) 113 g 2  . Insulin Pen Needle 32G X 4 MM MISC Use 3 per day to inject insulin 100 each 3  . Insulin Syringe-Needle U-100 (B-D INS SYR ULTRAFINE 1CC/31G) 31G X 5/16" 1 ML MISC Use as directed 4-6 times daily. 600 each 5  . JANUMET 50-1000 MG tablet TAKE 1 TABLET TWICE A DAY 60 tablet 2  . JARDIANCE 10 MG TABS tablet TAKE 1 TABLET BY MOUTH EVERY DAY 30 tablet 3  . nitroGLYCERIN (NITRODUR - DOSED IN MG/24 HR) 0.2 mg/hr patch 1/4 patch daily (Patient not taking: Reported on 04/14/2016) 30 patch 1  . NOVOLOG 100 UNIT/ML injection INJECT 30-40 UNITS INTO THE SKIN 3 TIMES A DAY WITH MEALS BY SLIDING SCALE (Patient taking differently: INJECT 20-30 UNITS INTO THE SKIN 3 TIMES A DAY WITH MEALS BY SLIDING SCALE) 30 mL 3  . tadalafil (CIALIS) 20 MG tablet Take 1 tablet (20 mg total) by mouth daily as needed for erectile dysfunction. (Patient not taking: Reported on 04/14/2016) 10 tablet 3  . traMADol (ULTRAM) 50 MG  tablet Take 1 tablet (50 mg total) by mouth every 12 (twelve) hours as needed. (Patient not taking: Reported on 04/14/2016) 60 tablet 0  . traMADol (ULTRAM) 50 MG tablet Take 1 tablet (50 mg total) by mouth every 8 (eight) hours as needed. 30 tablet 0  . traZODone (DESYREL) 100 MG tablet TAKE 1 TABLET BY MOUTH AT BEDTIME 90 tablet 3  . TRESIBA FLEXTOUCH 200 UNIT/ML SOPN INJECT 100 UNITS INTO THE SKIN 2 (TWO) TIMES DAILY. (Patient taking differently: INJECT 86 UNITS INTO THE SKIN 2 (TWO) TIMES DAILY.) 27 mL 2  . Vitamin D, Ergocalciferol, (DRISDOL) 50000 UNITS CAPS capsule Take 1 capsule (50,000 Units total) by mouth every 7 (seven) days. (Patient not taking: Reported on 04/14/2016) 8 capsule 0   No current facility-administered medications on file prior to visit.   Allergies    Allergen Reactions  . Lisinopril Cough   Family History  Problem Relation Age of Onset  . Diabetes Mother   . Diabetes Brother    Social History   Social History  . Marital Status: Married    Spouse Name: N/A  . Number of Children: 3  . 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 Smoking status: Current Some Day Smoker -- 0.50 packs/day for 45 years    Types: Cigarettes    Last Attempt to Quit: 07/05/2013  . Smokeless tobacco: Never Used     Comment: quit on 07/05/2013  . Alcohol Use: 1.2 oz/week    2 Cans of beer per week  . Drug Use: No  . Sexual Activity: Yes   Other Topics Concern  . None   Social History Narrative    Review of Systems See HPI    Objective:  BP 130/64 mmHg  Pulse 75  Temp(Src) 98.6 F (37 C) (Oral)  Resp 16  Ht 5\' 11"  (1.803 m)  Wt 254 lb (115.214 kg)  BMI 35.44 kg/m2  Physical Exam  Constitutional: He is oriented to person, place, and time. He appears well-developed and well-nourished. No distress.  HENT:  Head: Normocephalic and atraumatic.  Eyes: Conjunctivae are normal. Pupils are equal, round, and reactive to light. No scleral icterus.  Neck: Normal range of motion. Neck supple. No thyromegaly present.  Cardiovascular: Normal rate, regular rhythm, normal heart sounds and intact distal pulses.   Pulmonary/Chest: Effort normal and breath sounds normal. No respiratory distress.  Musculoskeletal: He exhibits no edema.  Lymphadenopathy:    He has no cervical adenopathy.  Neurological: He is alert and oriented to person, place, and time.  Skin: Skin is warm and dry. He is not diaphoretic.  Psychiatric: He has a normal mood and affect. His behavior is normal.       Assessment & Plan:  Hold nsaids Has appt with Dr. Lucianne MussKumar 6/15 and optho Dr. Ashley RoyaltyMatthews 11/13. Last month pt's a1c was 7.2 but noml cmp Cbc was normal 6 mos prior + H/o CVA with no medical deficits with insulin  dependent diabetes does place pt at increased risk - 6 mos prior cholesterol was wonderful; EKG 6 mos prior was good  1. Essential hypertension, benign   2. Hyperlipidemia   3. Insulin dependent type 2 diabetes mellitus, uncontrolled (HCC)   4. History of stroke without residual deficits   5. Obesity, Class II, BMI 35-39.9, with comorbidity (HCC)   6. Tobacco user     Norberto SorensonEva Noam Franzen, M.D.  [facility]  [facility] [address] [place], [place] [address] [phone] phone [phone] fax  04/27/2016 9:12 PM

## 2016-04-08 ENCOUNTER — Other Ambulatory Visit: Payer: PRIVATE HEALTH INSURANCE

## 2016-04-08 ENCOUNTER — Encounter: Payer: Self-pay | Admitting: Endocrinology

## 2016-04-08 NOTE — Telephone Encounter (Signed)
Pt was seen in [facility] and form completed at that time.

## 2016-04-11 ENCOUNTER — Other Ambulatory Visit (INDEPENDENT_AMBULATORY_CARE_PROVIDER_SITE_OTHER): Payer: PRIVATE HEALTH INSURANCE

## 2016-04-11 DIAGNOSIS — Z794 Long term (current) use of insulin: Secondary | ICD-10-CM | POA: Diagnosis not present

## 2016-04-11 DIAGNOSIS — E291 Testicular hypofunction: Secondary | ICD-10-CM

## 2016-04-11 DIAGNOSIS — E1165 Type 2 diabetes mellitus with hyperglycemia: Secondary | ICD-10-CM | POA: Diagnosis not present

## 2016-04-11 LAB — BASIC METABOLIC PANEL
BUN: 18 mg/dL (ref 6–23)
CO2: 26 mEq/L (ref 19–32)
CREATININE: 1.12 mg/dL (ref 0.40–1.50)
Calcium: 9.3 mg/dL (ref 8.4–10.5)
Chloride: 102 mEq/L (ref 96–112)
GFR: 70.06 mL/min (ref >60.00)
Glucose, Bld: 51 mg/dL — ABNORMAL LOW (ref 70–99)
Potassium: 4 mEq/L (ref 3.5–5.1)
Sodium: 139 mEq/L (ref 135–145)

## 2016-04-11 LAB — TESTOSTERONE: TESTOSTERONE: 190.36 ng/dL — AB (ref 300.00–890.00)

## 2016-04-11 LAB — LUTEINIZING HORMONE: LH: 14.39 m[IU]/mL — ABNORMAL HIGH (ref 1.50–9.30)

## 2016-04-12 LAB — FRUCTOSAMINE: Fructosamine: 264 umol/L (ref 0–285)

## 2016-04-14 ENCOUNTER — Ambulatory Visit (INDEPENDENT_AMBULATORY_CARE_PROVIDER_SITE_OTHER): Payer: PRIVATE HEALTH INSURANCE | Admitting: Endocrinology

## 2016-04-14 ENCOUNTER — Encounter: Payer: Self-pay | Admitting: Endocrinology

## 2016-04-14 VITALS — BP 124/72 | HR 77 | Wt 257.0 lb

## 2016-04-14 DIAGNOSIS — Z794 Long term (current) use of insulin: Secondary | ICD-10-CM | POA: Diagnosis not present

## 2016-04-14 DIAGNOSIS — E1165 Type 2 diabetes mellitus with hyperglycemia: Secondary | ICD-10-CM | POA: Diagnosis not present

## 2016-04-14 DIAGNOSIS — E291 Testicular hypofunction: Secondary | ICD-10-CM

## 2016-04-14 MED ORDER — CLOMIPHENE CITRATE 50 MG PO TABS: ORAL_TABLET | ORAL | Status: DC

## 2016-04-14 NOTE — Patient Instructions (Signed)
Skip Tresiba tonite then 160 in am daily More sugars after supper

## 2016-04-14 NOTE — Progress Notes (Signed)
Patient ID: Drew Salinas, male   DOB: September 16, 1951, 65 y.o.   MRN: [id number]   Chief complaint:  followup of  low testosterone  History of Present Illness:  DIABETES:  He was told to have diabetes several years ago and has been on insulin since about 2006 Previously in [place] he was taking large doses of insulin with Levemir 100 units twice a day along with Novolog 30-36 units before meals.  He prefers to take Levemir with a syringe because of needing large doses He has also been taking Janumet for a few years His A1c had been typically consistently high with this regimen  RECENT history:  The following is a copy of his previous note:  INSULIN doses: TRESIBA 88 bid units day, Novolog 25-30 before meals  He had been switched from Levemir to [facility] in 9/16 Also was started on Jardiance 10 mg daily and Janumet continued. He has had improved control with this and although his A1c was 6.6 it is gone up to 7.2 His [place] was reduced by 5 units in the evening on the last visit because of low-normal fasting readings  Current blood sugar patterns and problems identified:  He has had fairly good fasting readings although has some variability as before  He does not check his readings after supper despite reminders  No hypoglycemia   His weight is about the same as in February  Has not done regular walking as directed  MEALTIME insulin: He is taking 25 or 30 units based on his blood sugar level even though he was told to adjusted based on his meal size   Mean values apply above for all meters except median for One Touch  PRE-MEAL Fasting Lunch Dinner Bedtime Overall  Glucose range: 77-231   80-162     Mean/median: 115  144  121   124   Exercise:   less recently, no formal exercise He has not had a consultation with dietitian in several years Last CDE consultation: 06/2015  Wt Readings from Last 3 Encounters:  04/14/16 257 lb (116.574 kg)  03/31/16 254 lb (115.214 kg)    02/17/16 255 lb (115.667 kg)     Lab Results  Component Value Date   HGBA1C 7.2* 03/02/2016   HGBA1C 6.6 12/07/2015   HGBA1C 7.4* 08/10/2015   Lab Results  Component Value Date   MICROALBUR 0.8 05/22/2015   LDLCALC 33 10/01/2015   CREATININE 1.12 04/11/2016    HYPOGONADISM: He was seen in consultation in 9/14 for evaluation of a low testosterone level. This was initially tested because of erectile dysfunction At that time his libido had been fairly good and also did not complain of any unusual fatigue, poor muscle endurance,  lack of motivation or depression Evaluation confirmed a low free testosterone level as well as normal prolactin. LH level was mildly increased  Because of documented hypogonadism he was started on Androderm 4 mg patch  However because of skin irritation with the patch and inconsistent adherence he was changed to AndroGel Apparently in 2015 this was changed to [place] by his PCP   Testosterone levels: Baseline 151, after starting AndroGel, 420 in 12/14  Recent history: Subjectively with testosterone supplementation he previously had more energy level and mood was improved. With AndroGel he was using 3 pumps daily but this is not covered by insurance  With using Axiron his testosterone levels were consistently low even with using the correct application technique He is taking clomiphene as a trial and is  taking half tablet daily. He thinks that with taking this his energy level was improving significantly  However he ran out of the clomiphene a week before his lab was done and was not able to get the refill from the pharmacy [facility] since his insurance does not cover AndroGel However his testosterone level is still  low, LH is relatively high     Lab Results  Component Value Date   TESTOSTERONE 190.36* 04/11/2016        Medication List       This list is accurate as of: 04/14/16 11:59 PM.  Always use your most recent med list.                amLODipine 5 MG tablet  Commonly known as:  NORVASC  Take 1 tablet (5 mg total) by mouth daily.     atorvastatin 10 MG tablet  Commonly known as:  LIPITOR  Take 1 tablet (10 mg total) by mouth daily at 6 PM.     AXIRON 30 MG/ACT Soln  Generic drug:  Testosterone  APPLY 1 PUMP UNDER EACH ARM DAILY     buPROPion 300 MG 24 hr tablet  Commonly known as:  WELLBUTRIN XL  Take 1 tablet (300 mg total) by mouth daily.     clomiPHENE 50 MG tablet  Commonly known as:  CLOMID  1/2 qd     clopidogrel 75 MG tablet  Commonly known as:  PLAVIX  Take 1 tablet (75 mg total) by mouth daily.     clotrimazole 1 % cream  Commonly known as:  LOTRIMIN  Apply 1 application topically 2 (two) times daily. To feet x 2 wks     glucose blood test strip  Commonly known as:  ONE TOUCH ULTRA TEST  Use as instructed to check blood sugar 3 times per day dx code E11.65     Insulin Pen Needle 32G X 4 MM Misc  Use 3 per day to inject insulin     Insulin Syringe-Needle U-100 31G X 5/16" 1 ML Misc  Commonly known as:  B-D INS SYR ULTRAFINE 1CC/31G  Use as directed 4-6 times daily.     JANUMET 50-1000 MG tablet  Generic drug:  sitaGLIPtin-metformin  TAKE 1 TABLET TWICE A DAY     JARDIANCE 10 MG Tabs tablet  Generic drug:  empagliflozin  TAKE 1 TABLET BY MOUTH EVERY DAY     nitroGLYCERIN 0.2 mg/hr patch  Commonly known as:  NITRODUR - Dosed in mg/24 hr  1/4 patch daily     NOVOLOG 100 UNIT/ML injection  Generic drug:  insulin aspart  INJECT 30-40 UNITS INTO THE SKIN 3 TIMES A DAY WITH MEALS BY SLIDING SCALE     tadalafil 20 MG tablet  Commonly known as:  CIALIS  Take 1 tablet (20 mg total) by mouth daily as needed for erectile dysfunction.     traMADol 50 MG tablet  Commonly known as:  ULTRAM  Take 1 tablet (50 mg total) by mouth every 12 (twelve) hours as needed.     traMADol 50 MG tablet  Commonly known as:  ULTRAM  Take 1 tablet (50 mg total) by mouth every 8 (eight) hours as  needed.     traZODone 100 MG tablet  Commonly known as:  DESYREL  TAKE 1 TABLET BY MOUTH AT BEDTIME     TRESIBA FLEXTOUCH 200 UNIT/ML Sopn  Generic drug:  Insulin Degludec  INJECT 100 UNITS INTO THE SKIN 2 (TWO)  TIMES DAILY.     Vitamin D (Ergocalciferol) 50000 units Caps capsule  Commonly known as:  DRISDOL  Take 1 capsule (50,000 Units total) by mouth every 7 (seven) days.        Allergies:  Allergies  Allergen Reactions  . Lisinopril Cough    Past Medical History  Diagnosis Date  . Hypertension   . Hyperlipidemia   . Allergy   . Stroke (HCC) 1993  . Diabetes mellitus 1995  . GERD (gastroesophageal reflux disease)     Past Surgical History  Procedure Laterality Date  . Appendectomy      Family History  Problem Relation Age of Onset  . Diabetes Mother   . Diabetes Brother     Social History:  reports that he has been smoking Cigarettes.  He has a 22.5 pack-year smoking history. He has never used smokeless tobacco. He reports that he drinks about 1.2 oz of alcohol per week. He reports that he does not use illicit drugs.  REVIEW of systems:    He has history of hyperlipidemia treated with Lipitor  Lab Results  Component Value Date   CHOL 107* 10/01/2015   HDL 46 10/01/2015   LDLCALC 33 10/01/2015   TRIG 141 10/01/2015   CHOLHDL 2.3 10/01/2015    History of hypertension treated with amlodipine   ERECTILE dysfunction: Has been prescribed Cialis, Concerned about the cost  Taking Plavix for history of stroke with no residual deficit  LABS:   Lab on 04/11/2016  Component Date Value Ref Range Status  . LH 04/11/2016 14.39* 1.50 - 9.30 mIU/mL Final   Comment: Male Reference Range:20-70 yrs     1.5-9.3 mIU/mL>70 yrs       3.1-35.6 mIU/mLFemale Reference Range:Follicular Phase     1.9-12.5 mIU/mLMidcycle             8.7-76.3 mIU/mLLuteal Phase         0.5-16.9 mIU/mL  Post Menopausal      15.9-54.0  mIU/mLPregnant             <1.5 mIU/mLContraceptives        0.7-5.6 mIU/mL   . Fructosamine 04/11/2016 264  0 - 285 umol/L Final   Comment: Published reference interval for apparently healthy subjects between age 3 and 51 is 110 - 285 umol/L and in a poorly controlled diabetic population is 228 - 563 umol/L with a mean of 396 umol/L.   Marland Kitchen Sodium 04/11/2016 139  135 - 145 mEq/L Final  . Potassium 04/11/2016 4.0  3.5 - 5.1 mEq/L Final  . Chloride 04/11/2016 102  96 - 112 mEq/L Final  . CO2 04/11/2016 26  19 - 32 mEq/L Final  . Glucose, Bld 04/11/2016 51* 70 - 99 mg/dL Final  . BUN 16/08/9603 18  6 - 23 mg/dL Final  . Creatinine, Ser 04/11/2016 1.12  0.40 - 1.50 mg/dL Final  . Calcium 54/07/8118 9.3  8.4 - 10.5 mg/dL Final  . GFR 14/78/2956 70.06  >60.00 mL/min Final  . Testosterone 04/11/2016 190.36* 300.00 - 890.00 ng/dL Final  Lab on 21/30/8657  Component Date Value Ref Range Status  . Hgb A1c MFr Bld 03/02/2016 7.2* 4.6 - 6.5 % Final   Glycemic Control Guidelines for People with Diabetes:Non Diabetic:  <6%Goal of Therapy: <7%Additional Action Suggested:  >8%   . Sodium 03/02/2016 141  135 - 145 mEq/L Final  . Potassium 03/02/2016 4.2  3.5 - 5.1 mEq/L Final  . Chloride 03/02/2016 105  96 - 112 mEq/L Final  .  CO2 03/02/2016 28  19 - 32 mEq/L Final  . Glucose, Bld 03/02/2016 82  70 - 99 mg/dL Final  . BUN 82/95/621305/12/2015 9  6 - 23 mg/dL Final  . Creatinine, Ser 03/02/2016 1.09  0.40 - 1.50 mg/dL Final  . Total Bilirubin 03/02/2016 0.7  0.2 - 1.2 mg/dL Final  . Alkaline Phosphatase 03/02/2016 65  39 - 117 U/L Final  . AST 03/02/2016 25  0 - 37 U/L Final  . ALT 03/02/2016 21  0 - 53 U/L Final  . Total Protein 03/02/2016 7.0  6.0 - 8.3 g/dL Final  . Albumin 08/65/784605/12/2015 4.0  3.5 - 5.2 g/dL Final  . Calcium 96/29/528405/12/2015 9.6  8.4 - 10.5 mg/dL Final  . GFR 13/24/401005/12/2015 72.31  >60.00 mL/min Final  . Testosterone 03/02/2016 190.29* 300.00 - 890.00 ng/dL Final     General Examination:   BP 124/72 mmHg  Pulse 77  Wt 257 lb (116.574 kg)  SpO2  95%   Assessment/ Plan:  DIABETES type II with obesity:  His fructosamine indicates fairly good control His home blood sugars were not reviewed as he did not bring his monitor and will review them on his next visit He was told to try [place] once a day but he has not done this.  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 He has been  symptomatic with low levels of testosterone  Since he was not able to get adequate testosterone levels or subjective improvement with Axiron he is now taking clomiphene He thinks he is subjectively doing better with this However his testosterone level is still about the same although he had not taken the clomiphene for a week before the lab work  Since he was subjectively feeling better he can try using the clomiphene for a longer period of time before trying to get AndroGel again   Patient Instructions  Skip Evaristo Buryresiba tonite then 160 in am daily More sugars after supper         [facility] CenterKUMAR,Meredith Kilbride  04/15/2016 11:55 AM  Note: This office note was prepared with Insurance underwriterDragon voice recognition system technology. Any transcriptional errors that result from this process are unintentional.

## 2016-04-22 ENCOUNTER — Telehealth: Payer: Self-pay

## 2016-04-22 NOTE — Telephone Encounter (Signed)
Spoke to pt. Advised him Dr Lucianne MussKumar said he would have to pay cash for the medication. He will see how much it is and let us know if he cannot get it to see if there are any other alternatives

## 2016-04-25 ENCOUNTER — Other Ambulatory Visit: Payer: Self-pay | Admitting: Endocrinology

## 2016-05-20 ENCOUNTER — Ambulatory Visit: Payer: PRIVATE HEALTH INSURANCE | Attending: Orthopaedic Surgery | Admitting: Physical Therapy

## 2016-05-20 ENCOUNTER — Encounter: Payer: Self-pay | Admitting: Physical Therapy

## 2016-05-20 DIAGNOSIS — R293 Abnormal posture: Secondary | ICD-10-CM | POA: Diagnosis present

## 2016-05-20 DIAGNOSIS — M25612 Stiffness of left shoulder, not elsewhere classified: Secondary | ICD-10-CM | POA: Diagnosis present

## 2016-05-20 DIAGNOSIS — M25512 Pain in left shoulder: Secondary | ICD-10-CM | POA: Diagnosis not present

## 2016-05-20 DIAGNOSIS — M6281 Muscle weakness (generalized): Secondary | ICD-10-CM | POA: Diagnosis present

## 2016-05-20 NOTE — Therapy (Signed)
[facility] [address] [place], [place], [address] Phone: [phone]   Fax:  [phone]  Physical Therapy Evaluation  Patient Details  Name: Drew Salinas MRN: [id number] Date of Birth: 11/04/1950 Referring Provider: Norlene CampbellPeter Whitfield MD  Encounter Date: 05/20/2016      PT End of Session - 05/20/16 0852    Visit Number 1   Number of Visits 17   Date for PT Re-Evaluation 07/15/16   PT Start Time 0800   PT Stop Time 0849   PT Time Calculation (min) 49 min   Activity Tolerance Patient tolerated treatment well   Behavior During Therapy [facility] for tasks assessed/performed      Past Medical History  Diagnosis Date  . Hypertension   . Hyperlipidemia   . Allergy   . Stroke (HCC) 1993  . Diabetes mellitus 1995  . GERD (gastroesophageal reflux disease)   . Sleep apnea     per patients wife    Past Surgical History  Procedure Laterality Date  . Appendectomy      There were no vitals filed for this visit.       Subjective Assessment - 05/20/16 0753    Subjective pt is a 65 y.o M s/p L shoulder DCR/SAD/ mini open RC repair and bicep tenodeiss on 05/12/2016. since the surgery things have been going well, still uses ice for pain multiple times a day for pain and swelling. pain fluctuates and mostly stays in the shoulder but will occasionally refer to the mid upper arm.    Pertinent History hx of L sided stroke   Limitations Lifting;House hold activities;Writing  dressing, grooming, eating   How long can you sit comfortably? unlimited   How long can you stand comfortably? 30 min   How long can you walk comfortably? 30 min   Diagnostic tests MRI 02/17/2016   Patient Stated Goals get full motion back, get strength back, decrease pain    Currently in Pain? Yes   Pain Score 5   took pain meds this AM   Pain Location Shoulder   Pain Orientation Left;Anterior;Proximal   Pain Descriptors / Indicators Stabbing;Aching;Sore   Pain  Type Surgical pain   Pain Radiating Towards occasionally to the mid lateral humerus   Pain Onset More than a month ago   Pain Frequency Constant   Aggravating Factors  movement   Pain Relieving Factors pain medication, having the arm up             [facility] PT Assessment - 05/20/16 0750    Assessment   Medical Diagnosis S/P L shoulder Arthorscopy SAD, DCR, mini open RC repair and biceps tenodesis   Referring Provider Norlene CampbellPeter Whitfield MD   Onset Date/Surgical Date 05/12/16   Hand Dominance Left   Next MD Visit 05/30/2016   Prior Therapy no   Precautions   Precautions Shoulder   Precaution Comments no movement, no lifting   Required Braces or Orthoses Sling   Restrictions   Weight Bearing Restrictions Yes   LUE Weight Bearing Non weight bearing   Balance Screen   Has the patient fallen in the past 6 months No   Has the patient had a decrease in activity level because of a fear of falling?  No   Is the patient reluctant to leave their home because of a fear of falling?  No   Home Environment   Living Environment Private residence   Living Arrangements Spouse/significant other   Available Help at Discharge Available  PRN/intermittently   Type of Home House   Home Access Stairs to enter   Entrance Stairs-Number of Steps 3   Entrance Stairs-Rails Can reach both   Home Layout Two level   Alternate Level Stairs-Number of Steps 15   Alternate Level Stairs-Rails Left   Home Equipment Other (comment)  sling   Prior Function   Level of Independence Needs assistance with ADLs;Needs assistance with homemaking;Other (comment)   Dressing Maximal   Meal Prep Maximal   Vocation Full time employment  logistics   Vocation Requirements prolonged sitting and typing    Leisure watching movies,    Cognition   Overall Cognitive Status Within Functional Limits for tasks assessed   Observation/Other Assessments   Observations incisions appear intact/ clean and healing well with steri-stirps    Focus on Therapeutic Outcomes (FOTO)  68% limited  predicted 35% limited   Posture/Postural Control   Posture/Postural Control Postural limitations   Postural Limitations Rounded Shoulders;Forward head   ROM / Strength   AROM / PROM / Strength AROM;PROM;Strength   AROM   Overall AROM Comments L shoulder not assess due to precautions   AROM Assessment Site Shoulder   Right/Left Shoulder Right;Left   Right Shoulder Extension 60 Degrees   Right Shoulder Flexion 130 Degrees   Right Shoulder ABduction 120 Degrees   Right Shoulder Internal Rotation 72 Degrees   Right Shoulder External Rotation 68 Degrees   PROM   PROM Assessment Site Shoulder   Right/Left Shoulder Left   Left Shoulder Flexion 42 Degrees  ERP   Left Shoulder ABduction 86 Degrees  ERP   Left Shoulder Internal Rotation 45 Degrees  ERP   Left Shoulder External Rotation 8 Degrees  ERP   Strength   Overall Strength Comments L shoulder not assessed due to precautions   Strength Assessment Site Shoulder;Hand   Right/Left Shoulder Right;Left   Right Shoulder Flexion 4-/5   Right Shoulder Extension 5/5   Right Shoulder ABduction 4-/5   Right Shoulder Internal Rotation 4+/5   Right Shoulder External Rotation 4+/5   Right Hand Grip (lbs) 75.6  75,76,76   Left Hand Grip (lbs) 56  51,58,59   Palpation   Palpation comment  tenderness located around the surgical site, and in the biceps muscle belly, and supraspinauts/ infrasinatus muscle bellys   Transfers   Transfers Sit to Supine;Supine to Sit   Supine to Sit 4: Min assist   Sit to Supine 4: Min assist                           PT Education - 05/20/16 0851    Education provided Yes   Education Details evaluation findings, POC, goals, HEP with proper form / treatment rationale.    Person(s) Educated Patient;Spouse  wife   Methods Explanation;Handout;Verbal cues;Demonstration   Comprehension Verbalized understanding;Verbal cues required           PT Short Term Goals - 05/20/16 0905    PT SHORT TERM GOAL #1   Title independent with initial HEP (06/20/2016)   Time 4   Period Weeks   Status New   PT SHORT TERM GOAL #2   Title verbalize / demo techniques to reduce L shoulder pain and inflammation via RICE and HEP (06/20/2016)   Time 4   Period Weeks   Status New   PT SHORT TERM GOAL #3   Title improve L shoulder PROM flexion/ abduction to >/= 110 and ER to >/=  30 degrees to assist with improving ROM (06/20/2016)   Time 4   Period Weeks   Status New   PT SHORT TERM GOAL #4   Title pt will improve L grip strength to >/= 8# to demonstrate improvement in L shoulder function (06/20/2016)   Time 4   Period Weeks   Status New           PT Long Term Goals - 05/20/16 0915    PT LONG TERM GOAL #1   Title independent with all advanced HEP given throughtout treatment (07/15/2016)   Time 8   Period Weeks   Status New   PT LONG TERM GOAL #2   Title increase L shoulder AROM flexion/ abduction to >/= 120 degrees and ER >/= 45 degrees with </= 2/10 pain to assist with ADLs and personal grooming and dressing (07/15/2016)   Time 8   Period Weeks   Status New   PT LONG TERM GOAL #3   Title improve L shoulder overall strength to >/=4/5 to with </= 2/10 pain to assist with ADLS and job related activities  (07/15/2016)   Time 8   Period Weeks   Status New   PT LONG TERM GOAL #4   Title pt will be able to lift >/= 8# above his head with </= 2/10 pain to assist with funcitonal lifting and carrying activities (07/15/2016)   Time 8   Period Weeks   Status New   PT LONG TERM GOAL #5   Title increase FOTO score to </= 35% limited to demonstrate improvement in function at discharge (07/15/2016)   Time 8   Period Weeks   Status New               Plan - 05/20/16 1610    Clinical Impression Statement Mr. Fout presents to OPPT as a moderate complexity evaluatin s/p L shoulder SAD/ DCR / mini open RC repair and biceps tenodesis.  Incisons appears intact cleaning and healing well.  AROM of the L shoulder wasn't assessed due to precautions. PROM demonstrated limited mobility secondary to pain and muscle guarding.  tenderness located around the surgical site, and in the biceps muscle belly, and supraspinauts/ infrasinatus muscle bellys. pt would benefit from physical therapy to decrease L shoulder pain, improve mobility, increase strength and return pt to PLOF by addressing the deficits listed.   Mod complexity due to hx of stroke, HTN DM.  eval findings: limited shouler mobility, pain, limited strength, abnormal posture.  Flucuating pain    Rehab Potential Good   PT Frequency 2x / week   PT Duration 8 weeks   PT Treatment/Interventions ADLs/Self Care Home Management;Cryotherapy;Electrical Stimulation;Iontophoresis 4mg /ml Dexamethasone;Moist Heat;Therapeutic exercise;Therapeutic activities;Manual techniques;Ultrasound;Patient/family education;Passive range of motion;Taping;Vasopneumatic Device   PT Next Visit Plan assess/ review HEP, scapular stabilizers, PROM, modalities    PT Home Exercise Plan wand flexion/ abduction/ ER, shoulder retraction, pendulums   Consulted and Agree with Plan of Care Patient      Patient will benefit from skilled therapeutic intervention in order to improve the following deficits and impairments:  Pain, Improper body mechanics, Postural dysfunction, Decreased endurance, Decreased activity tolerance, Decreased range of motion, Hypomobility, Decreased strength, Decreased scar mobility, Increased fascial restricitons, Impaired UE functional use, Impaired flexibility, Increased edema  Visit Diagnosis: Pain in left shoulder - Plan: PT plan of care cert/re-cert  Abnormal posture - Plan: PT plan of care cert/re-cert  Stiffness of left shoulder, not elsewhere classified - Plan: PT plan of care cert/re-cert  Muscle weakness (generalized) - Plan: PT plan of care cert/re-cert     Problem List Patient  Active Problem List   Diagnosis Date Noted  . De Quervain's tenosynovitis, right 10/19/2015  . 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 Fracture of scaphoid of right wrist with delayed healing 09/03/2015  . Diabetic peripheral neuropathy associated with type 2 diabetes mellitus (HCC) 05/22/2015  . Essential hypertension, benign 05/21/2015  . Erectile dysfunction 10/02/2013  . Hypogonadism male 10/02/2013  . Insulin dependent type 2 diabetes mellitus, uncontrolled (HCC) 05/23/2012  . Hyperlipidemia 05/23/2012  . Obesity, Class II, BMI 35-39.9, with comorbidity (HCC) 05/23/2012  . Depression 05/23/2012  . Tobacco user 05/23/2012  . History of stroke without residual deficits 05/23/2012   Lulu Riding PT, DPT, LAT, ATC  05/20/2016  9:34 AM      [facility] [address] [place], [place], [address] Phone: [phone]   Fax:  [phone]  Name: Drew Salinas MRN: [id number] Date of Birth: 1951-01-28

## 2016-05-23 ENCOUNTER — Ambulatory Visit: Payer: PRIVATE HEALTH INSURANCE | Admitting: Physical Therapy

## 2016-05-23 DIAGNOSIS — M25512 Pain in left shoulder: Secondary | ICD-10-CM | POA: Diagnosis not present

## 2016-05-23 DIAGNOSIS — M25612 Stiffness of left shoulder, not elsewhere classified: Secondary | ICD-10-CM

## 2016-05-23 DIAGNOSIS — R293 Abnormal posture: Secondary | ICD-10-CM

## 2016-05-23 DIAGNOSIS — M6281 Muscle weakness (generalized): Secondary | ICD-10-CM

## 2016-05-23 NOTE — Therapy (Signed)
[facility] [address] [place], [place], [address] Phone: [phone]   Fax:  [phone]  Physical Therapy Treatment  Patient Details  Name: Drew Salinas MRN: [id number] Date of Birth: 06-01-1951 Referring Provider: Norlene Campbell MD  Encounter Date: 05/23/2016      PT End of Session - 05/23/16 0838    Visit Number 2   Number of Visits 17   Date for PT Re-Evaluation 07/15/16   PT Start Time 0800   PT Stop Time 0850   PT Time Calculation (min) 50 min   Activity Tolerance Patient tolerated treatment well   Behavior During Therapy [facility] for tasks assessed/performed      Past Medical History:  Diagnosis Date  . Allergy   . Diabetes mellitus 1995  . GERD (gastroesophageal reflux disease)   . Hyperlipidemia   . Hypertension   . Sleep apnea    per patients wife  . Stroke [facility]) 1993    Past Surgical History:  Procedure Laterality Date  . APPENDECTOMY      There were no vitals filed for this visit.      Subjective Assessment - 05/23/16 0759    Subjective "still some pain acorss the top of the shoulder, been consistent with the exercises"    Currently in Pain? Yes   Pain Score 4   took muscle relaxer this AM   Pain Descriptors / Indicators Aching;Sore   Pain Type Surgical pain   Pain Onset More than a month ago   Pain Frequency Constant   Aggravating Factors  movement   Pain Relieving Factors pain medication/ muscle relaxer                          [facility] Adult PT Treatment/Exercise - 05/23/16 0805      Shoulder Exercises: Seated   Retraction AROM;Strengthening;Both;15 reps  x 2 sets, with tactile cues to squeeze shoulder blades   Other Seated Exercises 2 x 10 bicep curls   with controlled eccentics      Shoulder Exercises: ROM/Strengthening   Other ROM/Strengthening Exercises wand flexion 3 x 10/ abduction/ ER 2 x 10  verbal cues to avoid rocking body, ER in supine     Modalities   Modalities Cryotherapy     Cryotherapy   Number Minutes Cryotherapy 10 Minutes   Cryotherapy Location Shoulder  L in supine   Type of Cryotherapy Ice pack     Manual Therapy   Manual Therapy Joint mobilization;Passive ROM   Joint Mobilization grade 1-2 GH mobs inferior, posterior, and anterior 3 x 15 oscillations ea.   Passive ROM flexion/ abduction with gentle distaction oscillations                 PT Education - 05/23/16 0837    Education provided Yes   Education Details HEP review with proper technique with wand exercises to avoid rocking body with wand exercises   Person(s) Educated Patient   Methods Explanation;Demonstration;Verbal cues   Comprehension Verbalized understanding;Verbal cues required          PT Short Term Goals - 05/20/16 0905      PT SHORT TERM GOAL #1   Title independent with initial HEP (06/20/2016)   Time 4   Period Weeks   Status New     PT SHORT TERM GOAL #2   Title verbalize / demo techniques to reduce L shoulder pain and inflammation via RICE and HEP (06/20/2016)   Time  4   Period Weeks   Status New     PT SHORT TERM GOAL #3   Title improve L shoulder PROM flexion/ abduction to >/= 110 and ER to >/= 30 degrees to assist with improving ROM (06/20/2016)   Time 4   Period Weeks   Status New     PT SHORT TERM GOAL #4   Title pt will improve L grip strength to >/= 8# to demonstrate improvement in L shoulder function (06/20/2016)   Time 4   Period Weeks   Status New           PT Long Term Goals - 05/20/16 0915      PT LONG TERM GOAL #1   Title independent with all advanced HEP given throughtout treatment (07/15/2016)   Time 8   Period Weeks   Status New     PT LONG TERM GOAL #2   Title increase L shoulder AROM flexion/ abduction to >/= 120 degrees and ER >/= 45 degrees with </= 2/10 pain to assist with ADLs and personal grooming and dressing (07/15/2016)   Time 8   Period Weeks   Status New     PT LONG TERM GOAL #3    Title improve L shoulder overall strength to >/=4/5 to with </= 2/10 pain to assist with ADLS and job related activities  (07/15/2016)   Time 8   Period Weeks   Status New     PT LONG TERM GOAL #4   Title pt will be able to lift >/= 8# above his head with </= 2/10 pain to assist with funcitonal lifting and carrying activities (07/15/2016)   Time 8   Period Weeks   Status New     PT LONG TERM GOAL #5   Title increase FOTO score to </= 35% limited to demonstrate improvement in function at discharge (07/15/2016)   Time 8   Period Weeks   Status New               Plan - 05/23/16 1610    Clinical Impression Statement Mr. Koontz reports being consistent with some of his HEP, but hasn't been doing the wand exercises. Reviewed HEP which he required verbal cues for proper form. work on [employer] scapular stabilizers, and bicep curls pt reported some soreness but was able to complete exercise. Utilized ice pack post session calm down soreness.    PT Next Visit Plan review wand exercsies PRN, PROM/ shoulder mobs, scapular stabilizers, unweight bicep/ tricep strengtening, pulleys,   modalities    PT Home Exercise Plan reviewed Wand exercises   Consulted and Agree with Plan of Care Patient      Patient will benefit from skilled therapeutic intervention in order to improve the following deficits and impairments:  Pain, Improper body mechanics, Postural dysfunction, Decreased endurance, Decreased activity tolerance, Decreased range of motion, Hypomobility, Decreased strength, Decreased scar mobility, Increased fascial restricitons, Impaired UE functional use, Impaired flexibility, Increased edema  Visit Diagnosis: Pain in left shoulder  Abnormal posture  Stiffness of left shoulder, not elsewhere classified  Muscle weakness (generalized)     Problem List Patient Active Problem List   Diagnosis Date Noted  . De Quervain's tenosynovitis, right 10/19/2015  . 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 Fracture of scaphoid of right wrist with delayed healing 09/03/2015  . Diabetic peripheral neuropathy associated with type 2 diabetes mellitus (HCC) 05/22/2015  . Essential hypertension, benign 05/21/2015  . Erectile  dysfunction 10/02/2013  . Hypogonadism male 10/02/2013  . Insulin dependent type 2 diabetes mellitus, uncontrolled (HCC) 05/23/2012  . Hyperlipidemia 05/23/2012  . Obesity, Class II, BMI 35-39.9, with comorbidity (HCC) 05/23/2012  . Depression 05/23/2012  . Tobacco user 05/23/2012  . History of stroke without residual deficits 05/23/2012   Lulu Riding PT, DPT, LAT, ATC  05/23/16  8:52 AM      [facility] [address] [place], [place], [address] Phone: [phone]   Fax:  [phone]  Name: Alano Blasco MRN: [id number] Date of Birth: [birth year]

## 2016-05-25 ENCOUNTER — Ambulatory Visit: Payer: PRIVATE HEALTH INSURANCE

## 2016-05-25 ENCOUNTER — Telehealth: Payer: Self-pay | Admitting: Endocrinology

## 2016-05-25 ENCOUNTER — Other Ambulatory Visit: Payer: Self-pay

## 2016-05-25 DIAGNOSIS — M25512 Pain in left shoulder: Secondary | ICD-10-CM

## 2016-05-25 DIAGNOSIS — M25612 Stiffness of left shoulder, not elsewhere classified: Secondary | ICD-10-CM

## 2016-05-25 DIAGNOSIS — M6281 Muscle weakness (generalized): Secondary | ICD-10-CM

## 2016-05-25 MED ORDER — JANUMET 50-1000 MG PO TABS: 1.0000 | ORAL_TABLET | Freq: Two times a day (BID) | ORAL | 2 refills | Status: DC

## 2016-05-25 NOTE — Telephone Encounter (Signed)
PT needs Janumet sent into [facility] ASAP

## 2016-05-25 NOTE — Therapy (Signed)
[facility] [address] [place], [place], [address] Phone: [phone]   Fax:  [phone]  Physical Therapy Treatment  Patient Details  Name: Drew Salinas MRN: [id number] Date of Birth: May 06, 1951 Referring Provider: Norlene Campbell MD  Encounter Date: 05/25/2016      PT End of Session - 05/25/16 0700    Visit Number 3   Number of Visits 17   Date for PT Re-Evaluation 07/15/16   PT Start Time 0700   PT Stop Time 0732   PT Time Calculation (min) 32 min   Activity Tolerance Patient tolerated treatment well   Behavior During Therapy [facility] for tasks assessed/performed      Past Medical History:  Diagnosis Date  . Allergy   . Diabetes mellitus 1995  . GERD (gastroesophageal reflux disease)   . Hyperlipidemia   . Hypertension   . Sleep apnea    per patients wife  . Stroke [facility]) 1993    Past Surgical History:  Procedure Laterality Date  . APPENDECTOMY      There were no vitals filed for this visit.      Subjective Assessment - 05/25/16 0704    Subjective Pain mild this AM . Did HEP   Currently in Pain? Yes   Pain Score 2    Pain Location Shoulder   Pain Orientation Left;Anterior;Proximal   Pain Type Surgical pain   Pain Onset More than a month ago   Aggravating Factors  moving arm   Pain Relieving Factors Medication   Multiple Pain Sites No            [facility] PT Assessment - 05/25/16 0724      PROM   Left Shoulder Flexion 140 Degrees   Left Shoulder ABduction 90 Degrees   Left Shoulder Internal Rotation 60 Degrees   Left Shoulder External Rotation 30 Degrees                     [facility] Adult PT Treatment/Exercise - 05/25/16 0706      Shoulder Exercises: Supine   Other Supine Exercises Dowel flexion and ER x 10 2 sets. Cued to keep hands closer with flexion to asssit LT arm , then triceps with upper arm supported by PT x12      Shoulder Exercises: Seated   Other Seated Exercises 15 bicep  curls     Shoulder Exercises: Standing   Other Standing Exercises pendulum x 10 each way, followed by dowel active ER and abduction x 15     Manual Therapy   Joint Mobilization grade 2 GH mobs inferior, posterior, and anterior 2 x 60 oscillations ea.   Passive ROM flexion/ abduction with gentle distaction and with inf glides Gr 2 at end range /none pain range                   PT Short Term Goals - 05/25/16 0735      PT SHORT TERM GOAL #1   Title independent with initial HEP (06/20/2016)   Status Achieved           PT Long Term Goals - 05/20/16 0915      PT LONG TERM GOAL #1   Title independent with all advanced HEP given throughtout treatment (07/15/2016)   Time 8   Period Weeks   Status New     PT LONG TERM GOAL #2   Title increase L shoulder AROM flexion/ abduction to >/= 120 degrees and ER >/= 45  degrees with </= 2/10 pain to assist with ADLs and personal grooming and dressing (07/15/2016)   Time 8   Period Weeks   Status New     PT LONG TERM GOAL #3   Title improve L shoulder overall strength to >/=4/5 to with </= 2/10 pain to assist with ADLS and job related activities  (07/15/2016)   Time 8   Period Weeks   Status New     PT LONG TERM GOAL #4   Title pt will be able to lift >/= 8# above his head with </= 2/10 pain to assist with funcitonal lifting and carrying activities (07/15/2016)   Time 8   Period Weeks   Status New     PT LONG TERM GOAL #5   Title increase FOTO score to </= 35% limited to demonstrate improvement in function at discharge (07/15/2016)   Time 8   Period Weeks   Status New               Plan - 05/25/16 0701    Clinical Impression Statement Gentle PROM improved. Pain undercontrol. Declined ice as little to no pain. doing well within protocol. Advance to week 3 in protocol   PT Treatment/Interventions ADLs/Self Care Home Management;Cryotherapy;Electrical Stimulation;Iontophoresis 4mg /ml Dexamethasone;Moist Heat;Therapeutic  exercise;Therapeutic activities;Manual techniques;Ultrasound;Patient/family education;Passive range of motion;Taping;Vasopneumatic Device   PT Next Visit Plan Continue mobs/ROM strength within protocol   Consulted and Agree with Plan of Care Patient      Patient will benefit from skilled therapeutic intervention in order to improve the following deficits and impairments:  Pain, Improper body mechanics, Postural dysfunction, Decreased endurance, Decreased activity tolerance, Decreased range of motion, Hypomobility, Decreased strength, Decreased scar mobility, Increased fascial restricitons, Impaired UE functional use, Impaired flexibility, Increased edema  Visit Diagnosis: Pain in left shoulder  Stiffness of left shoulder, not elsewhere classified  Muscle weakness (generalized)     Problem List Patient Active Problem List   Diagnosis Date Noted  . De Quervain's tenosynovitis, right 10/19/2015  . 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 Fracture of scaphoid of right wrist with delayed healing 09/03/2015  . Diabetic peripheral neuropathy associated with type 2 diabetes mellitus (HCC) 05/22/2015  . Essential hypertension, benign 05/21/2015  . Erectile dysfunction 10/02/2013  . Hypogonadism male 10/02/2013  . Insulin dependent type 2 diabetes mellitus, uncontrolled (HCC) 05/23/2012  . Hyperlipidemia 05/23/2012  . Obesity, Class II, BMI 35-39.9, with comorbidity (HCC) 05/23/2012  . Depression 05/23/2012  . Tobacco user 05/23/2012  . History of stroke without residual deficits 05/23/2012    Caprice Red PT 05/25/2016, 7:36 AM  [facility] [address] [place], [place], [address] Phone: [phone]   Fax:  [phone]  Name: Kaleth Jacks MRN: [id number] Date of Birth: 10/10/1951

## 2016-05-25 NOTE — Telephone Encounter (Signed)
Sent in rx for janumet to patient pharmacy.

## 2016-05-30 ENCOUNTER — Other Ambulatory Visit: Payer: Self-pay | Admitting: Family Medicine

## 2016-05-31 ENCOUNTER — Ambulatory Visit: Payer: PRIVATE HEALTH INSURANCE | Attending: Orthopaedic Surgery

## 2016-05-31 DIAGNOSIS — R293 Abnormal posture: Secondary | ICD-10-CM | POA: Diagnosis present

## 2016-05-31 DIAGNOSIS — M25612 Stiffness of left shoulder, not elsewhere classified: Secondary | ICD-10-CM | POA: Diagnosis present

## 2016-05-31 DIAGNOSIS — M6281 Muscle weakness (generalized): Secondary | ICD-10-CM | POA: Insufficient documentation

## 2016-05-31 DIAGNOSIS — M7582 Other shoulder lesions, left shoulder: Secondary | ICD-10-CM | POA: Diagnosis present

## 2016-05-31 DIAGNOSIS — M25512 Pain in left shoulder: Secondary | ICD-10-CM | POA: Diagnosis not present

## 2016-05-31 DIAGNOSIS — R29898 Other symptoms and signs involving the musculoskeletal system: Secondary | ICD-10-CM | POA: Diagnosis present

## 2016-05-31 NOTE — Therapy (Signed)
[facility] [address] [place], [place], [address] Phone: [phone]   Fax:  [phone]  Physical Therapy Treatment  Patient Details  Name: Drew Salinas MRN: [id number] Date of Birth: [birth year] Referring Provider: Norlene Campbell MD  Encounter Date: 05/31/2016      PT End of Session - 05/31/16 0846    Visit Number 4   Number of Visits 17   Date for PT Re-Evaluation 07/15/16   PT Start Time 0805   PT Stop Time 0845   PT Time Calculation (min) 40 min   Activity Tolerance Patient tolerated treatment well;No increased pain   Behavior During Therapy [facility]FL for tasks assessed/performed      Past Medical History:  Diagnosis Date  . Allergy   . Diabetes mellitus 1995  . GERD (gastroesophageal reflux disease)   . Hyperlipidemia   . Hypertension   . Sleep apnea    per patients wife  . Stroke [facility]) 1993    Past Surgical History:  Procedure Laterality Date  . APPENDECTOMY      There were no vitals filed for this visit.      Subjective Assessment - 05/31/16 0809    Subjective MD said to RTW 1/2  days .    Currently in Pain? Yes   Pain Score 3    Pain Location Shoulder   Pain Orientation Left;Anterior;Proximal   Pain Descriptors / Indicators Aching;Sore   Pain Type Surgical pain   Pain Onset More than a month ago   Pain Frequency Constant   Aggravating Factors  moving arm   Pain Relieving Factors med , rest   Multiple Pain Sites No                         [facility] Adult PT Treatment/Exercise - 05/31/16 0001      Self-Care   Self-Care Other Self-Care Comments   Other Self-Care Comments  discussed return to work using comuter and use of pillows to support arm and to stop if pain increases with work.      Shoulder Exercises: Sidelying   Other Sidelying Exercises Active Lt scapula ROM pro/retract and elevation /depression x 15  each     Shoulder Exercises: Isometric Strengthening   Flexion  Limitations 10 reps 5 sec   Extension Limitations 10 reps 5 sec hold   External Rotation Limitations 10 reps 5 sec   Internal Rotation Limitations 10 reps 5 sec   ABduction Limitations 10 reps 5 sec   ADduction Limitations 10 reps 5 sec     Manual Therapy   Joint Mobilization grade 2 GH mobs inferior, posterior, and anterior 2 x 60 oscillations ea.   Passive ROM flexion/ abduction with gentle distaction and with inf glides Gr 2 at end range /none pain range   Cue to relax Lt arm with ROM                PT Education - 05/31/16 0845    Education provided Yes   Education Details isometrics, self care   Methods Explanation;Demonstration;Tactile cues;Verbal cues;Handout   Comprehension Returned demonstration;Verbalized understanding          PT Short Term Goals - 05/25/16 0735      PT SHORT TERM GOAL #1   Title independent with initial HEP (06/20/2016)   Status Achieved           PT Long Term Goals - 05/20/16 0915      PT LONG  TERM GOAL #1   Title independent with all advanced HEP given throughtout treatment (07/15/2016)   Time 8   Period Weeks   Status New     PT LONG TERM GOAL #2   Title increase L shoulder AROM flexion/ abduction to >/= 120 degrees and ER >/= 45 degrees with </= 2/10 pain to assist with ADLs and personal grooming and dressing (07/15/2016)   Time 8   Period Weeks   Status New     PT LONG TERM GOAL #3   Title improve L shoulder overall strength to >/=4/5 to with </= 2/10 pain to assist with ADLS and job related activities  (07/15/2016)   Time 8   Period Weeks   Status New     PT LONG TERM GOAL #4   Title pt will be able to lift >/= 8# above his head with </= 2/10 pain to assist with funcitonal lifting and carrying activities (07/15/2016)   Time 8   Period Weeks   Status New     PT LONG TERM GOAL #5   Title increase FOTO score to </= 35% limited to demonstrate improvement in function at discharge (07/15/2016)   Time 8   Period Weeks    Status New               Plan - 05/31/16 0847    Clinical Impression Statement Tolerating treatment into week 3 . Started Isometrics and cautioned pt to ease off or stop if pain escalates with exercise.    PT Next Visit Plan Continue mobs/ROM strength within protocol   PT Home Exercise Plan isometrics   Consulted and Agree with Plan of Care Patient      Patient will benefit from skilled therapeutic intervention in order to improve the following deficits and impairments:  Pain, Improper body mechanics, Postural dysfunction, Decreased endurance, Decreased activity tolerance, Decreased range of motion, Hypomobility, Decreased strength, Decreased scar mobility, Increased fascial restricitons, Impaired UE functional use, Impaired flexibility, Increased edema  Visit Diagnosis: Pain in left shoulder  Stiffness of left shoulder, not elsewhere classified  Muscle weakness (generalized)     Problem List Patient Active Problem List   Diagnosis Date Noted  . De Quervain's tenosynovitis, right 10/19/2015  . 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 Fracture of scaphoid of right wrist with delayed healing 09/03/2015  . Diabetic peripheral neuropathy associated with type 2 diabetes mellitus (HCC) 05/22/2015  . Essential hypertension, benign 05/21/2015  . Erectile dysfunction 10/02/2013  . Hypogonadism male 10/02/2013  . Insulin dependent type 2 diabetes mellitus, uncontrolled (HCC) 05/23/2012  . Hyperlipidemia 05/23/2012  . Obesity, Class II, BMI 35-39.9, with comorbidity (HCC) 05/23/2012  . Depression 05/23/2012  . Tobacco user 05/23/2012  . History of stroke without residual deficits 05/23/2012    Caprice Red  PT 05/31/2016, 8:49 AM  [facility] [address] [place], [place], [address] Phone: [phone]   Fax:  [phone]  Name: Drew Salinas MRN: [id number] Date of Birth:  1951/06/10

## 2016-06-02 ENCOUNTER — Ambulatory Visit: Payer: PRIVATE HEALTH INSURANCE | Admitting: Physical Therapy

## 2016-06-02 DIAGNOSIS — M25612 Stiffness of left shoulder, not elsewhere classified: Secondary | ICD-10-CM

## 2016-06-02 DIAGNOSIS — R293 Abnormal posture: Secondary | ICD-10-CM

## 2016-06-02 DIAGNOSIS — M25512 Pain in left shoulder: Secondary | ICD-10-CM

## 2016-06-02 DIAGNOSIS — M6281 Muscle weakness (generalized): Secondary | ICD-10-CM

## 2016-06-02 NOTE — Therapy (Signed)
[facility] [address] [place], [place], [address] Phone: [phone]   Fax:  [phone]  Physical Therapy Treatment  Patient Details  Name: Drew Salinas MRN: [id number] Date of Birth: 1950/11/21 Referring Provider: Norlene Campbell MD  Encounter Date: 06/02/2016      PT End of Session - 06/02/16 0843    Visit Number 5   Number of Visits 17   Date for PT Re-Evaluation 07/15/16   PT Start Time 0800   PT Stop Time 0855   PT Time Calculation (min) 55 min   Activity Tolerance Patient tolerated treatment well   Behavior During Therapy [facility] for tasks assessed/performed      Past Medical History:  Diagnosis Date  . Allergy   . Diabetes mellitus 1995  . GERD (gastroesophageal reflux disease)   . Hyperlipidemia   . Hypertension   . Sleep apnea    per patients wife  . Stroke [facility]) 1993    Past Surgical History:  Procedure Laterality Date  . APPENDECTOMY      There were no vitals filed for this visit.      Subjective Assessment - 06/02/16 0759    Subjective "off and on sore, its coming along. working 1/2 days, but I am getting more aches"   Currently in Pain? Yes   Pain Score 3   took pain meds this AM   Pain Location Shoulder   Pain Orientation Left;Anterior;Proximal   Pain Descriptors / Indicators Aching;Sore   Pain Type Surgical pain   Pain Onset More than a month ago   Pain Frequency Intermittent            [facility] PT Assessment - 06/02/16 0810      PROM   Left Shoulder Flexion 145 Degrees   Left Shoulder ABduction 120 Degrees                     [facility] Adult PT Treatment/Exercise - 06/02/16 0001      Shoulder Exercises: Sidelying   Other Sidelying Exercises protraction/ retraction using UE ranger 2 x 10 with L shoulder     Shoulder Exercises: Pulleys   Flexion 3 minutes   ABduction 3 minutes     Shoulder Exercises: Isometric Strengthening   Flexion Limitations 10 reps 5 sec    Extension Limitations 10 reps 5 sec hold   External Rotation Limitations 10 reps 5 sec   Internal Rotation Limitations 10 reps 5 sec   ABduction Limitations 10 reps 5 sec   ADduction Limitations 10 reps 5 sec     Cryotherapy   Number Minutes Cryotherapy 10 Minutes   Cryotherapy Location Shoulder   Type of Cryotherapy Ice pack     Manual Therapy   Manual therapy comments manual trigger release on L upper trap x 3    Joint Mobilization grade 2 GH mobs inferior, posterior, and anterior 2 x 60 oscillations ea.   Passive ROM abduction with gentle distaction and with inf glides Gr 2 at end range /none pain range   Cue to relax Lt arm with ROM                  PT Short Term Goals - 05/25/16 0735      PT SHORT TERM GOAL #1   Title independent with initial HEP (06/20/2016)   Status Achieved           PT Long Term Goals - 05/20/16 0915      PT  LONG TERM GOAL #1   Title independent with all advanced HEP given throughtout treatment (07/15/2016)   Time 8   Period Weeks   Status New     PT LONG TERM GOAL #2   Title increase L shoulder AROM flexion/ abduction to >/= 120 degrees and ER >/= 45 degrees with </= 2/10 pain to assist with ADLs and personal grooming and dressing (07/15/2016)   Time 8   Period Weeks   Status New     PT LONG TERM GOAL #3   Title improve L shoulder overall strength to >/=4/5 to with </= 2/10 pain to assist with ADLS and job related activities  (07/15/2016)   Time 8   Period Weeks   Status New     PT LONG TERM GOAL #4   Title pt will be able to lift >/= 8# above his head with </= 2/10 pain to assist with funcitonal lifting and carrying activities (07/15/2016)   Time 8   Period Weeks   Status New     PT LONG TERM GOAL #5   Title increase FOTO score to </= 35% limited to demonstrate improvement in function at discharge (07/15/2016)   Time 8   Period Weeks   Status New               Plan - 06/02/16 6073    Clinical Impression Statement  Mr. Lienhard continues to make progress with therapy improving PROM for flexion/ abuction with decreased pain. continued work on isometrics for shoulder strengthening, and scapular stabilizers with verbal cues to go slowly to avoid pain and muscle guarding.    PT Next Visit Plan Continue mobs/ROM strength within protocol, goals.    Consulted and Agree with Plan of Care Patient      Patient will benefit from skilled therapeutic intervention in order to improve the following deficits and impairments:  Pain, Improper body mechanics, Postural dysfunction, Decreased endurance, Decreased activity tolerance, Decreased range of motion, Hypomobility, Decreased strength, Decreased scar mobility, Increased fascial restricitons, Impaired UE functional use, Impaired flexibility, Increased edema  Visit Diagnosis: Pain in left shoulder  Stiffness of left shoulder, not elsewhere classified  Muscle weakness (generalized)  Abnormal posture     Problem List Patient Active Problem List   Diagnosis Date Noted  . De Quervain's tenosynovitis, right 10/19/2015  . 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 Fracture of scaphoid of right wrist with delayed healing 09/03/2015  . Diabetic peripheral neuropathy associated with type 2 diabetes mellitus (HCC) 05/22/2015  . Essential hypertension, benign 05/21/2015  . Erectile dysfunction 10/02/2013  . Hypogonadism male 10/02/2013  . Insulin dependent type 2 diabetes mellitus, uncontrolled (HCC) 05/23/2012  . Hyperlipidemia 05/23/2012  . Obesity, Class II, BMI 35-39.9, with comorbidity (HCC) 05/23/2012  . Depression 05/23/2012  . Tobacco user 05/23/2012  . History of stroke without residual deficits 05/23/2012    Lulu Riding 06/02/2016, 8:55 AM  [facility] [address] [place], [place], [address] Phone: [phone]   Fax:  [phone]  Name: Drew Salinas MRN: [id number] Date of Birth: October 29, 1951

## 2016-06-03 ENCOUNTER — Encounter: Payer: PRIVATE HEALTH INSURANCE | Admitting: Physical Therapy

## 2016-06-04 ENCOUNTER — Other Ambulatory Visit: Payer: Self-pay | Admitting: Family Medicine

## 2016-06-06 ENCOUNTER — Encounter: Payer: PRIVATE HEALTH INSURANCE | Admitting: Physical Therapy

## 2016-06-06 ENCOUNTER — Ambulatory Visit: Payer: PRIVATE HEALTH INSURANCE | Admitting: Physical Therapy

## 2016-06-06 DIAGNOSIS — M6281 Muscle weakness (generalized): Secondary | ICD-10-CM

## 2016-06-06 DIAGNOSIS — M25512 Pain in left shoulder: Secondary | ICD-10-CM | POA: Diagnosis not present

## 2016-06-06 DIAGNOSIS — R293 Abnormal posture: Secondary | ICD-10-CM

## 2016-06-06 DIAGNOSIS — M25612 Stiffness of left shoulder, not elsewhere classified: Secondary | ICD-10-CM

## 2016-06-06 NOTE — Therapy (Signed)
[place], [place], [address] Phone: [phone]   Fax:  [phone]  Physical Therapy Treatment  Patient Details  Name: Drew Salinas MRN: [id number] Date of Birth: [birth year] Referring Provider: Joni Fears MD  Encounter Date: 06/06/2016      PT End of Session - 06/06/16 1009    Visit Number 6   Number of Visits 17   Date for PT Re-Evaluation 07/15/16   PT Start Time 0848   PT Stop Time 0939   PT Time Calculation (min) 51 min   Activity Tolerance Patient tolerated treatment well   Behavior During Therapy [facility] for tasks assessed/performed      Past Medical History:  Diagnosis Date  . Allergy   . Diabetes mellitus 1995  . GERD (gastroesophageal reflux disease)   . Hyperlipidemia   . Hypertension   . Sleep apnea    per patients wife  . Stroke [facility]) 1993    Past Surgical History:  Procedure Laterality Date  . APPENDECTOMY      There were no vitals filed for this visit.      Subjective Assessment - 06/06/16 0900    Subjective It feels like it is getting better. It is a little sore sometimes. Pain level today is a3/10. He has been compliant with sling use.    Pertinent History hx of L sided stroke   Limitations House hold activities   How long can you sit comfortably? unlimited   How long can you stand comfortably? 30 min   How long can you walk comfortably? 30 min   Diagnostic tests MRI 02/17/2016   Patient Stated Goals get full motion back, get strength back, decrease pain    Pain Score 3    Pain Location Shoulder   Pain Orientation Right;Left;Proximal   Pain Descriptors / Indicators Aching;Sore   Pain Type Surgical pain   Pain Onset More than a month ago   Pain Frequency Constant   Aggravating Factors  moving arm    Multiple Pain Sites No                         [facility] Adult PT Treatment/Exercise - 06/06/16 0001      Shoulder Exercises: Sidelying   Other  Sidelying Exercises protraction/ retraction using UE ranger 2 x 10 with L shoulder     Shoulder Exercises: Pulleys   Flexion 3 minutes   ABduction 3 minutes     Shoulder Exercises: Isometric Strengthening   Flexion Limitations 10 reps 5 sec   Extension Limitations 10 reps 5 sec hold   External Rotation Limitations 10 reps 5 sec   Internal Rotation Limitations 10 reps 5 sec   ABduction Limitations 10 reps 5 sec     Cryotherapy   Number Minutes Cryotherapy 10 Minutes   Cryotherapy Location Shoulder   Type of Cryotherapy Ice pack     Manual Therapy   Manual therapy comments manual trigger release on L upper trap x 3    Joint Mobilization grade 2 GH mobs inferior, posterior, and anterior 2 x 60 oscillations ea.   Passive ROM abduction with gentle distaction and with inf glides Gr 2 at end range /none pain range   Cue to relax Lt arm with ROM                PT Education - 06/06/16 1008    Education provided Yes   Education Details reviewed isometrics  Person(s) Educated Patient   Methods Explanation;Demonstration;Tactile cues;Verbal cues;Handout   Comprehension Verbalized understanding;Returned demonstration          PT Short Term Goals - 06/06/16 1325      PT SHORT TERM GOAL #1   Title independent with initial HEP (06/20/2016)   Time 4   Period Weeks   Status Achieved     PT SHORT TERM GOAL #2   Title verbalize / demo techniques to reduce L shoulder pain and inflammation via RICE and HEP (06/20/2016)   Time 4   Period Weeks   Status Achieved     PT SHORT TERM GOAL #3   Title improve L shoulder PROM flexion/ abduction to >/= 110 and ER to >/= 30 degrees to assist with improving ROM (06/20/2016)   Baseline still slightly limited in abduction has met all other movements    Time 4   Status Partially Met     PT SHORT TERM GOAL #4   Title pt will improve L grip strength to >/= 8# to demonstrate improvement in L shoulder function (06/20/2016)   Time 4   Period  Weeks   Status On-going           PT Long Term Goals - 05/20/16 0915      PT LONG TERM GOAL #1   Title independent with all advanced HEP given throughtout treatment (07/15/2016)   Time 8   Period Weeks   Status New     PT LONG TERM GOAL #2   Title increase L shoulder AROM flexion/ abduction to >/= 120 degrees and ER >/= 45 degrees with </= 2/10 pain to assist with ADLs and personal grooming and dressing (07/15/2016)   Time 8   Period Weeks   Status New     PT LONG TERM GOAL #3   Title improve L shoulder overall strength to >/=4/5 to with </= 2/10 pain to assist with ADLS and job related activities  (07/15/2016)   Time 8   Period Weeks   Status New     PT LONG TERM GOAL #4   Title pt will be able to lift >/= 8# above his head with </= 2/10 pain to assist with funcitonal lifting and carrying activities (07/15/2016)   Time 8   Period Weeks   Status New     PT LONG TERM GOAL #5   Title increase FOTO score to </= 35% limited to demonstrate improvement in function at discharge (07/15/2016)   Time 8   Period Weeks   Status New               Plan - 06/06/16 1010    Clinical Impression Statement Patients range of motion is approaching end range without pain and restriction. He tolerated isometrics well. He is making good progress.    Rehab Potential Good   PT Frequency 2x / week   PT Duration 8 weeks   PT Treatment/Interventions ADLs/Self Care Home Management;Cryotherapy;Electrical Stimulation;Iontophoresis 68m/ml Dexamethasone;Moist Heat;Therapeutic exercise;Therapeutic activities;Manual techniques;Ultrasound;Patient/family education;Passive range of motion;Taping;Vasopneumatic Device   PT Next Visit Plan Continue mobs/ROM strength within protocol, goals.    PT Home Exercise Plan isometrics   Consulted and Agree with Plan of Care Patient      Patient will benefit from skilled therapeutic intervention in order to improve the following deficits and impairments:  Pain,  Improper body mechanics, Postural dysfunction, Decreased endurance, Decreased activity tolerance, Decreased range of motion, Hypomobility, Decreased strength, Decreased scar mobility, Increased fascial restricitons, Impaired UE functional  use, Impaired flexibility, Increased edema  Visit Diagnosis: Pain in left shoulder  Stiffness of left shoulder, not elsewhere classified  Abnormal posture  Muscle weakness (generalized)     Problem List Patient Active Problem List   Diagnosis Date Noted  . De Quervain's tenosynovitis, right 10/19/2015  . 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 Fracture of scaphoid of right wrist with delayed healing 09/03/2015  . Diabetic peripheral neuropathy associated with type 2 diabetes mellitus ([place]) 05/22/2015  . Essential hypertension, benign 05/21/2015  . Erectile dysfunction 10/02/2013  . Hypogonadism male 10/02/2013  . Insulin dependent type 2 diabetes mellitus, uncontrolled ([place]) 05/23/2012  . Hyperlipidemia 05/23/2012  . Obesity, Class II, BMI 35-39.9, with comorbidity ([place]) 05/23/2012  . Depression 05/23/2012  . Tobacco user 05/23/2012  . History of stroke without residual deficits 05/23/2012    Carney Living PT DPT  06/06/2016, 1:27 PM  [facility] [address] [place], [place], [address] Phone: [phone]   Fax:  [phone]  Name: Drew Salinas MRN: [id number] Date of Birth: 1951-07-18

## 2016-06-07 ENCOUNTER — Encounter: Payer: PRIVATE HEALTH INSURANCE | Admitting: Physical Therapy

## 2016-06-09 ENCOUNTER — Ambulatory Visit: Payer: PRIVATE HEALTH INSURANCE | Admitting: Physical Therapy

## 2016-06-09 ENCOUNTER — Other Ambulatory Visit: Payer: Self-pay | Admitting: Family Medicine

## 2016-06-09 DIAGNOSIS — R293 Abnormal posture: Secondary | ICD-10-CM

## 2016-06-09 DIAGNOSIS — M25512 Pain in left shoulder: Secondary | ICD-10-CM

## 2016-06-09 DIAGNOSIS — M25612 Stiffness of left shoulder, not elsewhere classified: Secondary | ICD-10-CM

## 2016-06-09 DIAGNOSIS — M6281 Muscle weakness (generalized): Secondary | ICD-10-CM

## 2016-06-09 NOTE — Therapy (Signed)
[place], [place], [address] Phone: [phone]   Fax:  [phone]  Physical Therapy Treatment  Patient Details  Name: Drew Salinas MRN: [id number] Date of Birth: 1951/06/27 Referring Provider: Joni Fears MD  Encounter Date: 06/09/2016      PT End of Session - 06/09/16 0935    Visit Number 7   Number of Visits 17   Date for PT Re-Evaluation 07/15/16   PT Start Time 0848   PT Stop Time 0938   PT Time Calculation (min) 50 min   Activity Tolerance Patient tolerated treatment well   Behavior During Therapy [facility] for tasks assessed/performed      Past Medical History:  Diagnosis Date  . Allergy   . Diabetes mellitus 1995  . GERD (gastroesophageal reflux disease)   . Hyperlipidemia   . Hypertension   . Sleep apnea    per patients wife  . Stroke [facility]) 1993    Past Surgical History:  Procedure Laterality Date  . APPENDECTOMY      There were no vitals filed for this visit.      Subjective Assessment - 06/09/16 0854    Subjective "I noticed I have alittle stinging pain inthe top of my shoulder today but other than that I am doing okay"    Currently in Pain? Yes   Pain Score 3    Pain Location Shoulder   Pain Orientation Right;Left   Pain Descriptors / Indicators Aching;Sore   Pain Type Surgical pain   Pain Onset More than a month ago   Pain Frequency Intermittent   Aggravating Factors  moving the arm   Pain Relieving Factors meds/ rest, Ice            [facility] PT Assessment - 06/09/16 0910      Observation/Other Assessments   Focus on Therapeutic Outcomes (FOTO)  47% limited     PROM   Left Shoulder Flexion 145 Degrees  in scaption   Left Shoulder ABduction 98 Degrees  in supine     Strength   Left Hand Grip (lbs) 67.3  17,79,39                     [facility] Adult PT Treatment/Exercise - 06/09/16 0855      Shoulder Exercises: Pulleys   Flexion 3 minutes   ABduction 3 minutes     Cryotherapy   Number Minutes Cryotherapy 10 Minutes   Cryotherapy Location Shoulder   Type of Cryotherapy Ice pack     Manual Therapy   Manual Therapy Soft tissue mobilization;Scapular mobilization   Joint Mobilization grade 2 GH mobs inferior, posterior, and anterior 2 x 60 oscillations ea.   Soft tissue mobilization IASTM along anterior/ middle deltoid region/ ASTYM along incision to with cross friction massage  reported no stinging pain following   Scapular Mobilization grade 3 in all directions including upward/ downward rotation   Passive ROM abduction with gentle distaction and with inf glides Gr 2 at end range /none pain range   Cue to relax Lt arm with ROM                  PT Short Term Goals - 06/09/16 0903      PT SHORT TERM GOAL #1   Title independent with initial HEP (06/20/2016)   Time 4   Period Weeks   Status Achieved     PT SHORT TERM GOAL #2   Title verbalize / demo  techniques to reduce L shoulder pain and inflammation via RICE and HEP (06/20/2016)   Time 4   Period Weeks   Status Achieved     PT SHORT TERM GOAL #3   Title improve L shoulder PROM flexion/ abduction to >/= 110 and ER to >/= 30 degrees to assist with improving ROM (06/20/2016)   Time 4   Period Weeks   Status Partially Met     PT SHORT TERM GOAL #4   Title pt will improve L grip strength to >/= 8# to demonstrate improvement in L shoulder function (06/20/2016)   Baseline increased to 67.3#   Time 4   Period Weeks   Status Achieved           PT Long Term Goals - 06/09/16 6073      PT LONG TERM GOAL #1   Title independent with all advanced HEP given throughtout treatment (07/15/2016)   Time 8   Period Weeks   Status On-going     PT LONG TERM GOAL #2   Time 8   Period Weeks   Status On-going     PT LONG TERM GOAL #3   Title improve L shoulder overall strength to >/=4/5 to with </= 2/10 pain to assist with ADLS and job related activities   (07/15/2016)   Time 8   Period Weeks   Status On-going     PT LONG TERM GOAL #4   Title pt will be able to lift >/= 8# above his head with </= 2/10 pain to assist with funcitonal lifting and carrying activities (07/15/2016)   Time 8   Period Weeks   Status On-going     PT LONG TERM GOAL #5   Title increase FOTO score to </= 35% limited to demonstrate improvement in function at discharge (07/15/2016)   Time 8   Period Weeks   Status On-going               Plan - 06/09/16 0936    Clinical Impression Statement Mr. Kanady reports mild stinging in the lateral aspect of the shoulder today rated at 3/10. focused todays session on manual to improve GH and scapular mobility, and Soft tissue work over lateral deltoid. post session pt reported 0/10 pain. pt continues to improve with PT increasing PROM and reduce pain.    PT Next Visit Plan Continue mobs/ROM strength within protocol, goals.    Consulted and Agree with Plan of Care Patient      Patient will benefit from skilled therapeutic intervention in order to improve the following deficits and impairments:  Pain, Improper body mechanics, Postural dysfunction, Decreased endurance, Decreased activity tolerance, Decreased range of motion, Hypomobility, Decreased strength, Decreased scar mobility, Increased fascial restricitons, Impaired UE functional use, Impaired flexibility, Increased edema  Visit Diagnosis: Pain in left shoulder  Stiffness of left shoulder, not elsewhere classified  Abnormal posture  Muscle weakness (generalized)     Problem List Patient Active Problem List   Diagnosis Date Noted  . De Quervain's tenosynovitis, right 10/19/2015  . 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 Fracture of scaphoid of right wrist with delayed healing 09/03/2015  . Diabetic peripheral neuropathy associated with type 2 diabetes mellitus ([place]) 05/22/2015  . Essential hypertension, benign  05/21/2015  . Erectile dysfunction 10/02/2013  . Hypogonadism male 10/02/2013  . Insulin dependent type 2 diabetes mellitus, uncontrolled ([place]) 05/23/2012  . Hyperlipidemia 05/23/2012  . Obesity, Class II, BMI 35-39.9, with comorbidity (  [place]) 05/23/2012  . Depression 05/23/2012  . Tobacco user 05/23/2012  . History of stroke without residual deficits 05/23/2012   Starr Lake PT, DPT, LAT, ATC  06/09/16  9:39 AM      [facility] [address] [place], [place], [address] Phone: [phone]   Fax:  [phone]  Name: Tarin Navarez MRN: [id number] Date of Birth: 1951-07-15

## 2016-06-13 ENCOUNTER — Other Ambulatory Visit (INDEPENDENT_AMBULATORY_CARE_PROVIDER_SITE_OTHER): Payer: PRIVATE HEALTH INSURANCE

## 2016-06-13 DIAGNOSIS — E291 Testicular hypofunction: Secondary | ICD-10-CM | POA: Diagnosis not present

## 2016-06-13 DIAGNOSIS — E1165 Type 2 diabetes mellitus with hyperglycemia: Secondary | ICD-10-CM

## 2016-06-13 DIAGNOSIS — Z794 Long term (current) use of insulin: Secondary | ICD-10-CM

## 2016-06-13 LAB — COMPREHENSIVE METABOLIC PANEL
ALK PHOS: 75 U/L (ref 39–117)
ALT: 26 U/L (ref 0–53)
AST: 24 U/L (ref 0–37)
Albumin: 4.4 g/dL (ref 3.5–5.2)
BILIRUBIN TOTAL: 0.5 mg/dL (ref 0.2–1.2)
BUN: 20 mg/dL (ref 6–23)
CALCIUM: 9.8 mg/dL (ref 8.4–10.5)
CO2: 28 meq/L (ref 19–32)
CREATININE: 1.23 mg/dL (ref 0.40–1.50)
Chloride: 101 mEq/L (ref 96–112)
GFR: 62.84 mL/min (ref >60.00)
GLUCOSE: 156 mg/dL — AB (ref 70–99)
Potassium: 4.2 mEq/L (ref 3.5–5.1)
Sodium: 139 mEq/L (ref 135–145)
TOTAL PROTEIN: 7.5 g/dL (ref 6.0–8.3)

## 2016-06-13 LAB — HEMOGLOBIN A1C: Hgb A1c MFr Bld: 6.1 % (ref 4.6–6.5)

## 2016-06-13 LAB — LUTEINIZING HORMONE: LH: 14.68 m[IU]/mL — ABNORMAL HIGH (ref 1.50–9.30)

## 2016-06-14 ENCOUNTER — Ambulatory Visit: Payer: PRIVATE HEALTH INSURANCE | Admitting: Physical Therapy

## 2016-06-14 ENCOUNTER — Other Ambulatory Visit: Payer: Self-pay | Admitting: Family Medicine

## 2016-06-14 DIAGNOSIS — R29898 Other symptoms and signs involving the musculoskeletal system: Secondary | ICD-10-CM

## 2016-06-14 DIAGNOSIS — R293 Abnormal posture: Secondary | ICD-10-CM

## 2016-06-14 DIAGNOSIS — M6281 Muscle weakness (generalized): Secondary | ICD-10-CM

## 2016-06-14 DIAGNOSIS — M25612 Stiffness of left shoulder, not elsewhere classified: Secondary | ICD-10-CM

## 2016-06-14 DIAGNOSIS — M25512 Pain in left shoulder: Secondary | ICD-10-CM | POA: Diagnosis not present

## 2016-06-14 LAB — TESTOSTERONE, FREE, TOTAL, SHBG
SEX HORMONE BINDING: 70.9 nmol/L (ref 19.3–76.4)
TESTOSTERONE FREE: 4.1 pg/mL — AB (ref 6.6–18.1)
TESTOSTERONE: 243 ng/dL — AB (ref 264–916)

## 2016-06-14 NOTE — Therapy (Signed)
[facility] [address] [place], [place], [address] Phone: [phone]   Fax:  [phone]  Physical Therapy Treatment  Patient Details  Name: Drew RetortLarry Salinas MRN: [id number] Date of Birth: 08/28/1951 Referring Provider: Norlene CampbellPeter Whitfield MD  Encounter Date: 06/14/2016      PT End of Session - 06/14/16 1817    Visit Number 7   Number of Visits 17   Date for PT Re-Evaluation 07/15/16   PT Start Time 0804   PT Stop Time 0855   PT Time Calculation (min) 51 min   Activity Tolerance Patient tolerated treatment well   Behavior During Therapy [facility] for tasks assessed/performed      Past Medical History:  Diagnosis Date  . Allergy   . Diabetes mellitus 1995  . GERD (gastroesophageal reflux disease)   . Hyperlipidemia   . Hypertension   . Sleep apnea    per patients wife  . Stroke [facility](HCC) 1993    Past Surgical History:  Procedure Laterality Date  . APPENDECTOMY      There were no vitals filed for this visit.      Subjective Assessment - 06/14/16 0802    Subjective 1/10 pain.  Doing his home exercises.   Has been using Advil for pain for about 3 weeks. Saw Md he suggested I get pulleys at home.  I use my right hand mostly at work to type, resting my left on a pillow on the arm rest.  Md was happy with progress.  He goes back in 3 weeks.   MD said I could take sling of if I was sitting down at home.     Currently in Pain? Yes   Pain Score 1    Pain Location Shoulder   Pain Orientation Left;Anterior   Pain Descriptors / Indicators Dull   Pain Frequency Intermittent   Aggravating Factors  sleeping on shoulder   Pain Relieving Factors Sling, Advil            [facility] PT Assessment - 06/14/16 0001      PROM   Left Shoulder Flexion 136 Degrees   Left Shoulder ABduction 90 Degrees  stiff                     [facility] Adult PT Treatment/Exercise - 06/14/16 0808      Exercises   Exercises Wrist;Hand  towel  squeeze,10, 3 pounds wrist 3 way resting forearm     Shoulder Exercises: Standing   Other Standing Exercises UE ranger at wall, tandem with scapular assist By PTA,  2 different heights as ROM increased.  Mildly uncomfortable initially     Shoulder Exercises: Isometric Strengthening   Flexion --  10 X5   Extension Limitations 10 x5   Internal Rotation --  10 X 5,  2/10 pain   ABduction Limitations 10 X5   ADduction Limitations 10 X5     Wrist Exercises   Wrist Flexion Left;10 reps  3 pounds, resting on thigh   Wrist Extension Left;10 reps   Wrist Radial Deviation 10 reps;Left  3 pounds , arm resting on thigh     Cryotherapy   Number Minutes Cryotherapy 10 Minutes   Cryotherapy Location Shoulder   Type of Cryotherapy --  cold pack     Manual Therapy   Joint Mobilization inferior GH mobs inferior and posterior   Soft tissue mobilization retrograde soft tissue Left.   scar tissue mobilization   Passive ROM Abduction  and  inferior GH mobs,  tight able to get to 90 degrees                  PT Short Term Goals - 06/14/16 1820      PT SHORT TERM GOAL #1   Title independent with initial HEP (06/20/2016)   Time 4   Period Weeks   Status Achieved     PT SHORT TERM GOAL #2   Title verbalize / demo techniques to reduce L shoulder pain and inflammation via RICE and HEP (06/20/2016)   Time 4   Period Weeks   Status Achieved     PT SHORT TERM GOAL #3   Title improve L shoulder PROM flexion/ abduction to >/= 110 and ER to >/= 30 degrees to assist with improving ROM (06/20/2016)   Time 4   Period Weeks   Status On-going     PT SHORT TERM GOAL #4   Title pt will improve L grip strength to >/= 8# to demonstrate improvement in L shoulder function (06/20/2016)   Time 4   Period Weeks   Status Achieved           PT Long Term Goals - 06/09/16 1610      PT LONG TERM GOAL #1   Title independent with all advanced HEP given throughtout treatment (07/15/2016)   Time 8    Period Weeks   Status On-going     PT LONG TERM GOAL #2   Time 8   Period Weeks   Status On-going     PT LONG TERM GOAL #3   Title improve L shoulder overall strength to >/=4/5 to with </= 2/10 pain to assist with ADLS and job related activities  (07/15/2016)   Time 8   Period Weeks   Status On-going     PT LONG TERM GOAL #4   Title pt will be able to lift >/= 8# above his head with </= 2/10 pain to assist with funcitonal lifting and carrying activities (07/15/2016)   Time 8   Period Weeks   Status On-going     PT LONG TERM GOAL #5   Title increase FOTO score to </= 35% limited to demonstrate improvement in function at discharge (07/15/2016)   Time 8   Period Weeks   Status On-going               Plan - 06/14/16 1818    Clinical Impression Statement Following protocol shoulder stiffer in abduction today. 4/10 pain prior to cold pack.   PT Next Visit Plan Continue mobs/ROM strength within protocol, goals. issue wall ladder exercises per patient, MD said to do   PT Home Exercise Plan continue   Consulted and Agree with Plan of Care Patient      Patient will benefit from skilled therapeutic intervention in order to improve the following deficits and impairments:  Pain, Improper body mechanics, Postural dysfunction, Decreased endurance, Decreased activity tolerance, Decreased range of motion, Hypomobility, Decreased strength, Decreased scar mobility, Increased fascial restricitons, Impaired UE functional use, Impaired flexibility, Increased edema  Visit Diagnosis: Pain in left shoulder  Decreased ROM of left shoulder  Stiffness of left shoulder, not elsewhere classified  Abnormal posture  Muscle weakness (generalized)  Left shoulder pain  Shoulder weakness     Problem List Patient Active Problem List   Diagnosis Date Noted  . De Quervain's tenosynovitis, right 10/19/2015  . 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  Fracture of scaphoid of right wrist with delayed healing 09/03/2015  . Diabetic peripheral neuropathy associated with type 2 diabetes mellitus (HCC) 05/22/2015  . Essential hypertension, benign 05/21/2015  . Erectile dysfunction 10/02/2013  . Hypogonadism male 10/02/2013  . Insulin dependent type 2 diabetes mellitus, uncontrolled (HCC) 05/23/2012  . Hyperlipidemia 05/23/2012  . Obesity, Class II, BMI 35-39.9, with comorbidity (HCC) 05/23/2012  . Depression 05/23/2012  . Tobacco user 05/23/2012  . History of stroke without residual deficits 05/23/2012    [facility] CenterARRIS,KAREN 06/14/2016, 6:21 PM  [facility] [address] [place], [place], [address] Phone: [phone]   Fax:  [phone]  Name: Drew RetortLarry Salinas MRN: [id number] Date of Birth: 11/11/1950   Liz BeachKaren Harris, PTA 06/14/16 6:21 PM Phone: [phone] Fax: [phone]

## 2016-06-16 ENCOUNTER — Encounter: Payer: Self-pay | Admitting: Endocrinology

## 2016-06-16 ENCOUNTER — Ambulatory Visit (INDEPENDENT_AMBULATORY_CARE_PROVIDER_SITE_OTHER): Payer: PRIVATE HEALTH INSURANCE | Admitting: Endocrinology

## 2016-06-16 VITALS — BP 140/68 | HR 71 | Ht 71.0 in | Wt 245.0 lb

## 2016-06-16 DIAGNOSIS — Z794 Long term (current) use of insulin: Secondary | ICD-10-CM | POA: Diagnosis not present

## 2016-06-16 DIAGNOSIS — E291 Testicular hypofunction: Secondary | ICD-10-CM | POA: Diagnosis not present

## 2016-06-16 DIAGNOSIS — E1165 Type 2 diabetes mellitus with hyperglycemia: Secondary | ICD-10-CM

## 2016-06-16 NOTE — Progress Notes (Signed)
Patient ID: Drew Salinas, male   DOB: 03/12/1951, 65 y.o.   MRN: [id number]   Chief complaint:  followup    History of Present Illness:  DIABETES:  He was told to have diabetes several years ago and has been on insulin since about 2006 [place] taking large doses of insulin with Levemir 100 units twice a day along with Novolog 30-36 units before meals.  He prefers to take Levemir with a syringe because of needing large doses He has also been taking Janumet for a few years His A1c had been typically consistently high with this regimen  RECENT history:   INSULIN doses: TRESIBA 160 units day, Novolog 25-30 before meals  He had been switched from Levemir to [facility] in [facility]16 Also was started on Jardiance 10 mg daily and Janumet continued. He has had improved control with this and now A1c is down to 6.1  His Evaristo Buryresiba was changed to once a day on the last visit  Current blood sugar patterns and problems identified:  He has had relatively normal blood sugars in the mornings including one reading of 56 which is asymptomatic  Has a few readings after breakfast which are similar but his lab glucose was 155 soon after eating cereal and banana  Has only 2 readings before supper 1 of them high  No readings after supper  He does not exercise, recently getting over shoulder surgery  Although he does not think his diet has changed he has lost some weight  MEALTIME insulin: He is taking 25 or 30 units-not clear how he is adjusting the dose  Mean values apply above for all meters except median for One Touch  PRE-MEAL Fasting Lunch Dinner Bedtime Overall  Glucose range: 56-106  1 11-290    Mean/median:  98    103   POST-MEAL PC Breakfast PC Lunch PC Dinner  Glucose range: 76-128    Mean/median:        Exercise:   less recently, no formal exercise He has not had a consultation with dietitian in several years Last CDE consultation: 06/2015  Wt Readings from  Last 3 Encounters:  06/16/16 245 lb (111.1 kg)  04/14/16 257 lb (116.6 kg)  03/31/16 254 lb (115.2 kg)     Lab Results  Component Value Date   HGBA1C 6.1 06/13/2016   HGBA1C 7.2 (H) 03/02/2016   HGBA1C 6.6 12/07/2015   Lab Results  Component Value Date   MICROALBUR 0.8 05/22/2015   LDLCALC 33 10/01/2015   CREATININE 1.23 06/13/2016    HYPOGONADISM: He was seen in consultation in 9/14 for evaluation of a low testosterone level. This was initially tested because of erectile dysfunction At that time his libido had been fairly good and also did not complain of any unusual fatigue, poor muscle endurance,  lack of motivation or depression Evaluation confirmed a low free testosterone level as well as normal prolactin. LH level was mildly increased  Because of documented hypogonadism he was started on Androderm 4 mg patch  However because of skin irritation with the patch and inconsistent adherence he was changed to AndroGel Apparently in 2015 this was changed to [place] by his PCP   Testosterone levels: Baseline 151, after starting AndroGel, 420 in 12/14  Recent history: Subjectively with testosterone supplementation he previously had more energy level and mood was improved. With AndroGel he was using 3 pumps daily but this is not covered by insurance With using Axiron his testosterone levels were  consistently low even with using the correct application technique  He is taking clomiphene and is taking half tablet daily. He was not able to get the medication for 3 weeks after his last visit and has taken it mostly for the last month  He thinks that with taking this his energy level his overall fairly good However his testosterone level is still  low, LH is relatively high again   Lab Results  Component Value Date   TESTOSTERONE 243 (L) 06/13/2016        Medication List       Accurate as of 06/16/16  9:30 AM. Always use your most recent med list.            amLODipine 5 MG tablet Commonly known as:  NORVASC TAKE 1 TABLET (5 MG TOTAL) BY MOUTH DAILY.   atorvastatin 10 MG tablet Commonly known as:  LIPITOR TAKE 1 TABLET (10 MG TOTAL) BY MOUTH DAILY AT 6 PM.   AXIRON 30 MG/ACT Soln Generic drug:  Testosterone APPLY 1 PUMP UNDER EACH ARM DAILY   buPROPion 300 MG 24 hr tablet Commonly known as:  WELLBUTRIN XL Take 1 tablet (300 mg total) by mouth daily.   clomiPHENE 50 MG tablet Commonly known as:  CLOMID 1/2 qd   clopidogrel 75 MG tablet Commonly known as:  PLAVIX TAKE 1 TABLET (75 MG TOTAL) BY MOUTH DAILY.   clotrimazole 1 % cream Commonly known as:  LOTRIMIN Apply 1 application topically 2 (two) times daily. To feet x 2 wks   ibuprofen 200 MG tablet Commonly known as:  ADVIL,MOTRIN Take 400 mg by mouth every 6 (six) hours as needed (2-3 times day).   Insulin Pen Needle 32G X 4 MM Misc Use 3 per day to inject insulin   Insulin Syringe-Needle U-100 31G X 5/16" 1 ML Misc Commonly known as:  B-D INS SYR ULTRAFINE 1CC/31G Use as directed 4-6 times daily.   JANUMET 50-1000 MG tablet Generic drug:  sitaGLIPtin-metformin Take 1 tablet by mouth 2 (two) times daily.   JARDIANCE 10 MG Tabs tablet Generic drug:  empagliflozin TAKE 1 TABLET BY MOUTH EVERY DAY   methocarbamol 500 MG tablet Commonly known as:  ROBAXIN Take 500 mg by mouth every 6 (six) hours as needed for muscle spasms.   nitroGLYCERIN 0.2 mg/hr patch Commonly known as:  NITRODUR - Dosed in mg/24 hr 1/4 patch daily   NOVOLOG 100 UNIT/ML injection Generic drug:  insulin aspart INJECT 30-40 UNITS INTO THE SKIN 3 TIMES A DAY WITH MEALS BY SLIDING SCALE   ONE TOUCH ULTRA TEST test strip Generic drug:  glucose blood USE AS INSTRUCTED TO CHECK BLOOD SUGAR 3 TIMES DAILY   oxyCODONE-acetaminophen 5-325 MG tablet Commonly known as:  PERCOCET/ROXICET Take 1-2 tablets by mouth every 4 (four) hours as needed for severe pain.   tadalafil 20 MG tablet Commonly  known as:  CIALIS Take 1 tablet (20 mg total) by mouth daily as needed for erectile dysfunction.   traMADol 50 MG tablet Commonly known as:  ULTRAM Take 1 tablet (50 mg total) by mouth every 12 (twelve) hours as needed.   traMADol 50 MG tablet Commonly known as:  ULTRAM Take 1 tablet (50 mg total) by mouth every 8 (eight) hours as needed.   traZODone 100 MG tablet Commonly known as:  DESYREL TAKE 1 TABLET BY MOUTH AT BEDTIME   TRESIBA FLEXTOUCH 200 UNIT/ML Sopn Generic drug:  Insulin Degludec INJECT 100 UNITS INTO THE SKIN 2 (TWO)  TIMES DAILY.   Vitamin D (Ergocalciferol) 50000 units Caps capsule Commonly known as:  DRISDOL Take 1 capsule (50,000 Units total) by mouth every 7 (seven) days.       Allergies:  Allergies  Allergen Reactions  . Lisinopril Cough    Past Medical History:  Diagnosis Date  . Allergy   . Diabetes mellitus 1995  . GERD (gastroesophageal reflux disease)   . Hyperlipidemia   . Hypertension   . Sleep apnea    per patients wife  . Stroke [facility]) 1993    Past Surgical History:  Procedure Laterality Date  . APPENDECTOMY      Family History  Problem Relation Age of Onset  . Diabetes Mother   . Diabetes Brother     Social History:  reports that he has been smoking Cigarettes.  He has a 22.50 pack-year smoking history. He has never used smokeless tobacco. He reports that he drinks about 1.2 oz of alcohol per week . He reports that he does not use drugs.  REVIEW of systems:    He has history of hyperlipidemia treated with Lipitor  Lab Results  Component Value Date   CHOL 107 (L) 10/01/2015   HDL 46 10/01/2015   LDLCALC 33 10/01/2015   TRIG 141 10/01/2015   CHOLHDL 2.3 10/01/2015    History of hypertension treated with amlodipine   ERECTILE dysfunction: Has been prescribed Cialis, Concerned about the cost  Taking Plavix for history of stroke with no residual deficit  LABS:   Lab on 06/13/2016  Component Date Value Ref Range  Status  . Sodium 06/13/2016 139  135 - 145 mEq/L Final  . Potassium 06/13/2016 4.2  3.5 - 5.1 mEq/L Final  . Chloride 06/13/2016 101  96 - 112 mEq/L Final  . CO2 06/13/2016 28  19 - 32 mEq/L Final  . Glucose, Bld 06/13/2016 156* 70 - 99 mg/dL Final  . BUN 16/08/9603 20  6 - 23 mg/dL Final  . Creatinine, Ser 06/13/2016 1.23  0.40 - 1.50 mg/dL Final  . Total Bilirubin 06/13/2016 0.5  0.2 - 1.2 mg/dL Final  . Alkaline Phosphatase 06/13/2016 75  39 - 117 U/L Final  . AST 06/13/2016 24  0 - 37 U/L Final  . ALT 06/13/2016 26  0 - 53 U/L Final  . Total Protein 06/13/2016 7.5  6.0 - 8.3 g/dL Final  . Albumin 54/07/8118 4.4  3.5 - 5.2 g/dL Final  . Calcium 14/78/2956 9.8  8.4 - 10.5 mg/dL Final  . GFR 21/30/8657 62.84  >60.00 mL/min Final  . Hgb A1c MFr Bld 06/13/2016 6.1  4.6 - 6.5 % Final  . LH 06/13/2016 14.68* 1.50 - 9.30 mIU/mL Final   Comment: Male Reference Range:20-70 yrs     1.5-9.3 mIU/mL>70 yrs       3.1-35.6 mIU/mLFemale Reference Range:Follicular Phase     1.9-12.5 mIU/mLMidcycle             8.7-76.3 mIU/mLLuteal Phase         0.5-16.9 mIU/mL  Post Menopausal      15.9-54.0  mIU/mLPregnant             <1.5 mIU/mLContraceptives       0.7-5.6 mIU/mL   . Testosterone 06/14/2016 243* 264 - 916 ng/dL Final   Comment: Adult male reference interval is based on a population of healthy nonobese males (BMI <30) between 63 and 84 years old. Travison, et.al. JCEM [phone]. PMID: [id number].   Marland Kitchen Testosterone, Free 06/14/2016 4.1* 6.6 -  18.1 pg/mL Final  . Sex Hormone Binding 06/14/2016 70.9  19.3 - 76.4 nmol/L Final     General Examination:   BP 140/68   Pulse 71   Ht 5\' 11"  (1.803 m)   Wt 245 lb (111.1 kg)   BMI 34.17 kg/m    Assessment/ Plan:  DIABETES type II with obesity: See history of present illness for detailed discussion of current diabetes management, blood sugar patterns and problems identified  His blood sugars are excellent with A1c 6.1 He may be getting  excessive amount of basal insulin as fasting readings are low normal including a reading of 56 Has sporadic high readings, once was 290 at suppertime but no readings after evening meal Currently getting somewhat high carbohydrate breakfast with cereal and banana  His Evaristo Bury will be continued at once a day regimen but reduce it by 10 units down to 150 He will also need to use 5 units less on his breakfast coverage; advised him to have a protein instead of fruit with his cereal He does need to check readings after supper and discussed blood sugar targets after meals Encouraged him to start walking when able to   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,  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 He has been  symptomatic with low levels of testosterone  Since he was not able to get adequate testosterone levels or subjective improvement with Axiron and AndroGel is not covered he is now taking clomiphene He thinks he is subjectively doing better with this  However his testosterone level is still low  Since he his subjectively feeling better he can try using the clomiphene for a longer period of time before trying to get AndroGel again with prior authorization Again Will need to recheck his LH level which is relatively high  Patient Instructions  Tresiba 150 daily  Reduce am Novolog by 5  Check blood sugars on waking up  3x per week  Also check blood sugars about 2 hours after a meal and do this after different meals by rotation  Recommended blood sugar levels on waking up is 90-130 and about 2 hours after meal is 130-160  Please bring your blood sugar monitor to each visit, thank you        Counseling time on subjects discussed above is over 50% of today's 25 minute visit      [employer]  06/16/2016 9:30 AM  Note: This office note was prepared with Insurance underwriter. Any transcriptional errors that result  from this process are unintentional.

## 2016-06-16 NOTE — Patient Instructions (Addendum)
Tresiba 150 daily  Reduce am Novolog by 5  Check blood sugars on waking up  3x per week  Also check blood sugars about 2 hours after a meal and do this after different meals by rotation  Recommended blood sugar levels on waking up is 90-130 and about 2 hours after meal is 130-160  Please bring your blood sugar monitor to each visit, thank you

## 2016-06-17 ENCOUNTER — Ambulatory Visit: Payer: PRIVATE HEALTH INSURANCE | Admitting: Physical Therapy

## 2016-06-17 DIAGNOSIS — M25612 Stiffness of left shoulder, not elsewhere classified: Secondary | ICD-10-CM

## 2016-06-17 DIAGNOSIS — R293 Abnormal posture: Secondary | ICD-10-CM

## 2016-06-17 DIAGNOSIS — M6281 Muscle weakness (generalized): Secondary | ICD-10-CM

## 2016-06-17 DIAGNOSIS — M25512 Pain in left shoulder: Secondary | ICD-10-CM | POA: Diagnosis not present

## 2016-06-17 NOTE — Therapy (Signed)
[facility] [address] [place], [place], [address] Phone: [phone]   Fax:  [phone]  Physical Therapy Treatment  Patient Details  Name: Drew Salinas MRN: [id number] Date of Birth: 02/29/1951 Referring Provider: Norlene CampbellPeter Whitfield MD  Encounter Date: 06/17/2016      PT End of Session - 06/17/16 0832    Visit Number 9   Number of Visits 17   Date for PT Re-Evaluation 07/15/16   PT Start Time 0800   PT Stop Time 0852   PT Time Calculation (min) 52 min   Activity Tolerance Patient tolerated treatment well   Behavior During Therapy [facility] for tasks assessed/performed      Past Medical History:  Diagnosis Date  . Allergy   . Diabetes mellitus 1995  . GERD (gastroesophageal reflux disease)   . Hyperlipidemia   . Hypertension   . Sleep apnea    per patients wife  . Stroke [facility](HCC) 1993    Past Surgical History:  Procedure Laterality Date  . APPENDECTOMY      There were no vitals filed for this visit.      Subjective Assessment - 06/17/16 0759    Subjective " alittle achey today, but overal not bad with the shoulder"    Currently in Pain? Yes   Pain Score 2    Pain Orientation Left   Pain Descriptors / Indicators Aching   Pain Onset More than a month ago                         [facility]/Exercise - 06/17/16 0807      Shoulder Exercises: Supine   Protraction AROM;Strengthening;5 reps;10 reps  2 sets using dowel rod   Other Supine Exercises dowel flexion 2 x 10 to end range   pt reported no pain, and surprised he went as far as he did     Shoulder Exercises: Standing   External Rotation AROM;Strengthening;Left;10 reps;Theraband  1 set   Theraband Level (Shoulder External Rotation) Level 1 (Yellow)   Internal Rotation AROM;Strengthening;Left;10 reps;Theraband  1 set   Theraband Level (Shoulder Internal Rotation) Level 1 (Yellow)   Row AROM;Strengthening;Both;10 reps;Theraband   1 set   Theraband Level (Shoulder Row) Level 2 (Red)     Shoulder Exercises: Pulleys   Flexion 2 minutes   ABduction 2 minutes     Shoulder Exercises: ROM/Strengthening   UBE (Upper Arm Bike) Nu-Step L3 x 5 min  UE/LE verbal cues to go slowly     Cryotherapy   Number Minutes Cryotherapy 10 Minutes   Cryotherapy Location Shoulder   Type of Cryotherapy Ice pack     Manual Therapy   Manual therapy comments manual trigger point release along the distal middle deltoid x 3                PT Education - 06/17/16 0831    Education provided Yes   Education Details updated HEP with proper from   Starwood HotelsPerson(s) Educated Patient   Methods Explanation   Comprehension Verbalized understanding          PT Short Term Goals - 06/14/16 1820      PT SHORT TERM GOAL #1   Title independent with initial HEP (06/20/2016)   Time 4   Period Weeks   Status Achieved     PT SHORT TERM GOAL #2   Title verbalize / demo techniques to reduce L shoulder pain and inflammation via RICE and  HEP (06/20/2016)   Time 4   Period Weeks   Status Achieved     PT SHORT TERM GOAL #3   Title improve L shoulder PROM flexion/ abduction to >/= 110 and ER to >/= 30 degrees to assist with improving ROM (06/20/2016)   Time 4   Period Weeks   Status On-going     PT SHORT TERM GOAL #4   Title pt will improve L grip strength to >/= 8# to demonstrate improvement in L shoulder function (06/20/2016)   Time 4   Period Weeks   Status Achieved           PT Long Term Goals - 06/09/16 [id number]      PT LONG TERM GOAL #1   Title independent with all advanced HEP given throughtout treatment (07/15/2016)   Time 8   Period Weeks   Status On-going     PT LONG TERM GOAL #2   Time 8   Period Weeks   Status On-going     PT LONG TERM GOAL #3   Title improve L shoulder overall strength to >/=4/5 to with </= 2/10 pain to assist with ADLS and job related activities  (07/15/2016)   Time 8   Period Weeks   Status  On-going     PT LONG TERM GOAL #4   Title pt will be able to lift >/= 8# above his head with </= 2/10 pain to assist with funcitonal lifting and carrying activities (07/15/2016)   Time 8   Period Weeks   Status On-going     PT LONG TERM GOAL #5   Title increase FOTO score to </= 35% limited to demonstrate improvement in function at discharge (07/15/2016)   Time 8   Period Weeks   Status On-going               Plan - 06/17/16 0848    Clinical Impression Statement [name] continues to make progress with phyiscal therapy improving shoulder mobility with decrease pain. began progressing to active strengthening with ER/ IR and rows montioring pt for pain. He did well with no report of increased pain today. continued ice post session for soreness.    PT Next Visit Plan Continue mobs/ROM strength within protocol, goals. issue wall ladder exercises per patient, if pt tolerated isotonics add yellow theraband Ir/ER and rows to HEP   PT Home Exercise Plan ceiling punches, dowel flexion   Consulted and Agree with Plan of Care Patient      Patient will benefit from skilled therapeutic intervention in order to improve the following deficits and impairments:  Pain, Improper body mechanics, Postural dysfunction, Decreased endurance, Decreased activity tolerance, Decreased range of motion, Hypomobility, Decreased strength, Decreased scar mobility, Increased fascial restricitons, Impaired UE functional use, Impaired flexibility, Increased edema  Visit Diagnosis: Pain in left shoulder  Decreased ROM of left shoulder  Stiffness of left shoulder, not elsewhere classified  Abnormal posture  Muscle weakness (generalized)     Problem List Patient Active Problem List   Diagnosis Date Noted  . De Quervain's tenosynovitis, right 10/19/2015  . 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 Fracture of scaphoid of right wrist with delayed healing  09/03/2015  . Diabetic peripheral neuropathy associated with type 2 diabetes mellitus (HCC) 05/22/2015  . Essential hypertension, benign 05/21/2015  . Erectile dysfunction 10/02/2013  . Hypogonadism male 10/02/2013  . Insulin dependent type 2 diabetes mellitus, uncontrolled (HCC) 05/23/2012  . Hyperlipidemia 05/23/2012  .  Obesity, Class II, BMI 35-39.9, with comorbidity (HCC) 05/23/2012  . Depression 05/23/2012  . Tobacco user 05/23/2012  . History of stroke without residual deficits 05/23/2012   Lulu Riding PT, DPT, LAT, ATC  06/17/16  8:51 AM      [facility] [address] [place], [place], [address] Phone: [phone]   Fax:  [phone]  Name: Drew Salinas MRN: [id number] Date of Birth: November 20, 1950

## 2016-06-21 ENCOUNTER — Ambulatory Visit: Payer: PRIVATE HEALTH INSURANCE | Admitting: Physical Therapy

## 2016-06-21 DIAGNOSIS — M25612 Stiffness of left shoulder, not elsewhere classified: Secondary | ICD-10-CM

## 2016-06-21 DIAGNOSIS — M25512 Pain in left shoulder: Secondary | ICD-10-CM | POA: Diagnosis not present

## 2016-06-21 DIAGNOSIS — M6281 Muscle weakness (generalized): Secondary | ICD-10-CM

## 2016-06-21 DIAGNOSIS — R293 Abnormal posture: Secondary | ICD-10-CM

## 2016-06-21 NOTE — Therapy (Signed)
[facility] [address] [place], [place], [address] Phone: [phone]   Fax:  [phone]  Physical Therapy Treatment  Patient Details  Name: Drew Salinas MRN: [id number] Date of Birth: 11/09/1950 Referring Provider: Norlene CampbellPeter Whitfield MD  Encounter Date: 06/21/2016      PT End of Session - 06/21/16 0929    Visit Number 10   Number of Visits 17   Date for PT Re-Evaluation 07/15/16   PT Start Time 0845   PT Stop Time 0934   PT Time Calculation (min) 49 min   Activity Tolerance Patient tolerated treatment well   Behavior During Therapy [facility] for tasks assessed/performed      Past Medical History:  Diagnosis Date  . Allergy   . Diabetes mellitus 1995  . GERD (gastroesophageal reflux disease)   . Hyperlipidemia   . Hypertension   . Sleep apnea    per patients wife  . Stroke [facility](HCC) 1993    Past Surgical History:  Procedure Laterality Date  . APPENDECTOMY      There were no vitals filed for this visit.      Subjective Assessment - 06/21/16 0852    Subjective "doing pretty good today, only minimal soreness in the outside of the shoulder only like a 1/10 today" pt reports not really being too sore last session.    Currently in Pain? Yes   Pain Score 1    Pain Location Shoulder   Pain Orientation Left   Pain Descriptors / Indicators Aching   Pain Type Surgical pain   Pain Onset More than a month ago   Pain Frequency Intermittent   Aggravating Factors  reaching, lifting the shoulder(in the shower)   Pain Relieving Factors Drew Pileadvil, Ice,                          [facility] Adult PT Treatment/Exercise - 06/21/16 0855      Shoulder Exercises: Standing   External Rotation AROM;Strengthening;Left;10 reps;Theraband   Theraband Level (Shoulder External Rotation) Level 1 (Yellow)   Internal Rotation AROM;Strengthening;Left;10 reps;Theraband   Theraband Level (Shoulder Internal Rotation) Level 1 (Yellow)   Flexion  AROM;Strengthening   Extension AROM;Strengthening;Both;10 reps   Theraband Level (Shoulder Extension) Level 1 (Yellow)   Row AROM;Strengthening;Both;10 reps;Theraband   Theraband Level (Shoulder Row) Level 2 (Red)   Other Standing Exercises UE ranger at wall, tandem with scapula, flexion, IR behind back with stepping able to get hand to midline in glute region),      Shoulder Exercises: ROM/Strengthening   UBE (Upper Arm Bike) Nu-Step L4 x 6 min   Other ROM/Strengthening Exercises wall ladder 5 x flexion with controlled eccentric with finger tips on the wall, 3 x abduction with eccentric lower with finger tips on the wall.   verbal cues for keeping hsoulder down     Shoulder Exercises: Stretch   Corner Stretch 2 reps;30 seconds  using doorway     Modalities   Modalities Moist Heat     Moist Heat Therapy   Number Minutes Moist Heat 10 Minutes   Moist Heat Location Shoulder     Manual Therapy   Manual therapy comments --                PT Education - 06/21/16 0929    Education provided Yes   Education Details Updated HEP with proper form and treatment rationale    Person(s) Educated Patient   Methods Explanation;Handout;Verbal cues;Demonstration  Comprehension Verbalized understanding;Returned demonstration          PT Short Term Goals - 06/14/16 1820      PT SHORT TERM GOAL #1   Title independent with initial HEP (06/20/2016)   Time 4   Period Weeks   Status Achieved     PT SHORT TERM GOAL #2   Title verbalize / demo techniques to reduce L shoulder pain and inflammation via RICE and HEP (06/20/2016)   Time 4   Period Weeks   Status Achieved     PT SHORT TERM GOAL #3   Title improve L shoulder PROM flexion/ abduction to >/= 110 and ER to >/= 30 degrees to assist with improving ROM (06/20/2016)   Time 4   Period Weeks   Status On-going     PT SHORT TERM GOAL #4   Title pt will improve L grip strength to >/= 8# to demonstrate improvement in L shoulder  function (06/20/2016)   Time 4   Period Weeks   Status Achieved           PT Long Term Goals - 06/09/16 4098      PT LONG TERM GOAL #1   Title independent with all advanced HEP given throughtout treatment (07/15/2016)   Time 8   Period Weeks   Status On-going     PT LONG TERM GOAL #2   Time 8   Period Weeks   Status On-going     PT LONG TERM GOAL #3   Title improve L shoulder overall strength to >/=4/5 to with </= 2/10 pain to assist with ADLS and job related activities  (07/15/2016)   Time 8   Period Weeks   Status On-going     PT LONG TERM GOAL #4   Title pt will be able to lift >/= 8# above his head with </= 2/10 pain to assist with funcitonal lifting and carrying activities (07/15/2016)   Time 8   Period Weeks   Status On-going     PT LONG TERM GOAL #5   Title increase FOTO score to </= 35% limited to demonstrate improvement in function at discharge (07/15/2016)   Time 8   Period Weeks   Status On-going               Plan - 06/21/16 0930    Clinical Impression Statement [name] continues to progress with treatment, focused today on AAROM with UE ranger and wall ladder and strengthening of the shoulder. pt performed exericses well with no report of soreness or pain. trialed MHP post session for soreness.    PT Next Visit Plan Continue mobs/ROM strength within protocol, strengthening, UE ranger, progress slowly with strengtheing/ scapular mobility.    PT Home Exercise Plan shoulder internal/ external rotation, rows, wall walks, ROM, goals   Consulted and Agree with Plan of Care Patient      Patient will benefit from skilled therapeutic intervention in order to improve the following deficits and impairments:  Pain, Improper body mechanics, Postural dysfunction, Decreased endurance, Decreased activity tolerance, Decreased range of motion, Hypomobility, Decreased strength, Decreased scar mobility, Increased fascial restricitons, Impaired UE functional use,  Impaired flexibility, Increased edema  Visit Diagnosis: Pain in left shoulder  Decreased ROM of left shoulder  Stiffness of left shoulder, not elsewhere classified  Abnormal posture  Muscle weakness (generalized)     Problem List Patient Active Problem List   Diagnosis Date Noted  . De Quervain's tenosynovitis, right 10/19/2015  . 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 Fracture of scaphoid of right wrist with delayed healing 09/03/2015  . Diabetic peripheral neuropathy associated with type 2 diabetes mellitus (HCC) 05/22/2015  . Essential hypertension, benign 05/21/2015  . Erectile dysfunction 10/02/2013  . Hypogonadism male 10/02/2013  . Insulin dependent type 2 diabetes mellitus, uncontrolled (HCC) 05/23/2012  . Hyperlipidemia 05/23/2012  . Obesity, Class II, BMI 35-39.9, with comorbidity (HCC) 05/23/2012  . Depression 05/23/2012  . Tobacco user 05/23/2012  . History of stroke without residual deficits 05/23/2012   Lulu RidingKristoffer Leamon PT, DPT, LAT, ATC  06/21/16  9:34 AM       [facility] [address] [place], [place], [address] Phone: [phone]   Fax:  [phone]  Name: Drew Salinas MRN: [id number] Date of Birth: 01/13/1951

## 2016-06-23 ENCOUNTER — Ambulatory Visit: Payer: PRIVATE HEALTH INSURANCE | Admitting: Physical Therapy

## 2016-06-23 DIAGNOSIS — M25612 Stiffness of left shoulder, not elsewhere classified: Secondary | ICD-10-CM

## 2016-06-23 DIAGNOSIS — M25512 Pain in left shoulder: Secondary | ICD-10-CM

## 2016-06-23 DIAGNOSIS — M6281 Muscle weakness (generalized): Secondary | ICD-10-CM

## 2016-06-23 DIAGNOSIS — R293 Abnormal posture: Secondary | ICD-10-CM

## 2016-06-23 NOTE — Therapy (Signed)
[facility]inas [facility] [address] [place], [place], [address] Phone: [phone]   Fax:  [phone]  Physical Therapy Treatment  Patient Details  Name: Drew Salinas MRN: [id number] Date of Birth: 05/12/1951 Referring Provider: Norlene CampbellPeter Whitfield MD  Encounter Date: 06/23/2016      PT End of Session - 06/23/16 0847    Visit Number 11   Number of Visits 17   Date for PT Re-Evaluation 07/15/16   PT Start Time 0800   PT Stop Time 0853   PT Time Calculation (min) 53 min   Activity Tolerance Patient tolerated treatment well   Behavior During Therapy [facility] for tasks assessed/performed      Past Medical History:  Diagnosis Date  . Allergy   . Diabetes mellitus 1995  . GERD (gastroesophageal reflux disease)   . Hyperlipidemia   . Hypertension   . Sleep apnea    per patients wife  . Stroke Drew Salinas(HCC) 1993    Past Surgical History:  Procedure Laterality Date  . APPENDECTOMY      There were no vitals filed for this visit.      Subjective Assessment - 06/23/16 0759    Subjective "alittle sore todat rated at a 2/10, possibly due to rolling over onto it at night"    Currently in Pain? Yes   Pain Score 2    Pain Location Shoulder   Pain Orientation Left   Pain Onset More than a month ago   Pain Frequency Intermittent                         [facility] Adult PT Treatment/Exercise - 06/23/16 0806      Shoulder Exercises: Supine   Protraction AROM;Strengthening;10 reps;Left  sustained protaction with CW/CCW circles 10 ea. way x 2 set    Other Supine Exercises PNF D2 2 x 10 with no resistance   with AAROM for form,tightness at end rangeextension/IR motin     Shoulder Exercises: Standing   Other Standing Exercises UE ranger at wall, tandem with scapula, flexion, IR behind back with stepping able to get hand to midline in glute region),      Shoulder Exercises: ROM/Strengthening   UBE (Upper Arm Bike) Nu-Step L5 x 6 min  LE/  UE   Other ROM/Strengthening Exercises wall ladder 5 x flexion with controlled eccentric with finger tips on the wall, 3 x abduction with eccentric lower with finger tips on the wall.      Shoulder Exercises: Stretch   Corner Stretch 30 seconds;4 reps  2 x with arms parallel to floor, 2 x with hands by waist   Other Shoulder Stretches sidelying sleeper stretch 2 x 30   verbal cues to avoid pain     Moist Heat Therapy   Number Minutes Moist Heat 10 Minutes   Moist Heat Location Shoulder  pt in supine     Manual Therapy   Manual Therapy Taping   Manual therapy comments manual trigger point release along the distal middle deltoid x 3, and anterior deltoid x 3    Soft tissue mobilization IASTM along middle/ anterior deltoid   Kinesiotex Inhibit Muscle     Kinesiotix   Inhibit Muscle  Lateral deltoid on the L                   PT Short Term Goals - 06/14/16 1820      PT SHORT TERM GOAL #1   Title independent with  initial HEP (06/20/2016)   Time 4   Period Weeks   Status Achieved     PT SHORT TERM GOAL #2   Title verbalize / demo techniques to reduce L shoulder pain and inflammation via RICE and HEP (06/20/2016)   Time 4   Period Weeks   Status Achieved     PT SHORT TERM GOAL #3   Title improve L shoulder PROM flexion/ abduction to >/= 110 and ER to >/= 30 degrees to assist with improving ROM (06/20/2016)   Time 4   Period Weeks   Status On-going     PT SHORT TERM GOAL #4   Title pt will improve L grip strength to >/= 8# to demonstrate improvement in L shoulder function (06/20/2016)   Time 4   Period Weeks   Status Achieved           PT Long Term Goals - 06/09/16 1610      PT LONG TERM GOAL #1   Title independent with all advanced HEP given throughtout treatment (07/15/2016)   Time 8   Period Weeks   Status On-going     PT LONG TERM GOAL #2   Time 8   Period Weeks   Status On-going     PT LONG TERM GOAL #3   Title improve L shoulder overall  strength to >/=4/5 to with </= 2/10 pain to assist with ADLS and job related activities  (07/15/2016)   Time 8   Period Weeks   Status On-going     PT LONG TERM GOAL #4   Title pt will be able to lift >/= 8# above his head with </= 2/10 pain to assist with funcitonal lifting and carrying activities (07/15/2016)   Time 8   Period Weeks   Status On-going     PT LONG TERM GOAL #5   Title increase FOTO score to </= 35% limited to demonstrate improvement in function at discharge (07/15/2016)   Time 8   Period Weeks   Status On-going               Plan - 06/23/16 0847    Clinical Impression Statement Drew Salinas reported some soreness today, focused today on active ROm and scapular stabilizers. performed Soft tissue work to calm down deltoid sorness and perofrmed trial of KT tape for inhibition. pt continues to progress well with treatment.    PT Next Visit Plan Continue mobs/ROM strength within protocol, strengthening, UE ranger, progress slowly with strengtheing/ scapular mobility. PRogress HEP PRN, ROM   Consulted and Agree with Plan of Care Patient      Patient will benefit from skilled therapeutic intervention in order to improve the following deficits and impairments:  Pain, Improper body mechanics, Postural dysfunction, Decreased endurance, Decreased activity tolerance, Decreased range of motion, Hypomobility, Decreased strength, Decreased scar mobility, Increased fascial restricitons, Impaired UE functional use, Impaired flexibility, Increased edema  Visit Diagnosis: Pain in left shoulder  Decreased ROM of left shoulder  Stiffness of left shoulder, not elsewhere classified  Abnormal posture  Muscle weakness (generalized)     Problem List Patient Active Problem List   Diagnosis Date Noted  . De Quervain's tenosynovitis, right 10/19/2015  . 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 Fracture of scaphoid of right wrist with  delayed healing 09/03/2015  . Diabetic peripheral neuropathy associated with type 2 diabetes mellitus (HCC) 05/22/2015  . Essential hypertension, benign 05/21/2015  . Erectile dysfunction 10/02/2013  .  Hypogonadism male 10/02/2013  . Insulin dependent type 2 diabetes mellitus, uncontrolled (HCC) 05/23/2012  . Hyperlipidemia 05/23/2012  . Obesity, Class II, BMI 35-39.9, with comorbidity (HCC) 05/23/2012  . Depression 05/23/2012  . Tobacco user 05/23/2012  . History of stroke without residual deficits 05/23/2012   Lulu RidingKristoffer Amritpal Shropshire PT, DPT, LAT, ATC  06/23/16  8:53 AM      [facility] [address] [place], [place], [address] Phone: [phone]   Fax:  [phone]  Name: Drew Salinas MRN: [id number] Date of Birth: 03/17/1951

## 2016-06-28 ENCOUNTER — Ambulatory Visit: Payer: PRIVATE HEALTH INSURANCE | Admitting: Physical Therapy

## 2016-06-28 DIAGNOSIS — M25612 Stiffness of left shoulder, not elsewhere classified: Secondary | ICD-10-CM

## 2016-06-28 DIAGNOSIS — R293 Abnormal posture: Secondary | ICD-10-CM

## 2016-06-28 DIAGNOSIS — M25512 Pain in left shoulder: Secondary | ICD-10-CM | POA: Diagnosis not present

## 2016-06-28 DIAGNOSIS — M6281 Muscle weakness (generalized): Secondary | ICD-10-CM

## 2016-06-28 NOTE — Therapy (Signed)
[facility] [address] [place], [place], [address] Phone: [phone]   Fax:  [phone]  Physical Therapy Treatment  Patient Details  Name: Drew Salinas MRN: [id number] Date of Birth: 1951/08/13 Referring Provider: Norlene Campbell MD  Encounter Date: 06/28/2016      PT End of Session - 06/28/16 0919    Visit Number 12   Number of Visits 17   Date for PT Re-Evaluation 07/15/16   PT Start Time 0847   PT Stop Time 0931   PT Time Calculation (min) 44 min   Activity Tolerance Patient tolerated treatment well   Behavior During Therapy [facility] for tasks assessed/performed      Past Medical History:  Diagnosis Date  . Allergy   . Diabetes mellitus 1995  . GERD (gastroesophageal reflux disease)   . Hyperlipidemia   . Hypertension   . Sleep apnea    per patients wife  . Stroke [facility]) 1993    Past Surgical History:  Procedure Laterality Date  . APPENDECTOMY      There were no vitals filed for this visit.      Subjective Assessment - 06/28/16 0852    Subjective "The front of the shoulder is a little sore this morning, not bad" exercises are going well.    Currently in Pain? Yes   Pain Score 1    Pain Location Shoulder   Pain Orientation Left   Pain Descriptors / Indicators Aching   Pain Type Surgical pain   Pain Onset More than a month ago   Pain Frequency Intermittent   Aggravating Factors  Lifting the arm/ reaching forward   Pain Relieving Factors advil/ ice for pain,             [facility] PT Assessment - 06/28/16 0001      AROM   Right/Left Shoulder Left   Left Shoulder Internal Rotation --  behind back IR to L PSIS                      [facility] Adult PT Treatment/Exercise - 06/28/16 0854      Shoulder Exercises: Standing   External Rotation AROM;Strengthening;Left;10 reps;Theraband   Theraband Level (Shoulder External Rotation) Level 2 (Red)   Internal Rotation AROM;Strengthening;Left;10  reps;Theraband   Theraband Level (Shoulder Internal Rotation) Level 2 (Red)   Flexion AROM;Strengthening  reaching into cabinet   Extension AROM;Strengthening;Both;10 reps   Theraband Level (Shoulder Extension) Level 3 (Green)   Row AROM;Strengthening;Both;10 reps;Theraband   Theraband Level (Shoulder Row) Level 3 (Green)   Other Standing Exercises internal rotation with towel AAROM behind back 2 x 10 holding at end range of available motion for 5 sec     Shoulder Exercises: ROM/Strengthening   UBE (Upper Arm Bike) L1 x 5 min  changing direct at 2:30      Shoulder Exercises: Stretch   Other Shoulder Stretches sidelying sleeper stretch 2 x 30    Other Shoulder Stretches upper trap stretch 2 x 30 sec hold     Manual Therapy   Scapular Mobilization upward scapular assist with flexion 2 x10 reaching into cabinet at 2nd shelf level   Kinesiotex Inhibit Muscle     Kinesiotix   Inhibit Muscle  anterior/ middle deltoid on the L                 PT Education - 06/28/16 0917    Education provided Yes   Education Details updated and reviewed HEP for shoulder  IR behind the back.   Person(s) Educated Patient   Methods Explanation;Verbal cues;Handout   Comprehension Verbalized understanding;Verbal cues required          PT Short Term Goals - 06/14/16 1820      PT SHORT TERM GOAL #1   Title independent with initial HEP (06/20/2016)   Time 4   Period Weeks   Status Achieved     PT SHORT TERM GOAL #2   Title verbalize / demo techniques to reduce L shoulder pain and inflammation via RICE and HEP (06/20/2016)   Time 4   Period Weeks   Status Achieved     PT SHORT TERM GOAL #3   Title improve L shoulder PROM flexion/ abduction to >/= 110 and ER to >/= 30 degrees to assist with improving ROM (06/20/2016)   Time 4   Period Weeks   Status On-going     PT SHORT TERM GOAL #4   Title pt will improve L grip strength to >/= 8# to demonstrate improvement in L shoulder function  (06/20/2016)   Time 4   Period Weeks   Status Achieved           PT Long Term Goals - 06/09/16 [id number]      PT LONG TERM GOAL #1   Title independent with all advanced HEP given throughtout treatment (07/15/2016)   Time 8   Period Weeks   Status On-going     PT LONG TERM GOAL #2   Time 8   Period Weeks   Status On-going     PT LONG TERM GOAL #3   Title improve L shoulder overall strength to >/=4/5 to with </= 2/10 pain to assist with ADLS and job related activities  (07/15/2016)   Time 8   Period Weeks   Status On-going     PT LONG TERM GOAL #4   Title pt will be able to lift >/= 8# above his head with </= 2/10 pain to assist with funcitonal lifting and carrying activities (07/15/2016)   Time 8   Period Weeks   Status On-going     PT LONG TERM GOAL #5   Title increase FOTO score to </= 35% limited to demonstrate improvement in function at discharge (07/15/2016)   Time 8   Period Weeks   Status On-going               Plan - 06/28/16 0934    Clinical Impression Statement [name] continues to make progress with treatment reporting pain to only 1/10 today. progress RC and scapular stability strengthening, AROM flexion into cabinet with scapular assist to [facility]. continued KT taping to provide relief for deltoid tightness. pt declined modalities due to time limitations today.    PT Next Visit Plan Continue mobs/ROM strength within protocol, strengthening, UE ranger, progress slowly with strengtheing/ scapular mobility. goals, AROM assessment.    PT Home Exercise Plan behind back IR AAROM with towel    Consulted and Agree with Plan of Care Patient      Patient will benefit from skilled therapeutic intervention in order to improve the following deficits and impairments:  Pain, Improper body mechanics, Postural dysfunction, Decreased endurance, Decreased activity tolerance, Decreased range of motion, Hypomobility, Decreased strength, Decreased scar mobility,  Increased fascial restricitons, Impaired UE functional use, Impaired flexibility, Increased edema  Visit Diagnosis: Pain in left shoulder  Decreased ROM of left shoulder  Stiffness of left shoulder, not elsewhere classified  Abnormal posture  Muscle weakness (generalized)     Problem List Patient Active Problem List   Diagnosis Date Noted  . De Quervain's tenosynovitis, right 10/19/2015  . 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 Fracture of scaphoid of right wrist with delayed healing 09/03/2015  . Diabetic peripheral neuropathy associated with type 2 diabetes mellitus (HCC) 05/22/2015  . Essential hypertension, benign 05/21/2015  . Erectile dysfunction 10/02/2013  . Hypogonadism male 10/02/2013  . Insulin dependent type 2 diabetes mellitus, uncontrolled (HCC) 05/23/2012  . Hyperlipidemia 05/23/2012  . Obesity, Class II, BMI 35-39.9, with comorbidity (HCC) 05/23/2012  . Depression 05/23/2012  . Tobacco user 05/23/2012  . History of stroke without residual deficits 05/23/2012   Lulu Riding PT, DPT, LAT, ATC  06/28/16  9:37 AM      [facility] [address] [place], [place], [address] Phone: [phone]   Fax:  [phone]  Name: Drew Salinas MRN: [id number] Date of Birth: 07/10/1951

## 2016-06-30 ENCOUNTER — Ambulatory Visit: Payer: PRIVATE HEALTH INSURANCE | Admitting: Physical Therapy

## 2016-06-30 DIAGNOSIS — M25512 Pain in left shoulder: Secondary | ICD-10-CM | POA: Diagnosis not present

## 2016-06-30 DIAGNOSIS — R293 Abnormal posture: Secondary | ICD-10-CM

## 2016-06-30 DIAGNOSIS — M25612 Stiffness of left shoulder, not elsewhere classified: Secondary | ICD-10-CM

## 2016-06-30 DIAGNOSIS — M6281 Muscle weakness (generalized): Secondary | ICD-10-CM

## 2016-06-30 NOTE — Therapy (Signed)
[facility] [address] [place], [place], [address] Phone: [phone]   Fax:  [phone]  Physical Therapy Treatment  Patient Details  Name: Drew Salinas MRN: [id number] Date of Birth: 01/24/1951 Referring Provider: Norlene CampbellPeter Whitfield MD  Encounter Date: 06/30/2016      PT End of Session - 06/30/16 0845    Visit Number 13   Number of Visits 17   Date for PT Re-Evaluation 07/15/16   PT Start Time 0803   PT Stop Time 0851   PT Time Calculation (min) 48 min   Activity Tolerance Patient tolerated treatment well   Behavior During Therapy [facility] for tasks assessed/performed      Past Medical History:  Diagnosis Date  . Allergy   . Diabetes mellitus 1995  . GERD (gastroesophageal reflux disease)   . Hyperlipidemia   . Hypertension   . Sleep apnea    per patients wife  . Stroke [facility](H[facility]) 1993    Past Surgical History:  Procedure Laterality Date  . APPENDECTOMY      There were no vitals filed for this visit.      Subjective Assessment - 06/30/16 0808    Subjective "shoulders doing better then it was last time, just an ache. Overall I am doing well "    Currently in Pain? Yes   Pain Score 1    Pain Location Shoulder   Pain Orientation Left   Pain Type Surgical pain   Pain Onset More than a month ago   Pain Frequency Intermittent                         [facility] Adult PT Treatment/Exercise - 06/30/16 0809      Shoulder Exercises: Seated   Other Seated Exercises self mobilization in inferior direction 1 x 10 with 5 sec hold     Shoulder Exercises: ROM/Strengthening   UBE (Upper Arm Bike) L1.5 x 6 min  changing direction at 3 min   Other ROM/Strengthening Exercises wall ladder abduction only 5 x with eccentric lower using only finger tips against the wall   additional 3 x with assisted scapular downard rotation     Shoulder Exercises: Stretch   Corner Stretch 2 reps;30 seconds  with hands by hips   Other Shoulder Stretches standing  sleeper stretch 2 x 30   against the wall    Other Shoulder Stretches upper trap stretch 2 x 30 sec hold     Moist Heat Therapy   Number Minutes Moist Heat 10 Minutes   Moist Heat Location Shoulder  pt in supine     Manual Therapy   Scapular Mobilization upward scapular assist with flexion 2 x10 reaching into cabinet at 2nd shelf level   Passive ROM Abduction  and inferior GH mobs, and gentle distraction oscillations mobs  abduction to 142 degrees                 PT Education - 06/30/16 0816    Education provided Yes   Education Details updated HEP for shoulder self-mobilization to improve abduction   Person(s) Educated Patient   Methods Explanation;Demonstration;Verbal cues;Handout   Comprehension Verbalized understanding;Returned demonstration;Verbal cues required          PT Short Term Goals - 06/14/16 1820      PT SHORT TERM GOAL #1   Title independent with initial HEP (06/20/2016)   Time 4   Period Weeks   Status Achieved  PT SHORT TERM GOAL #2   Title verbalize / demo techniques to reduce L shoulder pain and inflammation via RICE and HEP (06/20/2016)   Time 4   Period Weeks   Status Achieved     PT SHORT TERM GOAL #3   Title improve L shoulder PROM flexion/ abduction to >/= 110 and ER to >/= 30 degrees to assist with improving ROM (06/20/2016)   Time 4   Period Weeks   Status On-going     PT SHORT TERM GOAL #4   Title pt will improve L grip strength to >/= 8# to demonstrate improvement in L shoulder function (06/20/2016)   Time 4   Period Weeks   Status Achieved           PT Long Term Goals - 06/09/16 0981      PT LONG TERM GOAL #1   Title independent with all advanced HEP given throughtout treatment (07/15/2016)   Time 8   Period Weeks   Status On-going     PT LONG TERM GOAL #2   Time 8   Period Weeks   Status On-going     PT LONG TERM GOAL #3   Title improve L shoulder overall strength to >/=4/5  to with </= 2/10 pain to assist with ADLS and job related activities  (07/15/2016)   Time 8   Period Weeks   Status On-going     PT LONG TERM GOAL #4   Title pt will be able to lift >/= 8# above his head with </= 2/10 pain to assist with funcitonal lifting and carrying activities (07/15/2016)   Time 8   Period Weeks   Status On-going     PT LONG TERM GOAL #5   Title increase FOTO score to </= 35% limited to demonstrate improvement in function at discharge (07/15/2016)   Time 8   Period Weeks   Status On-going               Plan - 06/30/16 0845    Clinical Impression Statement [name] progresses well with flexion but demonstrates limited abduction secondary to pain, pt demos good PROM. Focused on abduction mobility and strengtheing with manual to work on motion and scapular control.    PT Next Visit Plan Continue mobs/ROM strength within protocol, strengthening, UE ranger, progress slowly with strengtheing/ scapular mobility. goals, AROM assessment.    Consulted and Agree with Plan of Care Patient      Patient will benefit from skilled therapeutic intervention in order to improve the following deficits and impairments:  Pain, Improper body mechanics, Postural dysfunction, Decreased endurance, Decreased activity tolerance, Decreased range of motion, Hypomobility, Decreased strength, Decreased scar mobility, Increased fascial restricitons, Impaired UE functional use, Impaired flexibility, Increased edema  Visit Diagnosis: Pain in left shoulder  Decreased ROM of left shoulder  Stiffness of left shoulder, not elsewhere classified  Abnormal posture  Muscle weakness (generalized)     Problem List Patient Active Problem List   Diagnosis Date Noted  . De Quervain's tenosynovitis, right 10/19/2015  . 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 Fracture of scaphoid of right wrist with delayed healing 09/03/2015  . Diabetic  peripheral neuropathy associated with type 2 diabetes mellitus (HCC) 05/22/2015  . Essential hypertension, benign 05/21/2015  . Erectile dysfunction 10/02/2013  . Hypogonadism male 10/02/2013  . Insulin dependent type 2 diabetes mellitus, uncontrolled (HCC) 05/23/2012  . Hyperlipidemia 05/23/2012  . Obesity, Class II, BMI 35-39.9,  with comorbidity (HCC) 05/23/2012  . Depression 05/23/2012  . Tobacco user 05/23/2012  . History of stroke without residual deficits 05/23/2012   Lulu Riding PT, DPT, LAT, ATC  06/30/16  8:48 AM      [facility] [address] [place], [place], [address] Phone: [phone]   Fax:  [phone]  Name: Drew Salinas MRN: [id number] Date of Birth: 01-01-1951

## 2016-07-03 ENCOUNTER — Other Ambulatory Visit: Payer: Self-pay | Admitting: Family Medicine

## 2016-07-05 ENCOUNTER — Encounter: Payer: PRIVATE HEALTH INSURANCE | Admitting: Physical Therapy

## 2016-07-05 ENCOUNTER — Ambulatory Visit: Payer: PRIVATE HEALTH INSURANCE | Attending: Orthopaedic Surgery | Admitting: Physical Therapy

## 2016-07-05 DIAGNOSIS — R293 Abnormal posture: Secondary | ICD-10-CM | POA: Diagnosis present

## 2016-07-05 DIAGNOSIS — M25612 Stiffness of left shoulder, not elsewhere classified: Secondary | ICD-10-CM | POA: Insufficient documentation

## 2016-07-05 DIAGNOSIS — M6281 Muscle weakness (generalized): Secondary | ICD-10-CM | POA: Insufficient documentation

## 2016-07-05 DIAGNOSIS — M25512 Pain in left shoulder: Secondary | ICD-10-CM | POA: Diagnosis not present

## 2016-07-05 DIAGNOSIS — M7582 Other shoulder lesions, left shoulder: Secondary | ICD-10-CM | POA: Diagnosis not present

## 2016-07-05 NOTE — Therapy (Signed)
[place], [place], [address] Phone: [phone]   Fax:  [phone]  Physical Therapy Treatment  Patient Details  Name: Drew Salinas MRN: [id number] Date of Birth: 1951-01-19 Referring Provider: Joni Fears MD  Encounter Date: 07/05/2016      PT End of Session - 07/05/16 0937    Visit Number 14   Date for PT Re-Evaluation 07/15/16   PT Start Time 0848   PT Stop Time 0934   PT Time Calculation (min) 46 min   Activity Tolerance Patient tolerated treatment well   Behavior During Therapy [facility] for tasks assessed/performed      Past Medical History:  Diagnosis Date  . Allergy   . Diabetes mellitus 1995  . GERD (gastroesophageal reflux disease)   . Hyperlipidemia   . Hypertension   . Sleep apnea    per patients wife  . Stroke [facility]) 1993    Past Surgical History:  Procedure Laterality Date  . APPENDECTOMY      There were no vitals filed for this visit.      Subjective Assessment - 07/05/16 0853    Subjective "alittle sore around the outside of the shoulder which may be due to doing too much this last weekend"    Currently in Pain? Yes   Pain Score 2    Pain Orientation Left   Pain Descriptors / Indicators Aching   Pain Type Surgical pain   Pain Onset More than a month ago   Pain Frequency Intermittent   Aggravating Factors  reaching to the side   Pain Relieving Factors advil/ ice or Heat prn            [facility] PT Assessment - 07/05/16 0001      AROM   Right/Left Shoulder Left   Left Shoulder Extension 50 Degrees   Left Shoulder Flexion 108 Degrees   Left Shoulder ABduction 60 Degrees   Left Shoulder Internal Rotation --  L1   Left Shoulder External Rotation 68 Degrees  with elbow at side                     [facility] Adult PT Treatment/Exercise - 07/05/16 0001      Shoulder Exercises: Sidelying   ABduction AROM;10 reps;Strengthening;Left  with manual distal  clavicle control     Shoulder Exercises: ROM/Strengthening   UBE (Upper Arm Bike) L 2.5 x 6 min  changing direction at 3 min     Manual Therapy   Manual therapy comments manual trigger point release along the distal middle deltoid x 3, and anterior deltoid x 3 in middle deltoid, x 2 in posterior deltoid/ anterior deltid and pec minor ea.    Joint Mobilization proximal/ distal clavicle mobs grade 3 inferior/ posterior oscillations   Soft tissue mobilization IASTM along middle/ anterior deltoid   Scapular Mobilization upward scapular assist with flexion 2 x10 reaching into cabinet at 2nd shelf level                  PT Short Term Goals - 07/05/16 0945      PT SHORT TERM GOAL #1   Title independent with initial HEP (06/20/2016)   Time 4   Period Weeks   Status Achieved     PT SHORT TERM GOAL #2   Title verbalize / demo techniques to reduce L shoulder pain and inflammation via RICE and HEP (06/20/2016)   Time 4   Period Weeks   Status Achieved  PT SHORT TERM GOAL #3   Title improve L shoulder PROM flexion/ abduction to >/= 110 and ER to >/= 30 degrees to assist with improving ROM (06/20/2016)   Time 4   Period Weeks   Status Partially Met     PT SHORT TERM GOAL #4   Title pt will improve L grip strength to >/= 8# to demonstrate improvement in L shoulder function (06/20/2016)   Time 4   Period Weeks   Status Achieved           PT Long Term Goals - 07/05/16 1771      PT LONG TERM GOAL #1   Title independent with all advanced HEP given throughtout treatment (07/15/2016)   Time 8   Period Weeks   Status On-going     PT LONG TERM GOAL #2   Title increase L shoulder AROM flexion/ abduction to >/= 120 degrees and ER >/= 45 degrees with </= 2/10 pain to assist with ADLs and personal grooming and dressing (07/15/2016)   Time 8   Period Weeks   Status On-going     PT LONG TERM GOAL #3   Title improve L shoulder overall strength to >/=4/5 to with </= 2/10 pain to  assist with ADLS and job related activities  (07/15/2016)   Time 8   Period Weeks   Status On-going     PT LONG TERM GOAL #4   Title pt will be able to lift >/= 8# above his head with </= 2/10 pain to assist with funcitonal lifting and carrying activities (07/15/2016)   Time 8   Period Weeks   Status On-going     PT LONG TERM GOAL #5   Title increase FOTO score to </= 35% limited to demonstrate improvement in function at discharge (07/15/2016)   Time 8   Period Weeks   Status On-going               Plan - 07/05/16 1657    Clinical Impression Statement Mr. Fentress is progressing with AROM with flexion and ER/ IR with his shoulder in neutral, but continues to demonstrate limitation with abduction with function PROM in all planes. focused on manual for scapular and clavicle mobs today and soft tissue work to calm down delotid soreness. pt is able to perform sidelying abduction exercise [facility] with minimal soreness noted during eccentric phase and reports increased ease with distal clavicle control during descent. pt reported 1/10 pain  and declined modalities post session .    PT Next Visit Plan Continue mobs/ROM strength within protocol, strengthening, UE ranger, progress slowly with strengtheing/ scapular mobility.    Consulted and Agree with Plan of Care Patient      Patient will benefit from skilled therapeutic intervention in order to improve the following deficits and impairments:  Pain, Improper body mechanics, Postural dysfunction, Decreased endurance, Decreased activity tolerance, Decreased range of motion, Hypomobility, Decreased strength, Decreased scar mobility, Increased fascial restricitons, Impaired UE functional use, Impaired flexibility, Increased edema  Visit Diagnosis: Pain in left shoulder  Decreased ROM of left shoulder  Stiffness of left shoulder, not elsewhere classified  Abnormal posture  Muscle weakness (generalized)     Problem List Patient Active  Problem List   Diagnosis Date Noted  . De Quervain's tenosynovitis, right 10/19/2015  . 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 Fracture of scaphoid of right wrist with delayed healing 09/03/2015  . Diabetic peripheral neuropathy associated with type 2  diabetes mellitus ([place]) 05/22/2015  . Essential hypertension, benign 05/21/2015  . Erectile dysfunction 10/02/2013  . Hypogonadism male 10/02/2013  . Insulin dependent type 2 diabetes mellitus, uncontrolled ([place]) 05/23/2012  . Hyperlipidemia 05/23/2012  . Obesity, Class II, BMI 35-39.9, with comorbidity ([place]) 05/23/2012  . Depression 05/23/2012  . Tobacco user 05/23/2012  . History of stroke without residual deficits 05/23/2012   Starr Lake PT, DPT, LAT, ATC  07/05/16  9:50 AM      [facility] [address] [place], [place], [address] Phone: [phone]   Fax:  [phone]  Name: Adham Johnson MRN: [id number] Date of Birth: Nov 11, 1950

## 2016-07-07 ENCOUNTER — Ambulatory Visit: Payer: PRIVATE HEALTH INSURANCE | Admitting: Physical Therapy

## 2016-07-07 DIAGNOSIS — M25612 Stiffness of left shoulder, not elsewhere classified: Secondary | ICD-10-CM

## 2016-07-07 DIAGNOSIS — R293 Abnormal posture: Secondary | ICD-10-CM

## 2016-07-07 DIAGNOSIS — M25512 Pain in left shoulder: Secondary | ICD-10-CM | POA: Diagnosis not present

## 2016-07-07 DIAGNOSIS — M6281 Muscle weakness (generalized): Secondary | ICD-10-CM

## 2016-07-07 NOTE — Therapy (Signed)
[place], [place], [address] Phone: [phone]   Fax:  [phone]  Physical Therapy Treatment  Patient Details  Name: Drew Salinas MRN: [id number] Date of Birth: [birth year] Referring Provider: Joni Fears MD  Encounter Date: 07/07/2016      PT End of Session - 07/07/16 0758    Visit Number 15   Number of Visits 17   Date for PT Re-Evaluation 07/15/16   PT Start Time 0800   PT Stop Time 0849   PT Time Calculation (min) 49 min   Activity Tolerance Patient tolerated treatment well   Behavior During Therapy [facility] for tasks assessed/performed      Past Medical History:  Diagnosis Date  . Allergy   . Diabetes mellitus 1995  . GERD (gastroesophageal reflux disease)   . Hyperlipidemia   . Hypertension   . Sleep apnea    per patients wife  . Stroke [facility]) 1993    Past Surgical History:  Procedure Laterality Date  . APPENDECTOMY      There were no vitals filed for this visit.      Subjective Assessment - 07/07/16 0759    Subjective "not feeling real sore today, still have some soreness bring the arm to the side"    Currently in Pain? Yes   Pain Score 1    Pain Location Shoulder   Pain Orientation Left   Pain Descriptors / Indicators Sore   Pain Type Surgical pain   Pain Onset More than a month ago   Pain Frequency Intermittent                         [facility] Adult PT Treatment/Exercise - 07/07/16 0805      Shoulder Exercises: Standing   Other Standing Exercises internal rotation with towel AAROM behind back 2 x 10 holding at end range of available motion for 5 sec     Shoulder Exercises: ROM/Strengthening   UBE (Upper Arm Bike) L 2.5 x 6 min  changing direction at 3 min, increasing speed at last 10 sec     Shoulder Exercises: Stretch   Corner Stretch 2 reps;30 seconds   Other Shoulder Stretches rhomboid stretch 3 x 30 sec hold   Other Shoulder Stretches upper trap  stretch 2 x 30 sec hold     Moist Heat Therapy   Number Minutes Moist Heat 10 Minutes   Moist Heat Location Shoulder  in supine     Manual Therapy   Joint Mobilization proximal/ distal clavicle mobs grade 3 inferior/ posterior oscillations   Scapular Mobilization upward scapular assist with flexion 2 x10 reaching into cabinet at 2nd shelf level  focusing on distal clavicl control also     Kinesiotix   Inhibit Muscle  anterior/ middle deltoid on the L with posture control tape                PT Education - 07/07/16 0845    Education provided Yes   Education Details updated HEP for stretching.    Person(s) Educated Patient   Methods Explanation;Demonstration;Verbal cues;Handout   Comprehension Verbalized understanding;Returned demonstration;Verbal cues required          PT Short Term Goals - 07/05/16 0945      PT SHORT TERM GOAL #1   Title independent with initial HEP (06/20/2016)   Time 4   Period Weeks   Status Achieved     PT SHORT TERM GOAL #2  Title verbalize / demo techniques to reduce L shoulder pain and inflammation via RICE and HEP (06/20/2016)   Time 4   Period Weeks   Status Achieved     PT SHORT TERM GOAL #3   Title improve L shoulder PROM flexion/ abduction to >/= 110 and ER to >/= 30 degrees to assist with improving ROM (06/20/2016)   Time 4   Period Weeks   Status Partially Met     PT SHORT TERM GOAL #4   Title pt will improve L grip strength to >/= 8# to demonstrate improvement in L shoulder function (06/20/2016)   Time 4   Period Weeks   Status Achieved           PT Long Term Goals - 07/05/16 4462      PT LONG TERM GOAL #1   Title independent with all advanced HEP given throughtout treatment (07/15/2016)   Time 8   Period Weeks   Status On-going     PT LONG TERM GOAL #2   Title increase L shoulder AROM flexion/ abduction to >/= 120 degrees and ER >/= 45 degrees with </= 2/10 pain to assist with ADLs and personal grooming and  dressing (07/15/2016)   Time 8   Period Weeks   Status On-going     PT LONG TERM GOAL #3   Title improve L shoulder overall strength to >/=4/5 to with </= 2/10 pain to assist with ADLS and job related activities  (07/15/2016)   Time 8   Period Weeks   Status On-going     PT LONG TERM GOAL #4   Title pt will be able to lift >/= 8# above his head with </= 2/10 pain to assist with funcitonal lifting and carrying activities (07/15/2016)   Time 8   Period Weeks   Status On-going     PT LONG TERM GOAL #5   Title increase FOTO score to </= 35% limited to demonstrate improvement in function at discharge (07/15/2016)   Time 8   Period Weeks   Status On-going               Plan - 07/07/16 0845    Clinical Impression Statement Mr. Ferrando conintues to demonstrate difficulty with abduction with soreness in the distal middle deltoid. continued clavlicle mobs with pt reported relief, applied KT inhibition taping to provide relief which he he rpeorted decreased pain with walk walks and eccentric loading.    PT Next Visit Plan Continue mobs/ROM strength within protocol, strengthening, clavicle, scapular mobility.    PT Home Exercise Plan rhomboid stretchs   Consulted and Agree with Plan of Care Patient      Patient will benefit from skilled therapeutic intervention in order to improve the following deficits and impairments:  Pain, Improper body mechanics, Postural dysfunction, Decreased endurance, Decreased activity tolerance, Decreased range of motion, Hypomobility, Decreased strength, Decreased scar mobility, Increased fascial restricitons, Impaired UE functional use, Impaired flexibility, Increased edema  Visit Diagnosis: Pain in left shoulder  Decreased ROM of left shoulder  Stiffness of left shoulder, not elsewhere classified  Abnormal posture  Muscle weakness (generalized)     Problem List Patient Active Problem List   Diagnosis Date Noted  . De Quervain's tenosynovitis,  right 10/19/2015  . 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 Fracture of scaphoid of right wrist with delayed healing 09/03/2015  . Diabetic peripheral neuropathy associated with type 2 diabetes mellitus ([place]) 05/22/2015  . Essential  hypertension, benign 05/21/2015  . Erectile dysfunction 10/02/2013  . Hypogonadism male 10/02/2013  . Insulin dependent type 2 diabetes mellitus, uncontrolled ([place]) 05/23/2012  . Hyperlipidemia 05/23/2012  . Obesity, Class II, BMI 35-39.9, with comorbidity ([place]) 05/23/2012  . Depression 05/23/2012  . Tobacco user 05/23/2012  . History of stroke without residual deficits 05/23/2012   Starr Lake PT, DPT, LAT, ATC  07/07/16  8:48 AM       [facility] [address] [place], [place], [address] Phone: [phone]   Fax:  [phone]  Name: Keylen Eckenrode MRN: [id number] Date of Birth: 1951-01-26

## 2016-07-12 ENCOUNTER — Ambulatory Visit: Payer: PRIVATE HEALTH INSURANCE | Admitting: Physical Therapy

## 2016-07-12 DIAGNOSIS — M6281 Muscle weakness (generalized): Secondary | ICD-10-CM

## 2016-07-12 DIAGNOSIS — M25612 Stiffness of left shoulder, not elsewhere classified: Secondary | ICD-10-CM

## 2016-07-12 DIAGNOSIS — R293 Abnormal posture: Secondary | ICD-10-CM

## 2016-07-12 DIAGNOSIS — M25512 Pain in left shoulder: Secondary | ICD-10-CM | POA: Diagnosis not present

## 2016-07-12 NOTE — Therapy (Signed)
[place], [place], [address] Phone: [phone]   Fax:  [phone]  Physical Therapy Treatment / Discharge Note  Patient Details  Name: Gearld Kerstein MRN: [id number] Date of Birth: 11-Jul-1951 Referring Provider: Joni Fears MD  Encounter Date: 07/12/2016      PT End of Session - 07/12/16 0910    Visit Number 16   Number of Visits 17   Date for PT Re-Evaluation 07/15/16   PT Start Time 0800   PT Stop Time 0840   PT Time Calculation (min) 40 min   Activity Tolerance Patient tolerated treatment well   Behavior During Therapy [facility] for tasks assessed/performed      Past Medical History:  Diagnosis Date  . Allergy   . Diabetes mellitus 1995  . GERD (gastroesophageal reflux disease)   . Hyperlipidemia   . Hypertension   . Sleep apnea    per patients wife  . Stroke [facility]) 1993    Past Surgical History:  Procedure Laterality Date  . APPENDECTOMY      There were no vitals filed for this visit.      Subjective Assessment - 07/12/16 0804    Subjective "No pain, saw the MD and released me and stated that I didn't need to do any more physical therapy so today will be my last day"    Currently in Pain? No/denies            [facility] PT Assessment - 07/12/16 0001      Observation/Other Assessments   Focus on Therapeutic Outcomes (FOTO)  27% limited     AROM   Left Shoulder Extension 60 Degrees   Left Shoulder Flexion 135 Degrees   Left Shoulder ABduction 80 Degrees   Left Shoulder Internal Rotation 50 Degrees  assessed at 90/90   Left Shoulder External Rotation 84 Degrees  assessed at 90/90      Strength   Left Shoulder Flexion 3+/5   Left Shoulder Extension 4+/5   Left Shoulder ABduction 3/5   Left Shoulder Internal Rotation 4+/5   Left Shoulder External Rotation 4+/5   Left Hand Grip (lbs) 70.6  70,71,71                     [facility] Adult PT Treatment/Exercise - 07/12/16  0812      Shoulder Exercises: Standing   Other Standing Exercises wall push ups 2 x 10  HEP     Shoulder Exercises: ROM/Strengthening   UBE (Upper Arm Bike) L 2.5 x 8 min     Shoulder Exercises: Stretch   Corner Stretch 30 seconds;4 reps  2 x hands at waist, 2 x with arms 90 degrees abducted   Other Shoulder Stretches rhomboid stretch  2 x 30    Other Shoulder Stretches sleeper stretch 2 x 30 sec hold                PT Education - 07/12/16 0910    Education provided Yes   Education Details reviewed and updated HEP with proper form, discussed how ot progress exerises to further promote abduction strength appropriately.    Person(s) Educated Patient   Methods Explanation;Demonstration;Verbal cues;Handout   Comprehension Verbal cues required          PT Short Term Goals - 07/12/16 0832      PT SHORT TERM GOAL #1   Title independent with initial HEP (06/20/2016)   Time 4   Period Weeks   Status  Achieved     PT SHORT TERM GOAL #2   Title verbalize / demo techniques to reduce L shoulder pain and inflammation via RICE and HEP (06/20/2016)   Time 4   Period Weeks   Status Achieved     PT SHORT TERM GOAL #3   Title improve L shoulder PROM flexion/ abduction to >/= 110 and ER to >/= 30 degrees to assist with improving ROM (06/20/2016)   Time 4   Period Weeks   Status Achieved     PT SHORT TERM GOAL #4   Title pt will improve L grip strength to >/= 8# to demonstrate improvement in L shoulder function (06/20/2016)   Time 4   Period Weeks   Status Achieved           PT Long Term Goals - 07/12/16 6226      PT LONG TERM GOAL #1   Title independent with all advanced HEP given throughtout treatment (07/15/2016)   Time 8   Period Weeks   Status Achieved     PT LONG TERM GOAL #2   Title increase L shoulder AROM flexion/ abduction to >/= 120 degrees and ER >/= 45 degrees with </= 2/10 pain to assist with ADLs and personal grooming and dressing (07/15/2016)    Baseline Flexion met, abduction to 80 degrees today,    Time 8   Period Weeks   Status Partially Met     PT LONG TERM GOAL #3   Title improve L shoulder overall strength to >/=4/5 to with </= 2/10 pain to assist with ADLS and job related activities  (07/15/2016)   Baseline flexion 3+/5, abduction 3/5, internal/ external rotation 4+/5 and extension 4+/5   Time 8   Period Weeks   Status Partially Met     PT LONG TERM GOAL #4   Title pt will be able to lift >/= 8# above his head with </= 2/10 pain to assist with funcitonal lifting and carrying activities (07/15/2016)   Time 8   Period Weeks   Status Not Met     PT LONG TERM GOAL #5   Title increase FOTO score to </= 35% limited to demonstrate improvement in function at discharge (07/15/2016)   Baseline 27% limited   Time 8   Period Weeks   Status Achieved               Plan - 07/12/16 0911    Clinical Impression Statement Mr. Bolla reports he has been release from his MD and that today will be his last session. He improved his L sholder AROM in all planes but conitnues to demonstrate limitation with abduction likely due to weakness. pt demos funcitonal PROM/ AAROM with no pain. he met all STGs, and partialy met or met all LTG's except for LTG # 4. He reports he is able to maintain and progress his current level of function independenlty and will be discharged from PT today.    PT Next Visit Plan Continue mobs/ROM strength within protocol, strengthening, clavicle, scapular mobility.    PT Home Exercise Plan rhomboid stretchs   Consulted and Agree with Plan of Care Patient      Patient will benefit from skilled therapeutic intervention in order to improve the following deficits and impairments:  Pain, Improper body mechanics, Postural dysfunction, Decreased endurance, Decreased activity tolerance, Decreased range of motion, Hypomobility, Decreased strength, Decreased scar mobility, Increased fascial restricitons, Impaired UE  functional use, Impaired flexibility, Increased edema  Visit Diagnosis: Pain  in left shoulder  Decreased ROM of left shoulder  Stiffness of left shoulder, not elsewhere classified  Abnormal posture  Muscle weakness (generalized)     Problem List Patient Active Problem List   Diagnosis Date Noted  . De Quervain's tenosynovitis, right 10/19/2015  . 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 Fracture of scaphoid of right wrist with delayed healing 09/03/2015  . Diabetic peripheral neuropathy associated with type 2 diabetes mellitus ([place]) 05/22/2015  . Essential hypertension, benign 05/21/2015  . Erectile dysfunction 10/02/2013  . Hypogonadism male 10/02/2013  . Insulin dependent type 2 diabetes mellitus, uncontrolled ([place]) 05/23/2012  . Hyperlipidemia 05/23/2012  . Obesity, Class II, BMI 35-39.9, with comorbidity ([place]) 05/23/2012  . Depression 05/23/2012  . Tobacco user 05/23/2012  . History of stroke without residual deficits 05/23/2012   Starr Lake PT, DPT, LAT, ATC  07/12/16  9:15 AM      [facility] [address] [place], [place], [address] Phone: [phone]   Fax:  [phone]  Name: Syd Newsome MRN: [id number] Date of Birth: 1951-04-17   PHYSICAL THERAPY DISCHARGE SUMMARY  Visits from Start of Care: 16  Current functional level related to goals / functional outcomes: FOTO 27% limitation   Remaining deficits: Limited L shoulder abduction secondary to muscle weakness. Intermittent tightness in the upper trap from hiking the shoulder that is relieved with stretching.    Education / Equipment: HEP, theraband for strengthening, posture  Plan: Patient agrees to discharge.  Patient goals were partially met. Patient is being discharged due to being pleased with the current functional level.  ?????

## 2016-07-14 ENCOUNTER — Ambulatory Visit: Payer: PRIVATE HEALTH INSURANCE | Admitting: Physical Therapy

## 2016-07-19 ENCOUNTER — Other Ambulatory Visit: Payer: Self-pay | Admitting: Endocrinology

## 2016-07-19 ENCOUNTER — Other Ambulatory Visit: Payer: Self-pay | Admitting: Family Medicine

## 2016-07-19 ENCOUNTER — Encounter: Payer: PRIVATE HEALTH INSURANCE | Admitting: Physical Therapy

## 2016-07-21 ENCOUNTER — Encounter: Payer: PRIVATE HEALTH INSURANCE | Admitting: Physical Therapy

## 2016-07-26 ENCOUNTER — Encounter: Payer: PRIVATE HEALTH INSURANCE | Admitting: Physical Therapy

## 2016-07-28 ENCOUNTER — Encounter: Payer: PRIVATE HEALTH INSURANCE | Admitting: Physical Therapy

## 2016-08-11 ENCOUNTER — Other Ambulatory Visit: Payer: Self-pay | Admitting: Endocrinology

## 2016-08-19 ENCOUNTER — Ambulatory Visit (INDEPENDENT_AMBULATORY_CARE_PROVIDER_SITE_OTHER): Payer: PRIVATE HEALTH INSURANCE | Admitting: Orthopaedic Surgery

## 2016-08-19 DIAGNOSIS — M75122 Complete rotator cuff tear or rupture of left shoulder, not specified as traumatic: Secondary | ICD-10-CM

## 2016-08-19 DIAGNOSIS — M19019 Primary osteoarthritis, unspecified shoulder: Secondary | ICD-10-CM

## 2016-08-19 DIAGNOSIS — S46119A Strain of muscle, fascia and tendon of long head of biceps, unspecified arm, initial encounter: Secondary | ICD-10-CM

## 2016-08-23 ENCOUNTER — Other Ambulatory Visit: Payer: Self-pay | Admitting: Endocrinology

## 2016-08-29 ENCOUNTER — Other Ambulatory Visit: Payer: Self-pay | Admitting: Endocrinology

## 2016-09-07 ENCOUNTER — Other Ambulatory Visit: Payer: Self-pay | Admitting: Endocrinology

## 2016-09-09 ENCOUNTER — Other Ambulatory Visit (INDEPENDENT_AMBULATORY_CARE_PROVIDER_SITE_OTHER): Payer: PRIVATE HEALTH INSURANCE

## 2016-09-09 DIAGNOSIS — E1165 Type 2 diabetes mellitus with hyperglycemia: Secondary | ICD-10-CM | POA: Diagnosis not present

## 2016-09-09 DIAGNOSIS — E291 Testicular hypofunction: Secondary | ICD-10-CM

## 2016-09-09 DIAGNOSIS — Z794 Long term (current) use of insulin: Secondary | ICD-10-CM

## 2016-09-09 LAB — MICROALBUMIN / CREATININE URINE RATIO
CREATININE, U: 85.7 mg/dL
MICROALB/CREAT RATIO: 2.3 mg/g (ref 0.0–30.0)
Microalb, Ur: 2 mg/dL — ABNORMAL HIGH (ref 0.0–1.9)

## 2016-09-09 LAB — BASIC METABOLIC PANEL
BUN: 14 mg/dL (ref 6–23)
CO2: 27 meq/L (ref 19–32)
Calcium: 9.5 mg/dL (ref 8.4–10.5)
Chloride: 106 mEq/L (ref 96–112)
Creatinine, Ser: 0.97 mg/dL (ref 0.40–1.50)
GFR: 82.6 mL/min (ref >60.00)
GLUCOSE: 112 mg/dL — AB (ref 70–99)
POTASSIUM: 4.2 meq/L (ref 3.5–5.1)
SODIUM: 141 meq/L (ref 135–145)

## 2016-09-09 LAB — TESTOSTERONE: TESTOSTERONE: 196.2 ng/dL — AB (ref 300.00–890.00)

## 2016-09-09 LAB — LUTEINIZING HORMONE: LH: 10.56 m[IU]/mL — AB (ref 1.50–9.30)

## 2016-09-09 LAB — HEMOGLOBIN A1C: Hgb A1c MFr Bld: 6.6 % — ABNORMAL HIGH (ref 4.6–6.5)

## 2016-09-12 ENCOUNTER — Other Ambulatory Visit: Payer: PRIVATE HEALTH INSURANCE

## 2016-09-12 ENCOUNTER — Other Ambulatory Visit: Payer: Self-pay | Admitting: Endocrinology

## 2016-09-12 ENCOUNTER — Ambulatory Visit (INDEPENDENT_AMBULATORY_CARE_PROVIDER_SITE_OTHER): Payer: PRIVATE HEALTH INSURANCE | Admitting: Ophthalmology

## 2016-09-12 ENCOUNTER — Other Ambulatory Visit: Payer: Self-pay | Admitting: Family Medicine

## 2016-09-12 DIAGNOSIS — H2513 Age-related nuclear cataract, bilateral: Secondary | ICD-10-CM | POA: Diagnosis not present

## 2016-09-12 DIAGNOSIS — I10 Essential (primary) hypertension: Secondary | ICD-10-CM | POA: Diagnosis not present

## 2016-09-12 DIAGNOSIS — H43813 Vitreous degeneration, bilateral: Secondary | ICD-10-CM | POA: Diagnosis not present

## 2016-09-12 DIAGNOSIS — E11319 Type 2 diabetes mellitus with unspecified diabetic retinopathy without macular edema: Secondary | ICD-10-CM

## 2016-09-12 DIAGNOSIS — H35033 Hypertensive retinopathy, bilateral: Secondary | ICD-10-CM | POA: Diagnosis not present

## 2016-09-12 DIAGNOSIS — E113293 Type 2 diabetes mellitus with mild nonproliferative diabetic retinopathy without macular edema, bilateral: Secondary | ICD-10-CM | POA: Diagnosis not present

## 2016-09-12 LAB — HM DIABETES EYE EXAM

## 2016-09-16 ENCOUNTER — Ambulatory Visit (INDEPENDENT_AMBULATORY_CARE_PROVIDER_SITE_OTHER): Payer: PRIVATE HEALTH INSURANCE | Admitting: Endocrinology

## 2016-09-16 ENCOUNTER — Encounter: Payer: Self-pay | Admitting: Endocrinology

## 2016-09-16 VITALS — BP 128/76 | Ht 71.0 in | Wt 250.0 lb

## 2016-09-16 DIAGNOSIS — E1165 Type 2 diabetes mellitus with hyperglycemia: Secondary | ICD-10-CM

## 2016-09-16 DIAGNOSIS — E291 Testicular hypofunction: Secondary | ICD-10-CM | POA: Diagnosis not present

## 2016-09-16 DIAGNOSIS — Z23 Encounter for immunization: Secondary | ICD-10-CM

## 2016-09-16 DIAGNOSIS — Z794 Long term (current) use of insulin: Secondary | ICD-10-CM

## 2016-09-16 MED ORDER — TESTOSTERONE 30 MG/ACT TD SOLN: TRANSDERMAL | 3 refills | Status: DC

## 2016-09-16 NOTE — Patient Instructions (Addendum)
Tresiba 140 units   Novolog 30 at supper  Exercise

## 2016-09-16 NOTE — Progress Notes (Signed)
Patient ID: Drew Salinas, male   DOB: 1951-08-26, 65 y.o.   MRN: [id number]   Chief complaint:  followup    History of Present Illness:  DIABETES:  He was told to have diabetes several years ago and has been on insulin since about 2006 Previously in [place] he was taking large doses of insulin with Levemir 100 units twice a day along with Novolog 30-36 units before meals.  He prefers to take Levemir with a syringe because of needing large doses He has also been taking Janumet for a few years His A1c had been typically consistently high with this regimen  RECENT history:   INSULIN doses: TRESIBA 150 units day, Novolog 25-30 before meals  He had been switched from Levemir to [facility] in 9/16 Also was started on Jardiance 10 mg daily and Janumet continued.  His A1c is relatively higher at 6.6, previously 6.1  Current blood sugar patterns and problems identified:  He did not bring his monitor for download today  Even with reducing Tresiba by 10 units on the last visit he thinks his fasting readings are usually below 100 and as low as 60, lab fasting glucose was 112  However he thinks his blood sugars averaging 180 after evening meal  He usually takes 25 units of Novolog at his meals, not clear if he is checking his blood sugars consistently nonfasting  He does exercise 3/7 days a week at least   GLUCOSE readings checked on One Touch ultra monitor from recall:  PRE-MEAL Fasting Lunch Dinner Bedtime Overall  Glucose range: 60-95   180   Mean/median:         He has not had a consultation with dietitian in several years Last CDE consultation: 06/2015  Wt Readings from Last 3 Encounters:  09/16/16 250 lb (113.4 kg)  06/16/16 245 lb (111.1 kg)  04/14/16 257 lb (116.6 kg)     Lab Results  Component Value Date   HGBA1C 6.6 (H) 09/09/2016   HGBA1C 6.1 06/13/2016   HGBA1C 7.2 (H) 03/02/2016   Lab Results  Component Value Date   MICROALBUR 2.0 (H) 09/09/2016   LDLCALC 33 10/01/2015   CREATININE 0.97 09/09/2016     HYPOGONADISM: He was seen in consultation in 9/14 for evaluation of a low testosterone level. This was initially tested because of erectile dysfunction At that time his libido had been fairly good and also did not complain of any unusual fatigue, poor muscle endurance,  lack of motivation or depression Evaluation confirmed a low free testosterone level as well as normal prolactin. LH level was mildly increased  Because of documented hypogonadism he was started on Androderm 4 mg patch  However because of skin irritation with the patch and inconsistent adherence he was changed to AndroGel Apparently in 2015 this was changed to [place] by his PCP   Testosterone levels: Baseline 151, after starting AndroGel, 420 in 12/14  Recent history: Subjectively with testosterone supplementation he previously had more energy level and mood was improved. With AndroGel he was using 3 pumps daily but this is not covered by insurance With using Axiron his testosterone levels were consistently low even with using the correct application technique  He is taking clomiphene and is taking half tablet daily. Even with taking this regularly now his testosterone level is below 200 although he does not think he has unusual fatigue, sometimes does feel sluggish or sleepy at work [facility] is relatively high again   Lab Results  Component Value  Date   TESTOSTERONE 196.20 (L) 09/09/2016        Medication List       Accurate as of 09/16/16 12:44 PM. Always use your most recent med list.          amLODipine 5 MG tablet Commonly known as:  NORVASC TAKE 1 TABLET BY MOUTH DAILY   atorvastatin 10 MG tablet Commonly known as:  LIPITOR TAKE 1 TABLET BY MOUTH DAILY AT 6PM   AXIRON 30 MG/ACT Soln Generic drug:  Testosterone APPLY 1 PUMP UNDER EACH ARM DAILY   BD PEN NEEDLE NANO U/F 32G X 4 MM Misc Generic drug:  Insulin Pen Needle USE 3 PER DAY TO  INJECT INSULIN   buPROPion 300 MG 24 hr tablet Commonly known as:  WELLBUTRIN XL TAKE 1 TABLET (300 MG TOTAL) BY MOUTH DAILY.   clomiPHENE 50 MG tablet Commonly known as:  CLOMID TAKE 1/2 TABLET BY MOUTH EVERY DAY   clopidogrel 75 MG tablet Commonly known as:  PLAVIX TAKE 1 TABLET BY MOUTH DAILY   clotrimazole 1 % cream Commonly known as:  LOTRIMIN Apply 1 application topically 2 (two) times daily. To feet x 2 wks   ibuprofen 200 MG tablet Commonly known as:  ADVIL,MOTRIN Take 400 mg by mouth every 6 (six) hours as needed (2-3 times day).   Insulin Syringe-Needle U-100 31G X 5/16" 1 ML Misc Commonly known as:  B-D INS SYR ULTRAFINE 1CC/31G Use as directed 4-6 times daily.   JANUMET 50-1000 MG tablet Generic drug:  sitaGLIPtin-metformin TAKE 1 TABLET BY MOUTH 2 (TWO) TIMES DAILY.   JARDIANCE 10 MG Tabs tablet Generic drug:  empagliflozin TAKE 1 TABLET BY MOUTH EVERY DAY   methocarbamol 500 MG tablet Commonly known as:  ROBAXIN Take 500 mg by mouth every 6 (six) hours as needed for muscle spasms.   nitroGLYCERIN 0.2 mg/hr patch Commonly known as:  NITRODUR - Dosed in mg/24 hr 1/4 patch daily   NOVOLOG 100 UNIT/ML injection Generic drug:  insulin aspart INJECT 30-40 UNITS INTO THE SKIN 3 TIMES A DAY WITH MEALS BY SLIDING SCALE   ONE TOUCH ULTRA TEST test strip Generic drug:  glucose blood USE AS INSTRUCTED TO CHECK BLOOD SUGAR 3 TIMES DAILY   ONE TOUCH ULTRA TEST test strip Generic drug:  glucose blood USE AS INSTRUCTED TO CHECK BLOOD SUGAR 3 TIMES DAILY   oxyCODONE-acetaminophen 5-325 MG tablet Commonly known as:  PERCOCET/ROXICET Take 1-2 tablets by mouth every 4 (four) hours as needed for severe pain.   tadalafil 20 MG tablet Commonly known as:  CIALIS Take 1 tablet (20 mg total) by mouth daily as needed for erectile dysfunction.   traMADol 50 MG tablet Commonly known as:  ULTRAM Take 1 tablet (50 mg total) by mouth every 12 (twelve) hours as needed.     traMADol 50 MG tablet Commonly known as:  ULTRAM Take 1 tablet (50 mg total) by mouth every 8 (eight) hours as needed.   traZODone 100 MG tablet Commonly known as:  DESYREL TAKE 1 TABLET BY MOUTH AT BEDTIME   TRESIBA FLEXTOUCH 200 UNIT/ML Sopn Generic drug:  Insulin Degludec INJECT 100 UNITS INTO THE SKIN 2 (TWO) TIMES DAILY.   TRESIBA FLEXTOUCH 200 UNIT/ML Sopn Generic drug:  Insulin Degludec INJECT 100 UNITS INTO THE SKIN 2 (TWO) TIMES DAILY.   Vitamin D (Ergocalciferol) 50000 units Caps capsule Commonly known as:  DRISDOL Take 1 capsule (50,000 Units total) by mouth every 7 (seven) days.  Allergies:  Allergies  Allergen Reactions  . Lisinopril Cough    Past Medical History:  Diagnosis Date  . Allergy   . Diabetes mellitus 1995  . GERD (gastroesophageal reflux disease)   . Hyperlipidemia   . Hypertension   . Sleep apnea    per patients wife  . Stroke [facility](HCC) 1993    Past Surgical History:  Procedure Laterality Date  . APPENDECTOMY      Family History  Problem Relation Age of Onset  . Diabetes Mother   . Diabetes Brother     Social History:  reports that he has been smoking Cigarettes.  He has a 22.50 pack-year smoking history. He has never used smokeless tobacco. He reports that he drinks about 1.2 oz of alcohol per week . He reports that he does not use drugs.  REVIEW of systems:    He has history of hyperlipidemia treated with Lipitor  Lab Results  Component Value Date   CHOL 107 (L) 10/01/2015   HDL 46 10/01/2015   LDLCALC 33 10/01/2015   TRIG 141 10/01/2015   CHOLHDL 2.3 10/01/2015    History of hypertension treated with amlodipine   ERECTILE dysfunction: Has been prescribed Cialis  Taking Plavix for history of stroke with no residual deficit  LABS:   Lab on 09/09/2016  Component Date Value Ref Range Status  . Sodium 09/09/2016 141  135 - 145 mEq/L Final  . Potassium 09/09/2016 4.2  3.5 - 5.1 mEq/L Final  . Chloride 09/09/2016  106  96 - 112 mEq/L Final  . CO2 09/09/2016 27  19 - 32 mEq/L Final  . Glucose, Bld 09/09/2016 112* 70 - 99 mg/dL Final  . BUN 16/10/960411/07/2016 14  6 - 23 mg/dL Final  . Creatinine, Ser 09/09/2016 0.97  0.40 - 1.50 mg/dL Final  . Calcium 54/09/811911/07/2016 9.5  8.4 - 10.5 mg/dL Final  . GFR 14/78/295611/07/2016 82.60  >60.00 mL/min Final  . Hgb A1c MFr Bld 09/09/2016 6.6* 4.6 - 6.5 % Final  . Microalb, Ur 09/09/2016 2.0* 0.0 - 1.9 mg/dL Final  . Creatinine,U 21/30/865711/07/2016 85.7  mg/dL Final  . Microalb Creat Ratio 09/09/2016 2.3  0.0 - 30.0 mg/g Final  . Testosterone 09/09/2016 196.20* 300.00 - 890.00 ng/dL Final  . LH 84/69/629511/07/2016 10.56* 1.50 - 9.30 mIU/mL Final   Comment: Male Reference Range:20-70 yrs     1.5-9.3 mIU/mL>70 yrs       3.1-35.6 mIU/mLFemale Reference Range:Follicular Phase     1.9-12.5 mIU/mLMidcycle             8.7-76.3 mIU/mLLuteal Phase         0.5-16.9 mIU/mL  Post Menopausal      15.9-54.0  mIU/mLPregnant             <1.5 mIU/mLContraceptives       0.7-5.6 mIU/mL      General Examination:   BP 128/76   Ht 5\' 11"  (1.803 m)   Wt 250 lb (113.4 kg)   BMI 34.87 kg/m    Assessment/ Plan:  DIABETES type II with obesity: See history of present illness for detailed discussion of current diabetes management, blood sugar patterns and problems identified  His blood sugars are Generally well controlled although A1c higher at 6.6, previously was excellent with A1c 6.1 He may be getting excessive amount of basal insulin as fasting readings are reportedly again low normal at home He is not clear about how his postprandial readings are but he thinks they are averaging 180 Most  likely he needs 30 units of NovoLog at meals instead of 25, discussed that this will depend in part on his meal size and carbohydrate intake and his activity level He does need to work harder on his weight loss He can increase frequency and duration of exercise with walking Consider consultation with dietitian    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,  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 He has been  symptomatic with low levels of testosterone  Again his testosterone level is low with clomiphene alone although he is not complaining of excessive fatigue most of the time Since his level is below 200 he will need to supplement with Axiron again AndroGel apparently is not covered by his pharmacy Discussed that we cannot increase clomiphene for that since [facility] is already high  Patient Instructions  Tresiba 140 units   Novolog 30 at supper  Exercise    Counseling time on subjects discussed above is over 50% of today's 25 minute visit      [facility]  09/16/2016 12:44 PM  Note: This office note was prepared with Dragon voice recognition system technology. Any transcriptional errors that result from this process are unintentional.

## 2016-10-05 NOTE — Progress Notes (Signed)
Subjective:    Patient ID: Drew Salinas, male    DOB: [birth year], 65 y.o.   MRN: [id number] Chief Complaint  Patient presents with  . Follow-up    6 MONTH    HPI  Drew Salinas is a delightful 65 yo male here for a 6 mo follow-up on his chronic medical conditions.  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 Dwyane Dee. On Janumet and tresiba, mealtime novolog Hypogonadism: On testosterone supp and clomid from Dr. Dwyane Dee, prn cialis HTN: good control with Norvasc 5. HPL: On atorvastatin 10 OSA: using the CPAP machine every night. + Tob use: ongoing (wife still smoking as well)- about 1/3 ppd.  Since 65 yo.  Advised screening lung CT. On wellbutrin which is helping some. He has talked with his wife Drew Salinas January 1!   Obesity: H/o CVA: at 65 yo, on Plavix, still with occ tingling on L side. Vit D def: on supplement? Was 24 4 yrs prior Insomnia: trazodone 100 mg qhs  Went to [place] over [place] to see 4 grandkinds Increased coughing, sneezing, and left side of throat getting sore  Past Medical History:  Diagnosis Date  . Allergy   . Diabetes mellitus 1995  . GERD (gastroesophageal reflux disease)   . Hyperlipidemia   . Hypertension   . Sleep apnea    per patients wife  . Stroke [facility]) 1993   Past Surgical History:  Procedure Laterality Date  . APPENDECTOMY     Current Outpatient Prescriptions on File Prior to Visit  Medication Sig Dispense Refill  . amLODipine (NORVASC) 5 MG tablet TAKE 1 TABLET BY MOUTH DAILY 90 tablet 0  . atorvastatin (LIPITOR) 10 MG tablet TAKE 1 TABLET BY MOUTH DAILY AT 6PM 90 tablet 0  . buPROPion (WELLBUTRIN XL) 300 MG 24 hr tablet TAKE 1 TABLET (300 MG TOTAL) BY MOUTH DAILY. 90 tablet 2  . clomiPHENE (CLOMID) 50 MG tablet TAKE 1/2 TABLET BY MOUTH EVERY DAY 15 tablet 2  . clopidogrel (PLAVIX) 75 MG tablet TAKE 1 TABLET BY MOUTH DAILY 90 tablet 0  . ibuprofen (ADVIL,MOTRIN) 200 MG tablet Take 400 mg by mouth every 6 (six) hours as needed (2-3 times day).    Marland Kitchen  JANUMET 50-1000 MG tablet TAKE 1 TABLET BY MOUTH 2 (TWO) TIMES DAILY. 60 tablet 2  . JARDIANCE 10 MG TABS tablet TAKE 1 TABLET BY MOUTH EVERY DAY 30 tablet 3  . nitroGLYCERIN (NITRODUR - DOSED IN MG/24 HR) 0.2 mg/hr patch 1/4 patch daily 30 patch 1  . NOVOLOG 100 UNIT/ML injection INJECT 30-40 UNITS INTO THE SKIN 3 TIMES A DAY WITH MEALS BY SLIDING SCALE (Patient taking differently: INJECT 20-30 UNITS INTO THE SKIN 3 TIMES A DAY WITH MEALS BY SLIDING SCALE) 30 mL 3  . TRESIBA FLEXTOUCH 200 UNIT/ML SOPN INJECT 100 UNITS INTO THE SKIN 2 (TWO) TIMES DAILY. (Patient taking differently: INJECT 150 UNITS INTO THE SKIN 1 TIME DAILY.) 27 mL 2  . Vitamin D, Ergocalciferol, (DRISDOL) 50000 UNITS CAPS capsule Take 1 capsule (50,000 Units total) by mouth every 7 (seven) days. 8 capsule 0  . BD PEN NEEDLE NANO U/F 32G X 4 MM MISC USE 3 PER DAY TO INJECT INSULIN 100 each 3  . clotrimazole (LOTRIMIN) 1 % cream Apply 1 application topically 2 (two) times daily. To feet x 2 wks (Patient not taking: Reported on 10/06/2016) 113 g 2  . Insulin Syringe-Needle U-100 (B-D INS SYR ULTRAFINE 1CC/31G) 31G X 5/16" 1 ML MISC Use as directed  4-6 times daily. 600 each 5  . ONE TOUCH ULTRA TEST test strip USE AS INSTRUCTED TO CHECK BLOOD SUGAR 3 TIMES DAILY 100 each 3  . ONE TOUCH ULTRA TEST test strip USE AS INSTRUCTED TO CHECK BLOOD SUGAR 3 TIMES DAILY 100 each 3  . oxyCODONE-acetaminophen (PERCOCET/ROXICET) 5-325 MG tablet Take 1-2 tablets by mouth every 4 (four) hours as needed for severe pain.    . tadalafil (CIALIS) 20 MG tablet Take 1 tablet (20 mg total) by mouth daily as needed for erectile dysfunction. (Patient not taking: Reported on 10/06/2016) 10 tablet 3  . Testosterone (AXIRON) 30 MG/ACT SOLN Apply 1 pump on each armpit every morning (Patient not taking: Reported on 10/06/2016) 90 mL 3  . traMADol (ULTRAM) 50 MG tablet Take 1 tablet (50 mg total) by mouth every 12 (twelve) hours as needed. (Patient not taking: Reported  on 10/06/2016) 60 tablet 0  . traMADol (ULTRAM) 50 MG tablet Take 1 tablet (50 mg total) by mouth every 8 (eight) hours as needed. 30 tablet 0  . TRESIBA FLEXTOUCH 200 UNIT/ML SOPN INJECT 100 UNITS INTO THE SKIN 2 (TWO) TIMES DAILY. (Patient not taking: Reported on 09/16/2016) 27 mL 2   No current facility-administered medications on file prior to visit.    Allergies  Allergen Reactions  . Lisinopril Cough   Family History  Problem Relation Age of Onset  . Diabetes Mother   . Diabetes Brother    Social History   Social History  . Marital status: Married    Spouse name: N/A  . Number of children: 3  . Years of education: N/A   Occupational History  . [employer]   Social History Main Topics  . Smoking status: Current Some Day Smoker    Packs/day: 0.50    Years: 45.00    Types: Cigarettes    Last attempt to quit: 07/05/2013  . Smokeless tobacco: Never Used     Comment: quit on 07/05/2013  . Alcohol use 1.2 oz/week    2 Cans of beer per week  . Drug use: No  . Sexual activity: Yes   Other Topics Concern  . None   Social History Narrative  . None   Depression screen [facility] 2/9 10/06/2016 03/31/2016 10/01/2015 05/22/2015 01/16/2015  Decreased Interest 0 0 0 0 0  Down, Depressed, Hopeless 0 0 0 0 0  PHQ - 2 Score 0 0 0 0 0    Review of Systems  Constitutional: Negative for activity change, appetite change, chills, diaphoresis, fever and unexpected weight change.  HENT: Positive for congestion, postnasal drip, rhinorrhea, sneezing, sore throat and trouble swallowing. Negative for drooling, ear pain, facial swelling, nosebleeds, sinus pain, sinus pressure and tinnitus.   Respiratory: Positive for cough. Negative for chest tightness, shortness of breath and wheezing.   Cardiovascular: Negative for chest pain, palpitations and leg swelling.  Psychiatric/Behavioral: Negative for dysphoric mood and sleep disturbance.       Objective:   Physical Exam    Constitutional: He is oriented to person, place, and time. He appears well-developed and well-nourished. No distress.  HENT:  Head: Normocephalic and atraumatic.  Eyes: Conjunctivae are normal. Pupils are equal, round, and reactive to light. No scleral icterus.  Neck: Normal range of motion. Neck supple. No thyromegaly present.  Cardiovascular: Normal rate, regular rhythm, normal heart sounds and intact distal pulses.   Pulmonary/Chest: Effort normal and breath sounds normal. No respiratory distress.  Musculoskeletal: He exhibits no edema.  Lymphadenopathy:  He has no cervical adenopathy.  Neurological: He is alert and oriented to person, place, and time.  Skin: Skin is warm and dry. He is not diaphoretic.  Psychiatric: He has a normal mood and affect. His behavior is normal.      BP 140/80 (BP Location: Right Arm, Patient Position: Sitting, Cuff Size: Large)   Pulse 79   Temp 98.7 F (37.1 C) (Oral)   Resp 16   Ht 5' 10.5" (1.791 m)   Wt 249 lb 12.8 oz (113.3 kg)   SpO2 96%   BMI 35.34 kg/m      Assessment & Plan:  eGFR 38 Dr. Dwyane Dee is getting most labs - lipid panel ordered with next labs, cmp 3 mos prior - is due for psa. Flu shot in Nov 2017 sched for [facility] visit in 6 mos. Call if going to increase to 450. 1. Essential hypertension, benign   2. Tobacco user   3. Obesity, Class II, BMI 35-39.9, with comorbidity   4. Hyperlipidemia, unspecified hyperlipidemia type   5. History of stroke without residual deficits     Meds ordered this encounter  Medications  . methocarbamol (ROBAXIN) 500 MG tablet    Sig: Take 1-2 tablets (500-1,000 mg total) by mouth at bedtime as needed for muscle spasms.    Dispense:  60 tablet    Refill:  2    I personally performed the services described in this documentation, which was scribed in my presence. The recorded information has been reviewed and considered, and addended by me as needed.   Delman Cheadle, M.D.  [facility] [address] [place], [place] [address] [phone] phone [phone] fax  11/16/16 4:56 AM

## 2016-10-06 ENCOUNTER — Encounter: Payer: Self-pay | Admitting: Family Medicine

## 2016-10-06 ENCOUNTER — Ambulatory Visit (INDEPENDENT_AMBULATORY_CARE_PROVIDER_SITE_OTHER): Payer: PRIVATE HEALTH INSURANCE | Admitting: Family Medicine

## 2016-10-06 VITALS — BP 140/80 | HR 79 | Temp 98.7°F | Resp 16 | Ht 70.5 in | Wt 249.8 lb

## 2016-10-06 DIAGNOSIS — Z720 Tobacco use: Secondary | ICD-10-CM | POA: Diagnosis not present

## 2016-10-06 DIAGNOSIS — E785 Hyperlipidemia, unspecified: Secondary | ICD-10-CM

## 2016-10-06 DIAGNOSIS — Z8673 Personal history of transient ischemic attack (TIA), and cerebral infarction without residual deficits: Secondary | ICD-10-CM

## 2016-10-06 DIAGNOSIS — E669 Obesity, unspecified: Secondary | ICD-10-CM

## 2016-10-06 DIAGNOSIS — I10 Essential (primary) hypertension: Secondary | ICD-10-CM

## 2016-10-06 DIAGNOSIS — IMO0001 Reserved for inherently not codable concepts without codable children: Secondary | ICD-10-CM

## 2016-10-06 MED ORDER — METHOCARBAMOL 500 MG PO TABS: 500.0000 mg | ORAL_TABLET | Freq: Every evening | ORAL | 2 refills | Status: DC | PRN

## 2016-10-06 NOTE — Patient Instructions (Addendum)
Try 400 units of magnesium prior to that time. Make sure you are keeping well hydrated. If this is ineffective for your muscle cramps then you can try methocarbamol. It would be fine to take and magnesium every night with your nighttime medications and if the muscle cramps persist cyst on one evening then take a methocarbamol.  Let me know if you need to increase your Wellbutrin after the first of the year.  Remember to ask Dr. Remus BlakeKumar's office to draw the labs I have ordered on you today at your next lab visit.  Try zinc supplementation lozenges in Zicam or Cold-Eeze every 2-4 hours for the next 48 hours in hopes of preventing the onset of a viral upper respiratory infection.  Whenever you switch over to Medicare make an appointment with me for a welcome to Medicare visit.     IF you received an x-ray today, you will receive an invoice from [facility]. Please contact [facility] at [phone] with questions or concerns regarding your invoice.   IF you received labwork today, you will receive an invoice from [facility]. Please contact [facility] at [phone] with questions or concerns regarding your invoice.   Our billing staff will not be able to assist you with questions regarding bills from these companies.  You will be contacted with the lab results as soon as they are available. The fastest way to get your results is to activate your My Chart account. Instructions are located on the last page of this paperwork. If you have not heard from us regarding the results in 2 weeks, please contact this office.     Steps to Quit Smoking Smoking tobacco can be harmful to your health and can affect almost every organ in your body. Smoking puts you, and those around you, at risk for developing many serious chronic diseases. Quitting smoking is difficult, but it is one of the best things that you can do for your health. It is never too late to  quit. What are the benefits of quitting smoking? When you quit smoking, you lower your risk of developing serious diseases and conditions, such as:  Lung cancer or lung disease, such as COPD.  Heart disease.  Stroke.  Heart attack.  Infertility.  Osteoporosis and bone fractures. Additionally, symptoms such as coughing, wheezing, and shortness of breath may get better when you quit. You may also find that you get sick less often because your body is stronger at fighting off colds and infections. If you are pregnant, quitting smoking can help to reduce your chances of having a baby of low birth weight. How do I get ready to quit? When you decide to quit smoking, create a plan to make sure that you are successful. Before you quit:  Pick a date to quit. Set a date within the next two weeks to give you time to prepare.  Write down the reasons why you are quitting. Keep this list in places where you will see it often, such as on your bathroom mirror or in your car or wallet.  Identify the people, places, things, and activities that make you want to smoke (triggers) and avoid them. Make sure to take these actions:  Throw away all cigarettes at home, at work, and in your car.  Throw away smoking accessories, such as Set designerashtrays and lighters.  Clean your car and make sure to empty the ashtray.  Clean your home, including curtains and carpets.  Tell your family, friends, and  coworkers that you are quitting. Support from your loved ones can make quitting easier.  Talk with your health care provider about your options for quitting smoking.  Find out what treatment options are covered by your health insurance. What strategies can I use to quit smoking? Talk with your healthcare provider about different strategies to quit smoking. Some strategies include:  Quitting smoking altogether instead of gradually lessening how much you smoke over a period of time. Research shows that quitting "cold  [place]" is more successful than gradually quitting.  Attending in-person counseling to help you build problem-solving skills. You are more likely to have success in quitting if you attend several counseling sessions. Even short sessions of 10 minutes can be effective.  Finding resources and support systems that can help you to quit smoking and remain smoke-free after you quit. These resources are most helpful when you use them often. They can include:  Online chats with a Veterinary surgeoncounselor.  Telephone quitlines.  Printed Materials engineerself-help materials.  Support groups or group counseling.  Text messaging programs.  Mobile phone applications.  Taking medicines to help you quit smoking. (If you are pregnant or breastfeeding, talk with your health care provider first.) Some medicines contain nicotine and some do not. Both types of medicines help with cravings, but the medicines that include nicotine help to relieve withdrawal symptoms. Your health care provider may recommend:  Nicotine patches, gum, or lozenges.  Nicotine inhalers or sprays.  Non-nicotine medicine that is taken by mouth. Talk with your health care provider about combining strategies, such as taking medicines while you are also receiving in-person counseling. Using these two strategies together makes you more likely to succeed in quitting than if you used either strategy on its own. If you are pregnant or breastfeeding, talk with your health care provider about finding counseling or other support strategies to quit smoking. Do not take medicine to help you quit smoking unless told to do so by your health care provider. What things can I do to make it easier to quit? Quitting smoking might feel overwhelming at first, but there is a lot that you can do to make it easier. Take these important actions:  Reach out to your family and friends and ask that they support and encourage you during this time. Call telephone quitlines, reach out to support  groups, or work with a counselor for support.  Ask people who smoke to avoid smoking around you.  Avoid places that trigger you to smoke, such as bars, parties, or smoke-break areas at work.  Spend time around people who do not smoke.  Lessen stress in your life, because stress can be a smoking trigger for some people. To lessen stress, try:  Exercising regularly.  Deep-breathing exercises.  Yoga.  Meditating.  Performing a body scan. This involves closing your eyes, scanning your body from head to toe, and noticing which parts of your body are particularly tense. Purposefully relax the muscles in those areas.  Download or purchase mobile phone or tablet apps (applications) that can help you stick to your quit plan by providing reminders, tips, and encouragement. There are many free apps, such as QuitGuide from the [employer] developer(Centers for Disease Control and Prevention). You can find other support for quitting smoking (smoking cessation) through smokefree.gov and other websites. How will I feel when I quit smoking? Within the first 24 hours of quitting smoking, you may start to feel some withdrawal symptoms. These symptoms are usually most noticeable 2-3 days after quitting,  but they usually do not last beyond 2-3 weeks. Changes or symptoms that you might experience include:  Mood swings.  Restlessness, anxiety, or irritation.  Difficulty concentrating.  Dizziness.  Strong cravings for sugary foods in addition to nicotine.  Mild weight gain.  Constipation.  Nausea.  Coughing or a sore throat.  Changes in how your medicines work in your body.  A depressed mood.  Difficulty sleeping (insomnia). After the first 2-3 weeks of quitting, you may start to notice more positive results, such as:  Improved sense of smell and taste.  Decreased coughing and sore throat.  Slower heart rate.  Lower blood pressure.  Clearer skin.  The ability to breathe more easily.  Fewer sick  days. Quitting smoking is very challenging for most people. Do not get discouraged if you are not successful the first time. Some people need to make many attempts to quit before they achieve long-term success. Do your best to stick to your quit plan, and talk with your health care provider if you have any questions or concerns. This information is not intended to replace advice given to you by your health care provider. Make sure you discuss any questions you have with your health care provider. Document Released: 10/11/2001 Document Revised: 06/14/2016 Document Reviewed: 03/03/2015 Elsevier Interactive Patient Education  2017 [employer].

## 2016-10-16 ENCOUNTER — Other Ambulatory Visit: Payer: Self-pay | Admitting: Family Medicine

## 2016-10-19 NOTE — Telephone Encounter (Signed)
Pharmacy is calling to follow up on a request for Trazodone 100mg .  Please advise  Pharmacy phone: [phone]

## 2016-10-19 NOTE — Telephone Encounter (Signed)
07/20/16 last 90 day refill 09/2016 last ov

## 2016-12-05 ENCOUNTER — Other Ambulatory Visit: Payer: Self-pay | Admitting: Endocrinology

## 2016-12-13 ENCOUNTER — Other Ambulatory Visit (INDEPENDENT_AMBULATORY_CARE_PROVIDER_SITE_OTHER): Payer: PRIVATE HEALTH INSURANCE

## 2016-12-13 DIAGNOSIS — Z794 Long term (current) use of insulin: Secondary | ICD-10-CM

## 2016-12-13 DIAGNOSIS — E1165 Type 2 diabetes mellitus with hyperglycemia: Secondary | ICD-10-CM

## 2016-12-13 DIAGNOSIS — E291 Testicular hypofunction: Secondary | ICD-10-CM

## 2016-12-13 LAB — LDL CHOLESTEROL, DIRECT: Direct LDL: 33 mg/dL

## 2016-12-13 LAB — LIPID PANEL
CHOLESTEROL: 138 mg/dL (ref 0–200)
HDL: 39 mg/dL — ABNORMAL LOW (ref >39.00)
NonHDL: 99.22
Total CHOL/HDL Ratio: 4
Triglycerides: 206 mg/dL — ABNORMAL HIGH (ref 0.0–149.0)
VLDL: 41.2 mg/dL — ABNORMAL HIGH (ref 0.0–40.0)

## 2016-12-13 LAB — HEMOGLOBIN A1C: HEMOGLOBIN A1C: 6.8 % — AB (ref 4.6–6.5)

## 2016-12-13 LAB — TESTOSTERONE: TESTOSTERONE: 225.68 ng/dL — AB (ref 300.00–890.00)

## 2016-12-16 ENCOUNTER — Encounter: Payer: Self-pay | Admitting: Endocrinology

## 2016-12-16 ENCOUNTER — Other Ambulatory Visit: Payer: Self-pay

## 2016-12-16 ENCOUNTER — Other Ambulatory Visit: Payer: PRIVATE HEALTH INSURANCE

## 2016-12-16 ENCOUNTER — Ambulatory Visit (INDEPENDENT_AMBULATORY_CARE_PROVIDER_SITE_OTHER): Payer: PRIVATE HEALTH INSURANCE | Admitting: Endocrinology

## 2016-12-16 VITALS — BP 122/64 | HR 72 | Ht 70.5 in | Wt 248.0 lb

## 2016-12-16 DIAGNOSIS — Z794 Long term (current) use of insulin: Secondary | ICD-10-CM | POA: Diagnosis not present

## 2016-12-16 DIAGNOSIS — IMO0002 Reserved for concepts with insufficient information to code with codable children: Secondary | ICD-10-CM

## 2016-12-16 DIAGNOSIS — E1165 Type 2 diabetes mellitus with hyperglycemia: Secondary | ICD-10-CM | POA: Diagnosis not present

## 2016-12-16 DIAGNOSIS — I10 Essential (primary) hypertension: Secondary | ICD-10-CM

## 2016-12-16 DIAGNOSIS — E291 Testicular hypofunction: Secondary | ICD-10-CM | POA: Diagnosis not present

## 2016-12-16 MED ORDER — CLOMIPHENE CITRATE 50 MG PO TABS
25.0000 mg | ORAL_TABLET | Freq: Every day | ORAL | 2 refills | Status: DC
Start: 2016-12-16 — End: 2017-06-22

## 2016-12-16 MED ORDER — EMPAGLIFLOZIN 25 MG PO TABS: 25.0000 mg | ORAL_TABLET | Freq: Every day | ORAL | 2 refills | Status: DC

## 2016-12-16 MED ORDER — SITAGLIPTIN PHOS-METFORMIN HCL 50-1000 MG PO TABS: 1.0000 | ORAL_TABLET | Freq: Two times a day (BID) | ORAL | 2 refills | Status: DC

## 2016-12-16 NOTE — Patient Instructions (Addendum)
Check coverage of Axiron  Check blood sugars on waking up 3x weekly  Also check blood sugars about 2 hours after a meal and do this after different meals by rotation  Recommended blood sugar levels on waking up is 90-130 and about 2 hours after meal is 130-160  Please bring your blood sugar monitor to each visit, thank you  25 Units in am  30-35 at other meals  Tresiba 144 units  Double jardiance

## 2016-12-16 NOTE — Progress Notes (Signed)
Patient ID: Drew Salinas, male   DOB: 02/01/1951, 66 y.o.   MRN: [id number]   Chief complaint:  followup of endocrine problems    History of Present Illness:  DIABETES:  He was told to have diabetes several years ago and has been on insulin since about 2006 [place] in [place] was taking large doses of insulin with Levemir 100 units twice a day along with Novolog 30-36 units before meals.  He prefers to take Levemir with a syringe because of needing large doses He has also been taking Janumet for a few years His A1c had been typically consistently high with this regimen  RECENT history:  INSULIN doses: TRESIBA 150 units day, Novolog  30 before meals  He had [facility] Also was started on Jardiance 10 mg daily and Janumet continued.  His A1c is relatively higher at 6.8, previously has low as 6.1  Current management, blood sugar patterns and problems identified:  He did  bring his monitor for download today  Even after reminding him to check readings after meals consistently he is only reading blood sugars in the mornings with only a couple of after meal readings  He now says that if his blood sugars in the morning or below about 100 he will not take his morning NovoLog; on one instance he had a postprandial reading of 189 morning reading 75 previous to that  Also has a reading of 209 late afternoon  He also has had readings as low as 66 fasting  He thinks diet has been inconsistent over the last few months and has not lost any weight  Currently not adjusting his mealtime doses based on what he is eating  He does exercise walks 30 min 3/7 days a week at least  Mean values apply above for all meters except median for One Touch  PRE-MEAL Fasting Lunch Dinner Bedtime Overall  Glucose range: 66-197       Mean/median: 96        POST-MEAL PC Breakfast PC Lunch PC Dinner  Glucose range: 189  209    Mean/median:      He has not  had a consultation with dietitian in several years Last CDE consultation: 06/2015  Wt Readings from Last 3 Encounters:  12/16/16 248 lb (112.5 kg)  10/06/16 249 lb 12.8 oz (113.3 kg)  09/16/16 250 lb (113.4 kg)     Lab Results  Component Value Date   HGBA1C 6.8 (H) 12/13/2016   HGBA1C 6.6 (H) 09/09/2016   HGBA1C 6.1 06/13/2016   Lab Results  Component Value Date   MICROALBUR 2.0 (H) 09/09/2016   LDLCALC 33 10/01/2015   CREATININE 0.97 09/09/2016      HYPOGONADISM: He was seen in consultation in 9/14 for evaluation of a low testosterone level. This was initially tested because of erectile dysfunction At that time his libido had been fairly good and also did not complain of any unusual fatigue, poor muscle endurance,  lack of motivation or depression Evaluation confirmed a low free testosterone level as well as normal prolactin. LH level was mildly increased  Because of documented hypogonadism he was started on Androderm 4 mg patch  However because of skin irritation with the patch and inconsistent adherence he was changed to AndroGel Apparently in 2015 this was changed to [place] by his PCP   Testosterone levels: Baseline 151, after starting AndroGel, 420 in 12/14  Recent history: Subjectively with testosterone supplementation he previously  had more energy level and mood was improved. With AndroGel he was using 3 pumps daily but With using Axiron his testosterone levels were consistently low He has been continued on clomiphene that was started in 2017  Axiron was prescribed on his last visit but not clear if this was covered by insurance and he is not taking any He is still feeling somewhat fatigued Testosterone level is slightly better than before but still low with his regimen of CLOMIPHENE half tablet daily    Lab Results  Component Value Date   TESTOSTERONE 225.68 (L) 12/13/2016      Allergies as of 12/16/2016      Reactions   Lisinopril Cough        Medication List       Accurate as of 12/16/16  1:37 PM. Always use your most recent med list.          amLODipine 5 MG tablet Commonly known as:  NORVASC TAKE 1 TABLET BY MOUTH DAILY   atorvastatin 10 MG tablet Commonly known as:  LIPITOR TAKE 1 TABLET BY MOUTH DAILY AT 6PM   BD PEN NEEDLE NANO U/F 32G X 4 MM Misc Generic drug:  Insulin Pen Needle USE 3 PER DAY TO INJECT INSULIN   buPROPion 300 MG 24 hr tablet Commonly known as:  WELLBUTRIN XL TAKE 1 TABLET (300 MG TOTAL) BY MOUTH DAILY.   clomiPHENE 50 MG tablet Commonly known as:  CLOMID Take 0.5 tablets (25 mg total) by mouth daily.   clopidogrel 75 MG tablet Commonly known as:  PLAVIX TAKE 1 TABLET BY MOUTH DAILY   clotrimazole 1 % cream Commonly known as:  LOTRIMIN Apply 1 application topically 2 (two) times daily. To feet x 2 wks   empagliflozin 25 MG Tabs tablet Commonly known as:  JARDIANCE Take 25 mg by mouth daily.   ibuprofen 200 MG tablet Commonly known as:  ADVIL,MOTRIN Take 400 mg by mouth every 6 (six) hours as needed (2-3 times day).   Insulin Syringe-Needle U-100 31G X 5/16" 1 ML Misc Commonly known as:  B-D INS SYR ULTRAFINE 1CC/31G Use as directed 4-6 times daily.   methocarbamol 500 MG tablet Commonly known as:  ROBAXIN Take 1-2 tablets (500-1,000 mg total) by mouth at bedtime as needed for muscle spasms.   nitroGLYCERIN 0.2 mg/hr patch Commonly known as:  NITRODUR - Dosed in mg/24 hr 1/4 patch daily   NOVOLOG 100 UNIT/ML injection Generic drug:  insulin aspart INJECT 30-40 UNITS INTO THE SKIN 3 TIMES A DAY WITH MEALS BY SLIDING SCALE   ONE TOUCH ULTRA TEST test strip Generic drug:  glucose blood USE AS INSTRUCTED TO CHECK BLOOD SUGAR 3 TIMES DAILY   oxyCODONE-acetaminophen 5-325 MG tablet Commonly known as:  PERCOCET/ROXICET Take 1-2 tablets by mouth every 4 (four) hours as needed for severe pain.   sitaGLIPtin-metformin 50-1000 MG tablet Commonly known as:  JANUMET Take 1  tablet by mouth 2 (two) times daily.   tadalafil 20 MG tablet Commonly known as:  CIALIS Take 1 tablet (20 mg total) by mouth daily as needed for erectile dysfunction.   Testosterone 30 MG/ACT Soln Commonly known as:  AXIRON Apply 1 pump on each armpit every morning   traMADol 50 MG tablet Commonly known as:  ULTRAM Take 1 tablet (50 mg total) by mouth every 8 (eight) hours as needed.   traZODone 100 MG tablet Commonly known as:  DESYREL TAKE 1 TABLET BY MOUTH AT BEDTIME   TRESIBA FLEXTOUCH 200  UNIT/ML Sopn Generic drug:  Insulin Degludec INJECT 100 UNITS INTO THE SKIN 2 (TWO) TIMES DAILY.       Allergies:  Allergies  Allergen Reactions  . Lisinopril Cough    Past Medical History:  Diagnosis Date  . Allergy   . Diabetes mellitus 1995  . GERD (gastroesophageal reflux disease)   . Hyperlipidemia   . Hypertension   . Sleep apnea    per patients wife  . [facility]roke [facility]    Past Surgical History:  Procedure Laterality Date  . APPENDECTOMY      Family History  Problem Relation Age of Onset  . Diabetes Mother   . Diabetes Brother     Social History:  reports that he has been smoking Cigarettes.  He has a 22.50 pack-year smoking history. He has never used smokeless tobacco. He reports that he drinks about 1.2 oz of alcohol per week . He reports that he does not use drugs.  REVIEW of systems:    He has history of hyperlipidemia treated with Lipitor  Lab Results  Component Value Date   CHOL 138 12/13/2016   HDL 39.00 (L) 12/13/2016   LDLCALC 33 10/01/2015   LDLDIRECT 33.0 12/13/2016   TRIG 206.0 (H) 12/13/2016   CHOLHDL 4 12/13/2016    History of hypertension treated with amlodipine, Followed by PCP   ERECTILE dysfunction: Has been prescribed Cialis  He has mild occasional numbness in the bottom of his  toes at times   Taking Plavix for history of stroke with no residual deficit  LABS:   Lab on 12/13/2016  Component Date Value Ref Range Status   . Hgb A1c MFr Bld 12/13/2016 6.8* 4.6 - 6.5 % Final  . Testosterone 12/13/2016 225.68* 300.00 - 890.00 ng/dL Final  . Cholesterol 16/08/9603 138  0 - 200 mg/dL Final  . Triglycerides 12/13/2016 206.0* 0.0 - 149.0 mg/dL Final  . HDL 54/07/8118 39.00* >39.00 mg/dL Final  . VLDL 14/78/2956 41.2* 0.0 - 40.0 mg/dL Final  . Total CHOL/HDL Ratio 12/13/2016 4   Final  . NonHDL 12/13/2016 99.22   Final  . Direct LDL 12/13/2016 33.0  mg/dL Final  Abstract on 21/30/8657  Component Date Value Ref Range Status  . HM Diabetic Eye Exam 09/12/2016 Retinopathy* No Retinopathy Final     General Examination:   BP 122/64   Pulse 72   Ht 5' 10.5" (1.791 m)   Wt 248 lb (112.5 kg)   SpO2 96%   BMI 35.08 kg/m   Diabetic Foot Exam - Simple   Simple Foot Form Diabetic Foot exam was performed with the following findings:  Yes   Visual Inspection No deformities, no ulcerations, no other skin breakdown bilaterally:  Yes Sensation Testing Intact to touch and monofilament testing bilaterally:  Yes Pulse Check Posterior Tibialis and Dorsalis pulse intact bilaterally:  Yes Comments     Assessment/ Plan:  DIABETES type II with obesity: See history of present illness for detailed discussion of current diabetes management, blood sugar patterns and problems identified  His blood sugars are Overall well controlled with A1c below 7 However this gradually getting higher and now 6.8 Most likely this is related to postprandial hyperglycemia Part of this is from his not taking his Novolog in the morning when blood sugars are low normal because of fear of hypoglycemia May also need more insulin for other meals based on his intake and difficult to assess since he does not check his sugars after meals at all despite  reminders  Recommendations:  Reduce TRESIBA by 6 units since fasting blood sugars are averaging below 100  Discussed adjustment based on fasting blood sugar patterns  He needs to consistently  trying monitor his blood sugars after meals at various times  He can take 35 units for larger meals  Also if he is having low normal sugars in the morning taken cut his dose down to 25  Consistent diet  Consultation with dietitian   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,  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 He has been  symptomatic with low levels of testosterone  Again his testosterone level is low with clomiphene alone although slightly improved Does have some fatigue Since his level is below 200 he will need to supplement with a testosterone gel but he still not clear what his insurance will cover He will call to find out if Axiron or AndroGel are covered  Patient Instructions  Check coverage of Axiron  Check blood sugars on waking up 3x weekly  Also check blood sugars about 2 hours after a meal and do this after different meals by rotation  Recommended blood sugar levels on waking up is 90-130 and about 2 hours after meal is 130-160  Please bring your blood sugar monitor to each visit, thank you  25 Units in am  30-35 at other meals  Tresiba 144 units  Double jardiance   Counseling time on subjects discussed above is over 50% of today's 25 minute visit      [facility]  12/16/2016 1:37 PM  Note: This office note was prepared with Dragon voice recognition system technology. Any transcriptional errors that result from this process are unintentional.

## 2016-12-26 ENCOUNTER — Other Ambulatory Visit: Payer: Self-pay | Admitting: *Deleted

## 2016-12-26 DIAGNOSIS — E782 Mixed hyperlipidemia: Secondary | ICD-10-CM

## 2016-12-26 MED ORDER — ATORVASTATIN CALCIUM 10 MG PO TABS: ORAL_TABLET | ORAL | 0 refills | Status: DC

## 2016-12-26 MED ORDER — AMLODIPINE BESYLATE 5 MG PO TABS: 5.0000 mg | ORAL_TABLET | Freq: Every day | ORAL | 0 refills | Status: DC

## 2017-01-08 ENCOUNTER — Other Ambulatory Visit: Payer: Self-pay | Admitting: Family Medicine

## 2017-01-11 ENCOUNTER — Other Ambulatory Visit: Payer: Self-pay | Admitting: Endocrinology

## 2017-01-12 ENCOUNTER — Other Ambulatory Visit: Payer: Self-pay | Admitting: Endocrinology

## 2017-01-12 DIAGNOSIS — E1165 Type 2 diabetes mellitus with hyperglycemia: Secondary | ICD-10-CM

## 2017-01-12 DIAGNOSIS — Z794 Long term (current) use of insulin: Principal | ICD-10-CM

## 2017-01-13 ENCOUNTER — Encounter: Payer: Self-pay | Admitting: Dietician

## 2017-01-13 ENCOUNTER — Encounter: Payer: PRIVATE HEALTH INSURANCE | Attending: Endocrinology | Admitting: Dietician

## 2017-01-13 DIAGNOSIS — Z794 Long term (current) use of insulin: Secondary | ICD-10-CM | POA: Insufficient documentation

## 2017-01-13 DIAGNOSIS — Z713 Dietary counseling and surveillance: Secondary | ICD-10-CM | POA: Diagnosis not present

## 2017-01-13 DIAGNOSIS — E1165 Type 2 diabetes mellitus with hyperglycemia: Secondary | ICD-10-CM | POA: Diagnosis not present

## 2017-01-13 DIAGNOSIS — IMO0002 Reserved for concepts with insufficient information to code with codable children: Secondary | ICD-10-CM

## 2017-01-13 DIAGNOSIS — Z6835 Body mass index (BMI) 35.0-35.9, adult: Secondary | ICD-10-CM | POA: Diagnosis not present

## 2017-01-13 NOTE — Patient Instructions (Signed)
Be more active.  Aim for 30-60 minutes most days of the week. Increase your non starchy vegetable intake. Small amount of protein with each meal and snack.  Aim for 3-4 Carb Choices per meal (45-60 grams) +/- 1 either way  Aim for 0-1 Carbs per snack if hungry  Consider reading food labels for Total Carbohydrate and Fat Grams of foods Continue checking BG at alternate times per day as directed by MD  Continue taking medication as directed by MD

## 2017-01-13 NOTE — Progress Notes (Signed)
Diabetes Self-Management Education  Visit Type: First/Initial  Appt. Start Time: 0940 Appt. End Time: 1045  01/13/2017  Mr. Drew RetortLarry Salinas, identified by name and date of birth, is a [age] y.o. male with a diagnosis of Diabetes: Type 2. He has been on insulin since 2006.  Other hx includes GERD, OSA on C-pap, HTN, HLD, CVA.  He recently quit smoking.  Medications include Tresiba 144 units daily, Novolog 25 units with each meal, Janumet, Jardiance.  Patient lives with is wife.  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  ASSESSMENT  Height 5\' 11"  (1.803 m), weight 251 lb (113.9 kg). Body mass index is 35.01 kg/m.  Patient has lost from 272 lbs 1 year ago and his weight has been stable for the past 6 months.  He lost weight with mindful eating, exercise and when medication was added.  Highest weight was 272 lbs.   Lowest weight 190 lbs while following a low carbohydrate diet several years ago.      Diabetes Self-Management Education - 01/13/17 0951      Visit Information   Visit Type First/Initial     Initial Visit   Diabetes Type Type 2   Are you currently following a meal plan? No   Are you taking your medications as prescribed? Yes   Date Diagnosed insulin since 2006     Health Coping   How would you rate your overall health? Good     Psychosocial Assessment   Patient Belief/Attitude about Diabetes Motivated to manage diabetes   Self-care barriers None   Self-management support Doctor's office;Family   Other persons present Patient   Patient Concerns Nutrition/Meal planning;Weight Control   Special Needs None   Preferred Learning Style No preference indicated   Learning Readiness Ready   How often do you need to have someone help you when you read instructions, pamphlets, or other written materials from your doctor or pharmacy? 1 - Never   What is the last grade level you completed in school? 12th grade     Pre-Education  Assessment   Patient understands the diabetes disease and treatment process. Needs Review   Patient understands incorporating nutritional management into lifestyle. Needs Review   Patient undertands incorporating physical activity into lifestyle. Needs Review   Patient understands using medications safely. Needs Review   Patient understands monitoring blood glucose, interpreting and using results Needs Review   Patient understands prevention, detection, and treatment of acute complications. Needs Review   Patient understands prevention, detection, and treatment of chronic complications. Needs Review   Patient understands how to develop strategies to address psychosocial issues. Needs Review   Patient understands how to develop strategies to promote health/change behavior. Needs Review     Complications   Last HgB A1C per patient/outside source 6.8 %   How often do you check your blood sugar? 3-4 times/day   Fasting Blood glucose range (mg/dL) 16-10970-129   Postprandial Blood glucose range (mg/dL) 604-540;[id number];130-179   Number of hypoglycemic episodes per month 1   Can you tell when your blood sugar is low? Yes   What do you do if your blood sugar is low? drinks juice or regular soda   Number of hyperglycemic episodes per week 5   Can you tell when your blood sugar is high? No   Have you had a dilated eye exam in the past 12 months? Yes   Have you had a dental exam in the past  12 months? Yes   Are you checking your feet? Yes   How many days per week are you checking your feet? 7     Dietary Intake   Breakfast cereal (honey nut cheerios) 2% milk OR egg and english muffin   Snack (morning) occasional grapes or string cheese   Lunch sandwich ([place]), chips, sugar free pudding   Snack (afternoon) banana   Dinner salad and canned soup   Snack (evening) occasional sugar free cookies (4)     Exercise   Exercise Type Light (walking / raking leaves)   How many days per week to you exercise? 3   How  many minutes per day do you exercise? 30   Total minutes per week of exercise 90     Patient Education   Previous Diabetes Education Yes (please comment)  7-10 years ago   Disease state  Definition of diabetes, type 1 and 2, and the diagnosis of diabetes   Nutrition management  Role of diet in the treatment of diabetes and the relationship between the three main macronutrients and blood glucose level;Food label reading, portion sizes and measuring food.;Carbohydrate counting;Meal options for control of blood glucose level and chronic complications.;Reviewed blood glucose goals for pre and post meals and how to evaluate the patients' food intake on their blood glucose level.   Physical activity and exercise  Role of exercise on diabetes management, blood pressure control and cardiac health.   Medications Reviewed patients medication for diabetes, action, purpose, timing of dose and side effects.   Monitoring Identified appropriate SMBG and/or A1C goals.   Acute complications Taught treatment of hypoglycemia - the 15 rule.   Chronic complications Relationship between chronic complications and blood glucose control;Assessed and discussed foot care and prevention of foot problems;Dental care;Retinopathy and reason for yearly dilated eye exams   Psychosocial adjustment Worked with patient to identify barriers to care and solutions;Role of stress on diabetes;Identified and addressed patients feelings and concerns about diabetes     Individualized Goals (developed by patient)   Nutrition General guidelines for healthy choices and portions discussed   Physical Activity Exercise 5-7 days per week;45 minutes per day   Medications take my medication as prescribed   Monitoring  test my blood glucose as discussed   Reducing Risk examine blood glucose patterns   Health Coping discuss diabetes with (comment)  MD/RD     Post-Education Assessment   Patient understands the diabetes disease and treatment  process. Demonstrates understanding / competency   Patient understands incorporating nutritional management into lifestyle. Demonstrates understanding / competency   Patient undertands incorporating physical activity into lifestyle. Demonstrates understanding / competency   Patient understands using medications safely. Demonstrates understanding / competency   Patient understands monitoring blood glucose, interpreting and using results Demonstrates understanding / competency   Patient understands prevention, detection, and treatment of acute complications. Demonstrates understanding / competency   Patient understands prevention, detection, and treatment of chronic complications. Demonstrates understanding / competency   Patient understands how to develop strategies to address psychosocial issues. Demonstrates understanding / competency   Patient understands how to develop strategies to promote health/change behavior. Demonstrates understanding / competency     Outcomes   Expected Outcomes Demonstrated interest in learning. Expect positive outcomes   Future DMSE PRN   Program Status Completed      Individualized Plan for Diabetes Self-Management Training:   Learning Objective:  Patient will have a greater understanding of diabetes self-management. Patient education plan is to attend individual  and/or group sessions per assessed needs and concerns.   Plan:   Patient Instructions  Be more active.  Aim for 30-60 minutes most days of the week. Increase your non starchy vegetable intake. Small amount of protein with each meal and snack.  Aim for 3-4 Carb Choices per meal (45-60 grams) +/- 1 either way  Aim for 0-1 Carbs per snack if hungry  Consider reading food labels for Total Carbohydrate and Fat Grams of foods Continue checking BG at alternate times per day as directed by MD  Continue taking medication as directed by MD    Expected Outcomes:  Demonstrated interest in learning.  Expect positive outcomes  Education material provided: Living Well with Diabetes, Food label handouts, A1C conversion sheet, Meal plan card and Snack sheet  If problems or questions, patient to contact team via:  Phone and Email  Future DSME appointment: PRN

## 2017-02-09 ENCOUNTER — Other Ambulatory Visit: Payer: Self-pay | Admitting: Endocrinology

## 2017-02-16 ENCOUNTER — Other Ambulatory Visit: Payer: Self-pay | Admitting: Family Medicine

## 2017-02-17 NOTE — Telephone Encounter (Signed)
09/2016 last ov and refill 

## 2017-03-17 ENCOUNTER — Other Ambulatory Visit (INDEPENDENT_AMBULATORY_CARE_PROVIDER_SITE_OTHER): Payer: PRIVATE HEALTH INSURANCE

## 2017-03-17 DIAGNOSIS — E1165 Type 2 diabetes mellitus with hyperglycemia: Secondary | ICD-10-CM

## 2017-03-17 DIAGNOSIS — Z794 Long term (current) use of insulin: Secondary | ICD-10-CM

## 2017-03-17 DIAGNOSIS — E291 Testicular hypofunction: Secondary | ICD-10-CM

## 2017-03-17 DIAGNOSIS — IMO0002 Reserved for concepts with insufficient information to code with codable children: Secondary | ICD-10-CM

## 2017-03-17 LAB — TESTOSTERONE: TESTOSTERONE: 208.6 ng/dL — AB (ref 300.00–890.00)

## 2017-03-17 LAB — COMPREHENSIVE METABOLIC PANEL
ALT: 24 U/L (ref 0–53)
AST: 26 U/L (ref 0–37)
Albumin: 4 g/dL (ref 3.5–5.2)
Alkaline Phosphatase: 58 U/L (ref 39–117)
BUN: 16 mg/dL (ref 6–23)
CHLORIDE: 107 meq/L (ref 96–112)
CO2: 28 meq/L (ref 19–32)
CREATININE: 1.12 mg/dL (ref 0.40–1.50)
Calcium: 9.3 mg/dL (ref 8.4–10.5)
GFR: 69.85 mL/min (ref >60.00)
Glucose, Bld: 110 mg/dL — ABNORMAL HIGH (ref 70–99)
POTASSIUM: 4.4 meq/L (ref 3.5–5.1)
SODIUM: 142 meq/L (ref 135–145)
Total Bilirubin: 0.3 mg/dL (ref 0.2–1.2)
Total Protein: 6.7 g/dL (ref 6.0–8.3)

## 2017-03-17 LAB — HEMOGLOBIN A1C: Hgb A1c MFr Bld: 6.7 % — ABNORMAL HIGH (ref 4.6–6.5)

## 2017-03-19 NOTE — Progress Notes (Signed)
Patient ID: Drew Salinas, male   DOB: [birth year], 66 y.o.   MRN: [id number]   Chief complaint:  followup of endocrine problems    History of Present Illness:  DIABETES:  He was told to have diabetes several years ago and has been on insulin since about 2006 Previously in [place] he was taking large doses of insulin with Levemir 100 units twice a day along with Novolog 30-36 units before meals.  He prefers to take Levemir with a syringe because of needing large doses He has also been taking Janumet for a few years His A1c had been typically consistently high with this regimen He had been switched from Levemir to [facility] in 9/16  RECENT history:  INSULIN doses: TRESIBA 144 units day, Novolog 26 before meals usually  Oral hypoglycemic drugs: Jardiance 25 mg daily and Janumet 50/1000 twice a day.  His A1c is generally in the same range and now 6.7  Current management, blood sugar patterns and problems identified:  He did  bring his monitor for download today  Since his fasting readings are sometimes low normal he was told to reduce Tresiba by 6 units  Most of his morning readings are near normal  He has finally done a few readings after meals but mostly after breakfast and couple of readings after lunch, however none after his evening meal  On his own he has cut back his mealtime insulin from 30 down to 26, this was without any tendency to hypoglycemia during the day  He has seen the dietitian and was told to avoid cereal in the morning but he is still doing this mostly in May have readings as high as 194 after eating  Has variable readings after lunch  Has lost a little weight  Still not adjusting his mealtime doses based on what he is eating  He does exercise for about 30 min 3/7 days a week while at work  Mean values apply above for all meters except median for One Touch  PRE-MEAL Fasting Lunch Dinner Bedtime Overall  Glucose range: 84-1 67       Mean/median: 110        POST-MEAL PC Breakfast PC Lunch PC Dinner  Glucose range: 145-194  115, 183    Mean/median:        Last diet/ CDE consultation: 3/18  Wt Readings from Last 3 Encounters:  03/20/17 247 lb 12.8 oz (112.4 kg)  01/13/17 251 lb (113.9 kg)  12/16/16 248 lb (112.5 kg)     Lab Results  Component Value Date   HGBA1C 6.7 (H) 03/17/2017   HGBA1C 6.8 (H) 12/13/2016   HGBA1C 6.6 (H) 09/09/2016   Lab Results  Component Value Date   MICROALBUR 2.0 (H) 09/09/2016   LDLCALC 33 10/01/2015   CREATININE 1.12 03/17/2017      HYPOGONADISM: He was seen in consultation in 9/14 for evaluation of a low testosterone level. This was initially tested because of erectile dysfunction At that time his libido had been fairly good and also did not complain of any unusual fatigue, poor muscle endurance,  lack of motivation or depression Evaluation confirmed a low free testosterone level as well as normal prolactin. LH level was mildly increased  Because of documented hypogonadism he was started on Androderm 4 mg patch Subjectively with testosterone supplementation he previously had more energy level and mood was improved. However because of skin irritation with the patch and inconsistent adherence he was changed to AndroGel Apparently in 2015 this  was changed to [place] by his PCP   Testosterone levels: Baseline 151, after starting AndroGel, 420 in 12/14  Recent history:  With AndroGel he was using 3 pumps daily with good control but using Axiron his testosterone levels were consistently low He has been continued on clomiphene that was started in 2017  Axiron was prescribed previously but not care of this is covered and he still has not checked to see which product will be covered by his insurance He is still feeling somewhat fatigued Testosterone level is still around 200 Taking CLOMIPHENE half tablet daily    Lab Results  Component Value Date   TESTOSTERONE 208.60 (L) 03/17/2017       Allergies as of 03/20/2017      Reactions   Lisinopril Cough      Medication List       Accurate as of 03/20/17  8:20 AM. Always use your most recent med list.          amLODipine 5 MG tablet Commonly known as:  NORVASC Take 1 tablet (5 mg total) by mouth daily.   atorvastatin 10 MG tablet Commonly known as:  LIPITOR TAKE 1 TABLET BY MOUTH DAILY AT 6PM   BD PEN NEEDLE NANO U/F 32G X 4 MM Misc Generic drug:  Insulin Pen Needle USE 3 PER DAY TO INJECT INSULIN   buPROPion 300 MG 24 hr tablet Commonly known as:  WELLBUTRIN XL TAKE 1 TABLET (300 MG TOTAL) BY MOUTH DAILY.   cholecalciferol 1000 units tablet Commonly known as:  VITAMIN D Take 1,000 Units by mouth daily.   clomiPHENE 50 MG tablet Commonly known as:  CLOMID Take 0.5 tablets (25 mg total) by mouth daily.   clopidogrel 75 MG tablet Commonly known as:  PLAVIX TAKE 1 TABLET BY MOUTH DAILY   empagliflozin 25 MG Tabs tablet Commonly known as:  JARDIANCE Take 25 mg by mouth daily.   esomeprazole 10 MG packet Commonly known as:  NEXIUM Take 10 mg by mouth daily before breakfast.   glucosamine-chondroitin 500-400 MG tablet Take 1 tablet by mouth 3 (three) times daily.   ibuprofen 200 MG tablet Commonly known as:  ADVIL,MOTRIN Take 400 mg by mouth every 6 (six) hours as needed (2-3 times day).   Insulin Syringe-Needle U-100 31G X 5/16" 1 ML Misc Commonly known as:  B-D INS SYR ULTRAFINE 1CC/31G Use as directed 4-6 times daily.   methocarbamol 500 MG tablet Commonly known as:  ROBAXIN TAKE 1-2 TABLETS (500-1,000 MG TOTAL) BY MOUTH AT BEDTIME AS NEEDED FOR MUSCLE SPASMS.   NOVOLOG 100 UNIT/ML injection Generic drug:  insulin aspart INJECT 30-40 UNITS INTO THE SKIN 3 TIMES A DAY WITH MEALS BY SLIDING SCALE   ONE TOUCH ULTRA TEST test strip Generic drug:  glucose blood USE AS INSTRUCTED TO CHECK BLOOD SUGAR 3 TIMES DAILY   sitaGLIPtin-metformin 50-1000 MG tablet Commonly known as:   JANUMET Take 1 tablet by mouth 2 (two) times daily.   tadalafil 20 MG tablet Commonly known as:  CIALIS Take 1 tablet (20 mg total) by mouth daily as needed for erectile dysfunction.   Testosterone 30 MG/ACT Soln Commonly known as:  AXIRON Apply 1 pump on each armpit every morning   traZODone 100 MG tablet Commonly known as:  DESYREL TAKE 1 TABLET BY MOUTH AT BEDTIME   TRESIBA FLEXTOUCH 200 UNIT/ML Sopn Generic drug:  Insulin Degludec INJECT 100 UNITS INTO THE SKIN 2 (TWO) TIMES DAILY.       Allergies:  Allergies  Allergen Reactions  . Lisinopril Cough    Past Medical History:  Diagnosis Date  . Allergy   . Diabetes mellitus 1995  . GERD (gastroesophageal reflux disease)   . Hyperlipidemia   . Hypertension   . Sleep apnea    per patients wife  . Stroke [facility](HCC) 1993    Past Surgical History:  Procedure Laterality Date  . APPENDECTOMY      Family History  Problem Relation Age of Onset  . Diabetes Mother   . Diabetes Brother     Social History:  reports that he has been smoking Cigarettes.  He has a 22.50 pack-year smoking history. He has never used smokeless tobacco. He reports that he drinks about 1.2 oz of alcohol per week . He reports that he does not use drugs.  REVIEW of systems:    He has history of hyperlipidemia treated with Lipitor, Levels adequate  Lab Results  Component Value Date   CHOL 138 12/13/2016   HDL 39.00 (L) 12/13/2016   LDLCALC 33 10/01/2015   LDLDIRECT 33.0 12/13/2016   TRIG 206.0 (H) 12/13/2016   CHOLHDL 4 12/13/2016    History of hypertension treated with amlodipine, Followed by PCP , And this appears to be slightly high Also on Jardiance  ERECTILE dysfunction: Has been prescribed Cialis  He has mild occasional numbness in the undersurface of his  toes at times   Taking Plavix for history of stroke  LABS:   Lab on 03/17/2017  Component Date Value Ref Range Status  . Hgb A1c MFr Bld 03/17/2017 6.7* 4.6 - 6.5 % Final    Glycemic Control Guidelines for People with Diabetes:Non Diabetic:  <6%Goal of Therapy: <7%Additional Action Suggested:  >8%   . Sodium 03/17/2017 142  135 - 145 mEq/L Final  . Potassium 03/17/2017 4.4  3.5 - 5.1 mEq/L Final  . Chloride 03/17/2017 107  96 - 112 mEq/L Final  . CO2 03/17/2017 28  19 - 32 mEq/L Final  . Glucose, Bld 03/17/2017 110* 70 - 99 mg/dL Final  . BUN 16/10/960405/18/2018 16  6 - 23 mg/dL Final  . Creatinine, Ser 03/17/2017 1.12  0.40 - 1.50 mg/dL Final  . Total Bilirubin 03/17/2017 0.3  0.2 - 1.2 mg/dL Final  . Alkaline Phosphatase 03/17/2017 58  39 - 117 U/L Final  . AST 03/17/2017 26  0 - 37 U/L Final  . ALT 03/17/2017 24  0 - 53 U/L Final  . Total Protein 03/17/2017 6.7  6.0 - 8.3 g/dL Final  . Albumin 54/09/811905/18/2018 4.0  3.5 - 5.2 g/dL Final  . Calcium 14/78/295605/18/2018 9.3  8.4 - 10.5 mg/dL Final  . GFR 21/30/865705/18/2018 69.85  >60.00 mL/min Final  . Testosterone 03/17/2017 208.60* 300.00 - 890.00 ng/dL Final     General Examination:   BP 134/84   Pulse 75   Ht 5' 10.5" (1.791 m)   Wt 247 lb 12.8 oz (112.4 kg)   SpO2 97%   BMI 35.05 kg/m     Assessment/ Plan:  DIABETES type II with obesity: See history of present illness for detailed discussion of current diabetes management, blood sugar patterns and problems identified  His blood sugars are Overall well controlled with A1c below 7 On basal bolus insulin regimen, Jardiance and Janumet As discussed above he does need better management of mealtime insulin and postprandial blood sugar monitoring Diet can be more balanced especially at breakfast For counseling these topics were discussed  Recommendations:  No change in insulin  as yet  More readings after meals especially evening meal and adjust mealtime doses based on patterns after meals, may need more than 26 at suppertime if eating more carbohydrate or higher fat meal  Continue efforts to lose weight  More protein at breakfast instead of just cereal    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,  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 He has been  symptomatic with low levels of testosterone  Again his testosterone level is low with clomiphene alone  Does have persistent symptoms of mild  fatigue Discussed normal levels of testosterone which have not improved despite some improvement in weight and continuing clomiphene  He will call to find out if Axiron or AndroGel are coveredAnd let us know which one to send a prescription for   Patient Instructions  Ask if Axiron or AndroGel are covered  Check blood sugars on waking up 3/7 days   Also check blood sugars about 2 hours after a meal and do this after different meals by rotation  Recommended blood sugar levels on waking up is 90-130 and about 2 hours after meal is 130-160  Please bring your blood sugar monitor to each visit, thank you     Counseling time on subjects discussed above is over 50% of today's 25 minute visit      [facility]  03/20/2017 8:20 AM  Note: This office note was prepared with Dragon voice recognition system technology. Any transcriptional errors that result from this process are unintentional.

## 2017-03-20 ENCOUNTER — Encounter: Payer: Self-pay | Admitting: Endocrinology

## 2017-03-20 ENCOUNTER — Ambulatory Visit (INDEPENDENT_AMBULATORY_CARE_PROVIDER_SITE_OTHER): Payer: PRIVATE HEALTH INSURANCE | Admitting: Endocrinology

## 2017-03-20 VITALS — BP 134/84 | HR 75 | Ht 70.5 in | Wt 247.8 lb

## 2017-03-20 DIAGNOSIS — I10 Essential (primary) hypertension: Secondary | ICD-10-CM | POA: Diagnosis not present

## 2017-03-20 DIAGNOSIS — Z794 Long term (current) use of insulin: Secondary | ICD-10-CM

## 2017-03-20 DIAGNOSIS — E1165 Type 2 diabetes mellitus with hyperglycemia: Secondary | ICD-10-CM

## 2017-03-20 DIAGNOSIS — E291 Testicular hypofunction: Secondary | ICD-10-CM

## 2017-03-20 NOTE — Patient Instructions (Addendum)
Ask if Axiron or AndroGel are covered  Check blood sugars on waking up 3/7 days   Also check blood sugars about 2 hours after a meal and do this after different meals by rotation  Recommended blood sugar levels on waking up is 90-130 and about 2 hours after meal is 130-160  Please bring your blood sugar monitor to each visit, thank you

## 2017-03-26 ENCOUNTER — Other Ambulatory Visit: Payer: Self-pay | Admitting: Family Medicine

## 2017-04-07 ENCOUNTER — Other Ambulatory Visit: Payer: Self-pay | Admitting: Endocrinology

## 2017-04-24 ENCOUNTER — Other Ambulatory Visit: Payer: Self-pay | Admitting: Family Medicine

## 2017-04-29 ENCOUNTER — Other Ambulatory Visit: Payer: Self-pay | Admitting: Family Medicine

## 2017-05-04 ENCOUNTER — Other Ambulatory Visit: Payer: Self-pay | Admitting: Family Medicine

## 2017-06-07 ENCOUNTER — Other Ambulatory Visit: Payer: Self-pay | Admitting: Endocrinology

## 2017-06-16 ENCOUNTER — Other Ambulatory Visit (INDEPENDENT_AMBULATORY_CARE_PROVIDER_SITE_OTHER): Payer: PRIVATE HEALTH INSURANCE

## 2017-06-16 DIAGNOSIS — E291 Testicular hypofunction: Secondary | ICD-10-CM | POA: Diagnosis not present

## 2017-06-16 DIAGNOSIS — Z794 Long term (current) use of insulin: Secondary | ICD-10-CM

## 2017-06-16 DIAGNOSIS — E1165 Type 2 diabetes mellitus with hyperglycemia: Secondary | ICD-10-CM | POA: Diagnosis not present

## 2017-06-16 LAB — HEMOGLOBIN A1C: Hgb A1c MFr Bld: 6.4 % (ref 4.6–6.5)

## 2017-06-16 LAB — COMPREHENSIVE METABOLIC PANEL
ALBUMIN: 3.7 g/dL (ref 3.5–5.2)
ALK PHOS: 49 U/L (ref 39–117)
ALT: 21 U/L (ref 0–53)
AST: 26 U/L (ref 0–37)
BUN: 14 mg/dL (ref 6–23)
CALCIUM: 9.1 mg/dL (ref 8.4–10.5)
CHLORIDE: 105 meq/L (ref 96–112)
CO2: 28 mEq/L (ref 19–32)
Creatinine, Ser: 1.08 mg/dL (ref 0.40–1.50)
GFR: 72.79 mL/min (ref >60.00)
Glucose, Bld: 73 mg/dL (ref 70–99)
POTASSIUM: 4.2 meq/L (ref 3.5–5.1)
SODIUM: 141 meq/L (ref 135–145)
Total Bilirubin: 0.4 mg/dL (ref 0.2–1.2)
Total Protein: 6.3 g/dL (ref 6.0–8.3)

## 2017-06-16 LAB — TESTOSTERONE: TESTOSTERONE: 235.82 ng/dL — AB (ref 300.00–890.00)

## 2017-06-19 NOTE — Progress Notes (Signed)
Patient ID: Drew Salinas, male   DOB: Feb 27, 1951, 66 y.o.   MRN: [id number]   Chief complaint:  followup of endocrine problems    History of Present Illness:  DIABETES:  He was told to have diabetes several years ago and has been on insulin since about 2006 Previously in [place] he was taking large doses of insulin with Levemir 100 units twice a day along with Novolog 30-36 units before meals.  He prefers to take Levemir with a syringe because of needing large doses He has also been taking Janumet for a few years His A1c had been typically consistently high with this regimen He had been switched from Levemir to [facility] in 9/16  RECENT history:  INSULIN doses: TRESIBA 144 units day, Novolog 26 before meals usually  Oral hypoglycemic drugs: Jardiance 25 mg daily and Janumet 50/1000 twice a day.  His A1c is generally in the same range and now 6.7  Current management, blood sugar patterns and problems identified:  Most of his morning readings are near normal and no couple of high readings are not explainable  He forgets to check his blood sugars after meals despite instructions and has only 3 nonfasting readings  He is still not adjusting his insulin based on what he is eating  The morning routine as he is eating cereal in the morning before going to work and then he will eat the biscuit couple of hours later without any insulin  He is also not adjusting his NovoLog based on type of meal he is having in the evening, sometimes only eating soup and salad   has not been able to lose any more weight  No hypoglycemia reported  He does exercise for about 30 min 3/7 days a week while at work  Blood sugar analysis from [employer] Verio download  Mean values apply above for all meters except median for One [employer]  PRE-MEAL Fasting Lunch Dinner Bedtime Overall  Glucose range: 82-1 76    259    Mean/median: 110   135, 167   116       Last diet/ CDE consultation: 3/18  Wt  Readings from Last 3 Encounters:  06/20/17 248 lb 3.2 oz (112.6 kg)  03/20/17 247 lb 12.8 oz (112.4 kg)  01/13/17 251 lb (113.9 kg)     Lab Results  Component Value Date   HGBA1C 6.4 06/16/2017   HGBA1C 6.7 (H) 03/17/2017   HGBA1C 6.8 (H) 12/13/2016   Lab Results  Component Value Date   MICROALBUR 2.0 (H) 09/09/2016   LDLCALC 33 10/01/2015   CREATININE 1.08 06/16/2017      HYPOGONADISM: He was seen in consultation in 9/14 for evaluation of a low testosterone level. This was initially tested because of erectile dysfunction At that time his libido had been fairly good and also did not complain of any unusual fatigue, poor muscle endurance,  lack of motivation or depression Evaluation confirmed a low free testosterone level as well as normal prolactin. LH level was mildly increased  Because of documented hypogonadism he was started on Androderm 4 mg patch Subjectively with testosterone supplementation he previously had more energy level and mood was improved. However because of skin irritation with the patch and inconsistent adherence he was changed to AndroGel Apparently in 2015 this was changed to [place] by his PCP   Testosterone levels: Baseline 151, after starting AndroGel, 420 in 12/14  Recent history:  With AndroGel he was using 3 pumps daily with good  control but using Axiron his testosterone levels were consistently low He has been continued on clomiphene that was started in 2017  Axiron was prescribed  but not Covered by insurance and he is not taking this He was told to check what brand would be covered by his insurance but he still has not done so He is  feeling somewhat less fatigued and does not think that this is a problem now  Testosterone level is still around 200 Taking CLOMIPHENE half tablet daily    Lab Results  Component Value Date   TESTOSTERONE 235.82 (L) 06/16/2017      Allergies as of 06/20/2017      Reactions   Lisinopril Cough        Medication List       Accurate as of 06/20/17  8:50 AM. Always use your most recent med list.          amLODipine 5 MG tablet Commonly known as:  NORVASC TAKE 1 TABLET BY MOUTH EVERY DAY   atorvastatin 10 MG tablet Commonly known as:  LIPITOR TAKE 1 TABLET BY MOUTH DAILY AT 6PM   BD PEN NEEDLE NANO U/F 32G X 4 MM Misc Generic drug:  Insulin Pen Needle USE 3 PER DAY TO INJECT INSULIN   buPROPion 300 MG 24 hr tablet Commonly known as:  WELLBUTRIN XL TAKE 1 TABLET (300 MG TOTAL) BY MOUTH DAILY.   cholecalciferol 1000 units tablet Commonly known as:  VITAMIN D Take 1,000 Units by mouth daily.   clomiPHENE 50 MG tablet Commonly known as:  CLOMID Take 0.5 tablets (25 mg total) by mouth daily.   clopidogrel 75 MG tablet Commonly known as:  PLAVIX TAKE 1 TABLET BY MOUTH DAILY   esomeprazole 10 MG packet Commonly known as:  NEXIUM Take 10 mg by mouth daily before breakfast.   FREESTYLE LIBRE READER Devi 1 Device by Does not apply route as directed.   FREESTYLE LIBRE SENSOR SYSTEM Misc Apply to upper arm and change sensor every 10 days   glucosamine-chondroitin 500-400 MG tablet Take 1 tablet by mouth 3 (three) times daily.   ibuprofen 200 MG tablet Commonly known as:  ADVIL,MOTRIN Take 400 mg by mouth every 6 (six) hours as needed (2-3 times day).   Insulin Syringe-Needle U-100 31G X 5/16" 1 ML Misc Commonly known as:  B-D INS SYR ULTRAFINE 1CC/31G Use as directed 4-6 times daily.   JANUMET 50-1000 MG tablet Generic drug:  sitaGLIPtin-metformin TAKE 1 TABLET TWICE A DAY   JARDIANCE 25 MG Tabs tablet Generic drug:  empagliflozin TAKE 1 TABLET EVERY DAY   methocarbamol 500 MG tablet Commonly known as:  ROBAXIN TAKE 1-2 TABLETS (500-1,000 MG TOTAL) BY MOUTH AT BEDTIME AS NEEDED FOR MUSCLE SPASMS.   NOVOLOG 100 UNIT/ML injection Generic drug:  insulin aspart INJECT 30-40 UNITS INTO THE SKIN 3 TIMES A DAY WITH MEALS BY SLIDING SCALE   ONE TOUCH ULTRA TEST  test strip Generic drug:  glucose blood USE AS INSTRUCTED TO CHECK BLOOD SUGAR 3 TIMES DAILY   Testosterone 30 MG/ACT Soln Commonly known as:  AXIRON Apply 1 pump on each armpit every morning   traZODone 100 MG tablet Commonly known as:  DESYREL TAKE 1 TABLET BY MOUTH AT BEDTIME   TRESIBA FLEXTOUCH 200 UNIT/ML Sopn Generic drug:  Insulin Degludec INJECT 100 UNITS INTO THE SKIN 2 (TWO) TIMES DAILY.       Allergies:  Allergies  Allergen Reactions  . Lisinopril Cough    Past  Medical History:  Diagnosis Date  . Allergy   . Diabetes mellitus 1995  . GERD (gastroesophageal reflux disease)   . Hyperlipidemia   . Hypertension   . Sleep apnea    per patients wife  . Stroke [facility]) 1993    Past Surgical History:  Procedure Laterality Date  . APPENDECTOMY      Family History  Problem Relation Age of Onset  . Diabetes Mother   . Diabetes Brother     Social History:  reports that he has been smoking Cigarettes.  He has a 22.50 pack-year smoking history. He has never used smokeless tobacco. He reports that he drinks about 1.2 oz of alcohol per week . He reports that he does not use drugs.  REVIEW of systems:    He has history of hyperlipidemia treated with Lipitor, LDL Levels adequate  Lab Results  Component Value Date   CHOL 138 12/13/2016   HDL 39.00 (L) 12/13/2016   LDLCALC 33 10/01/2015   LDLDIRECT 33.0 12/13/2016   TRIG 206.0 (H) 12/13/2016   CHOLHDL 4 12/13/2016    History of hypertension treated with amlodipine, Followed by PCP Also on Jardiance  ERECTILE dysfunction: Has been prescribed CialisFor treatment   Taking Plavix for history of stroke  LABS:   Lab on 06/16/2017  Component Date Value Ref Range Status  . Hgb A1c MFr Bld 06/16/2017 6.4  4.6 - 6.5 % Final   Glycemic Control Guidelines for People with Diabetes:Non Diabetic:  <6%Goal of Therapy: <7%Additional Action Suggested:  >8%   . Sodium 06/16/2017 141  135 - 145 mEq/L Final  . Potassium  06/16/2017 4.2  3.5 - 5.1 mEq/L Final  . Chloride 06/16/2017 105  96 - 112 mEq/L Final  . CO2 06/16/2017 28  19 - 32 mEq/L Final  . Glucose, Bld 06/16/2017 73  70 - 99 mg/dL Final  . BUN 96/01/5408 14  6 - 23 mg/dL Final  . Creatinine, Ser 06/16/2017 1.08  0.40 - 1.50 mg/dL Final  . Total Bilirubin 06/16/2017 0.4  0.2 - 1.2 mg/dL Final  . Alkaline Phosphatase 06/16/2017 49  39 - 117 U/L Final  . AST 06/16/2017 26  0 - 37 U/L Final  . ALT 06/16/2017 21  0 - 53 U/L Final  . Total Protein 06/16/2017 6.3  6.0 - 8.3 g/dL Final  . Albumin 81/19/1478 3.7  3.5 - 5.2 g/dL Final  . Calcium 29/56/2130 9.1  8.4 - 10.5 mg/dL Final  . GFR 86/57/8469 72.79  >60.00 mL/min Final  . Testosterone 06/16/2017 235.82* 300.00 - 890.00 ng/dL Final     General Examination:   BP 134/80   Pulse 76   Ht 5' 10.5" (1.791 m)   Wt 248 lb 3.2 oz (112.6 kg)   SpO2 96%   BMI 35.11 kg/m     Assessment/ Plan:  DIABETES type II with obesity: See history of present illness for detailed discussion of current diabetes management, blood sugar patterns and problems identified  His blood sugars are Appearing well controlled with A1c 6.4 However difficult to know if he is having consistently good postprandial readings as he is monitoring mostly in the morning Occasionally has low normal fasting readings including 73 in the lab  Discussed importance of checking blood sugars after meals which he is not able to remember to do He is interested in trying the freestyle [place] system This was described to him in detail and given him information on this to review, discussed how this would  be used  Recommendations:  Reduce Evaristo Bury down to 140 units  He will need to adjust his mealtime insulin based on blood sugars after meals and hopefully can do more monitoring with or without the freestyle Libre  Blood sugar targets discussed  He may need to adjust his NovoLog for higher carbohydrate meals such as at lunch  Needs  regular exercise and  weight loss   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,  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 He has been  symptomatic with low levels of testosterone  However even though his testosterone level is below 300 he does not think he feels fatigued and does not want to try testosterone supplements He will continue taking clomiphene half tablet daily Emphasized need for weight loss to help insulin resistance syndrome  Patient Instructions  Check blood sugars on waking up  3/7 days  Also check blood sugars about 2 hours after a meal and do this after different meals by rotation  Recommended blood sugar levels on waking up is 90-130 and about 2 hours after meal is 130-160  Please bring your blood sugar monitor to each visit, thank you  Tresiba 140 units  Keep sugars after meals at goal with Novolog       Counseling time on subjects discussed above is over 50% of today's 25 minute visit      [facility]  06/20/2017 8:50 AM  Note: This office note was prepared with Insurance underwriter. Any transcriptional errors that result from this process are unintentional.

## 2017-06-20 ENCOUNTER — Encounter: Payer: Self-pay | Admitting: Endocrinology

## 2017-06-20 ENCOUNTER — Ambulatory Visit (INDEPENDENT_AMBULATORY_CARE_PROVIDER_SITE_OTHER): Payer: PRIVATE HEALTH INSURANCE | Admitting: Endocrinology

## 2017-06-20 VITALS — BP 134/80 | HR 76 | Ht 70.5 in | Wt 248.2 lb

## 2017-06-20 DIAGNOSIS — E1165 Type 2 diabetes mellitus with hyperglycemia: Secondary | ICD-10-CM

## 2017-06-20 DIAGNOSIS — Z794 Long term (current) use of insulin: Secondary | ICD-10-CM | POA: Diagnosis not present

## 2017-06-20 DIAGNOSIS — E291 Testicular hypofunction: Secondary | ICD-10-CM | POA: Diagnosis not present

## 2017-06-20 MED ORDER — FREESTYLE LIBRE READER DEVI: 1.0000 | 0 refills | Status: DC

## 2017-06-20 MED ORDER — FREESTYLE LIBRE SENSOR SYSTEM MISC: 3 refills | Status: DC

## 2017-06-20 NOTE — Patient Instructions (Addendum)
Check blood sugars on waking up  3/7 days  Also check blood sugars about 2 hours after a meal and do this after different meals by rotation  Recommended blood sugar levels on waking up is 90-130 and about 2 hours after meal is 130-160  Please bring your blood sugar monitor to each visit, thank you  Tresiba 140 units  Keep sugars after meals at goal with Novolog

## 2017-06-22 ENCOUNTER — Other Ambulatory Visit: Payer: Self-pay | Admitting: Family Medicine

## 2017-06-22 ENCOUNTER — Other Ambulatory Visit: Payer: Self-pay | Admitting: Endocrinology

## 2017-07-10 ENCOUNTER — Other Ambulatory Visit: Payer: Self-pay | Admitting: Endocrinology

## 2017-07-18 ENCOUNTER — Other Ambulatory Visit: Payer: Self-pay | Admitting: Endocrinology

## 2017-07-21 ENCOUNTER — Other Ambulatory Visit: Payer: Self-pay | Admitting: Endocrinology

## 2017-07-24 ENCOUNTER — Other Ambulatory Visit: Payer: Self-pay | Admitting: Family Medicine

## 2017-07-25 ENCOUNTER — Telehealth: Payer: Self-pay | Admitting: Endocrinology

## 2017-07-25 NOTE — Telephone Encounter (Signed)
Ext.178 Insurance carrier needs last A1C and office notes from last visit.  Please call and use ext.  Fax: [phone] (ok to fax this info.)  Ty,  -LL

## 2017-07-25 NOTE — Telephone Encounter (Signed)
I have faxed this information to the [employer] number provided.

## 2017-07-27 ENCOUNTER — Other Ambulatory Visit: Payer: Self-pay | Admitting: Family Medicine

## 2017-08-01 ENCOUNTER — Other Ambulatory Visit: Payer: Self-pay | Admitting: Family Medicine

## 2017-08-02 ENCOUNTER — Other Ambulatory Visit: Payer: Self-pay

## 2017-08-02 MED ORDER — ATORVASTATIN CALCIUM 10 MG PO TABS: ORAL_TABLET | ORAL | 1 refills | Status: DC

## 2017-08-08 ENCOUNTER — Encounter: Payer: Self-pay | Admitting: Family Medicine

## 2017-08-08 NOTE — Progress Notes (Signed)
Subjective:    Patient ID: Drew Salinas, male    DOB: 08-21-1951, 66 y.o.   MRN: [id number]   Chief Complaint  Patient presents with  . Annual Exam    (Pt states he will have Medicare in 2 months), Pt states he will get high dose flu vaccine at pharmacy    HPI  Drew Salinas is a 66 yo male here today for his complete physical exam. Last was 2 yrs prior.  Primary Preventative Screenings: Prostate Cancer: psa 2 yrs prior 0.25 <- 0.19 <- 0.2  STI screening: neg hiv and hep C 2016 Colorectal Cancer: Colonoscopy done 08/2013 by Dr. Karolee Ohs at [facility] - 10 yr recall. Tobacco use/AAA/Lung/EtOH/Illicit substances: Smoked 1/2 ppd on avg since 66 yo (~25 pack-years). Soc EtOH - 2 beers/wk. Cardiac: baseline EKG 10/01/2015 nml Weight/Blood sugar/Diet/Exercise: - DM followed by endocrine. Exercising a little. Works in the office of a Engineer, drilling.  BMI Readings from Last 3 Encounters:  06/20/17 35.11 kg/m  03/20/17 35.05 kg/m  01/13/17 35.01 kg/m   Lab Results  Component Value Date   HGBA1C 6.4 06/16/2017   OTC/Vit/Supp/Herbal: nml vit D 33 2013 - taking vit D 1000u, glucosamine-mvi, centrum mvi 50+ Dentist/Optho: sees ophho annually due to DM - [facility] Alan Mulder at [facility] - last visit 08/2016 + for diabetic retinopathy Immunizations:  Immunization History  Administered Date(s) Administered  . Influenza Split 08/17/2012  . Influenza,inj,Quad PF,6+ Mos 07/12/2013, 09/12/2014, 10/13/2015, 09/16/2016  . Influenza-Unspecified 08/31/2016  . Pneumococcal Polysaccharide-23 03/01/2011  . 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 On Janumet, Jardiance, and tresiba, mealtime novolog Hypogonadism: Followed by Dr. Lucianne Muss. Dx'd 2014 (66 yo) w/ total test 197->151 with persistently elevated LH. On testosterone supp and clomid w/ prn cialis  OSA: using the CPAP machine every night HTN: good control with Norvasc  5 HLD: GOAL LDL <70 - at goal on atorvastatin 10 when checked by endocrine 12/13/2016 w/ direct LDL 33 and non-HDL <100. Tobacco use: ongoing (wife still smoking as well)- about 1/3 ppd still??. Since 66 yo.  Advised screening lung CT at last visit. On wellbutrin XL 300 which is helping some - we discussed increasing this to 450 at his last visit last yr. H/o Arthralgias: 2 yrs ago was seeing Dr. Terrilee Files [facility]) for left shoulder (frozen -> referred to PT) and Rt wrist pain (MRI). On glucosamine-chondroitin and prn otc ibuprofen (knows to limit as much as poss). Tried prn robaxin at last visit which did help some though he has been out for a while so his nighttime leg cramps are returning.  LBP after standing for a while, intermittent.l Toes on both feet feel numb and gets better with walking, bothers more at night. Not keeping awake.   Obesity: H/o CVA: at 66 yo, on Plavix, still with occ tingling on L side. Insomnia: trazodone 100 mg qhs  Past Medical History:  Diagnosis Date  . Allergy   . Diabetes mellitus 1995  . GERD (gastroesophageal reflux disease)   . Hyperlipidemia   . Hypertension   . Sleep apnea   . Stroke [facility]) 1993   No residual deficits   Past Surgical History:  Procedure Laterality Date  . APPENDECTOMY     Current Outpatient Prescriptions on File Prior to Visit  Medication Sig Dispense Refill  . atorvastatin (LIPITOR) 10 MG tablet TAKE 1 TABLET BY MOUTH DAILY AT 6PM 90 tablet 1  .  BD PEN NEEDLE NANO U/F 32G X 4 MM MISC USE 3 PER DAY TO INJECT INSULIN 100 each 3  . cholecalciferol (VITAMIN D) 1000 units tablet Take 1,000 Units by mouth daily.    . clomiPHENE (CLOMID) 50 MG tablet TAKE 1/2 TABLET EVERY DAY 15 tablet 2  . Continuous Blood Gluc Receiver (FREESTYLE LIBRE READER) DEVI 1 Device by Does not apply route as directed. 1 Device 0  . Continuous Blood Gluc Sensor (FREESTYLE LIBRE SENSOR SYSTEM) MISC Apply to upper arm and change sensor every 10 days 3  each 3  . esomeprazole (NEXIUM) 10 MG packet Take 10 mg by mouth daily before breakfast.    . glucosamine-chondroitin 500-400 MG tablet Take 1 tablet by mouth 3 (three) times daily.    Marland Kitchen ibuprofen (ADVIL,MOTRIN) 200 MG tablet Take 400 mg by mouth every 6 (six) hours as needed (2-3 times day).    . Insulin Syringe-Needle U-100 (B-D INS SYR ULTRAFINE 1CC/31G) 31G X 5/16" 1 ML MISC Use as directed 4-6 times daily. 600 each 5  . JANUMET 50-1000 MG tablet TAKE 1 TABLET BY MOUTH 2 TIMES DAILY 60 tablet 1  . JARDIANCE 25 MG TABS tablet TAKE 1 TABLET BY MOUTH EVERY DAY 30 tablet 3  . NOVOLOG 100 UNIT/ML injection INJECT 30 TO 40 UNITS SUB CUTANEOUSLY 3 TIMES DAILY WITH MEALS PER SLIDING SCALE 30 mL 1  . TRESIBA FLEXTOUCH 200 UNIT/ML SOPN INJECT 100U SUB CUTANEOUSLY 2 TIMES DAILY 27 mL 2   No current facility-administered medications on file prior to visit.    Allergies  Allergen Reactions  . Lisinopril Cough   Family History  Problem Relation Age of Onset  . Diabetes Mother   . Diabetes Brother    Social History   Social History  . Marital status: Married    Spouse name: N/A  . Number of children: 3  . Years of education: N/A   Occupational History  . LOGISTICS SPECIALIST [employer]   Social History Main Topics  . Smoking status: Current Some Day Smoker    Packs/day: 0.50    Years: 45.00    Types: Cigarettes    Last attempt to quit: 07/05/2013  . Smokeless tobacco: Never Used     Comment: quit on 07/05/2013  . Alcohol use 1.2 oz/week    2 Cans of beer per week  . Drug use: No  . Sexual activity: Yes   Other Topics Concern  . None   Social History Narrative  . None   Depression screen [facility] 2/9 08/10/2017 01/13/2017 10/06/2016 03/31/2016 10/01/2015  Decreased Interest 0 0 0 0 0  Down, Depressed, Hopeless 0 0 0 0 0  PHQ - 2 Score 0 0 0 0 0    Review of Systems See hpi    Objective:   Physical Exam  Constitutional: He is oriented to person, place, and time. He appears  well-developed and well-nourished. No distress.  HENT:  Head: Normocephalic and atraumatic.  Right Ear: Tympanic membrane, external ear and ear canal normal.  Left Ear: Tympanic membrane, external ear and ear canal normal.  Nose: Nose normal.  Mouth/Throat: Uvula is midline, oropharynx is clear and moist and mucous membranes are normal. No oropharyngeal exudate.  Eyes: Conjunctivae are normal. Right eye exhibits no discharge. Left eye exhibits no discharge. No scleral icterus.  Neck: Normal range of motion. Neck supple. No thyromegaly present.  Cardiovascular: Normal rate, regular rhythm, normal heart sounds and intact distal pulses.   Pulmonary/Chest: Effort normal  and breath sounds normal. No respiratory distress.  Abdominal: Soft. Bowel sounds are normal. He exhibits no distension and no mass. There is no tenderness. There is no rebound and no guarding.  Genitourinary: Rectum normal. Prostate is not enlarged and not tender.  Genitourinary Comments: Erythematous rash with scale along edge mirroring gluteal crease  Musculoskeletal: He exhibits no edema.  Lymphadenopathy:    He has no cervical adenopathy.  Neurological: He is alert and oriented to person, place, and time. He has normal reflexes. No cranial nerve deficit. He exhibits normal muscle tone.  Skin: Skin is warm and dry. No rash noted. He is not diaphoretic. No erythema.  Psychiatric: He has a normal mood and affect. His behavior is normal.      BP 138/78 (BP Location: Right Arm, Patient Position: Sitting, Cuff Size: Large)   Pulse 79   Temp 98.3 F (36.8 C) (Oral)   Resp 18   Ht 5' 10.5" (1.791 m)   Wt 246 lb 3.2 oz (111.7 kg)   SpO2 95%   BMI 34.83 kg/m    Visual Acuity Screening   Right eye Left eye Both eyes  Without correction:  With correction:     Hearing Screening Comments: Pt could hear colors at 10 FT  Assessment & Plan:  Flu shot - will get high dose at pharmacy, prevnar-13. Ua, lipid,  cbc, tsh, psa Endocrine is going to do microalb at next OV, cmp was < 2 mos ago nml w/ bmp at next OV,  Lipids were at goal  Order screening lung CT. Sched WTM visit whenever starts.  Refills of: trazodone, methocarbamol, plavix, wellbutrin, amlodipine Change methocarb dose as the  is on back-order  1. Annual physical exam   2. Screening for cardiovascular, respiratory, and genitourinary diseases   3. Screening for deficiency anemia   4. Screening for prostate cancer   5. Screening for thyroid disorder   6. Essential hypertension, benign   7. Mixed hyperlipidemia   8. History of tobacco abuse   9. History of stroke without residual deficits   10. Tinea cruris   11. Tinea pedis of both feet   12. Onychomadesis of toenail     Orders Placed This Encounter  Procedures  . Pneumococcal conjugate vaccine 13-valent IM  . Lipid panel    Order Specific Question:   Has the patient fasted?    Answer:   Yes  . CBC with Differential/Platelet  . TSH  . PSA  . Ambulatory Referral for Lung Cancer Scre    Referral Priority:   Routine    Referral Type:   Consultation    Referral Reason:   Specialty Services Required    Number of Visits Requested:   1  . POCT urinalysis dipstick    Meds ordered this encounter  Medications  . traZODone (DESYREL) 100 MG tablet    Sig: TAKE 1 TABLET BY MOUTH AT BEDTIME.    Dispense:  90 tablet    Refill:  3  . methocarbamol (ROBAXIN) 750 MG tablet    Sig: Take 1-2 tablets (750-1,500 mg total) by mouth at bedtime as needed for muscle spasms.    Dispense:  180 tablet    Refill:  1  . clopidogrel (PLAVIX) 75 MG tablet    Sig: Take 1 tablet (75 mg total) by mouth daily.    Dispense:  90 tablet    Refill:  3  . amLODipine (NORVASC) 5 MG tablet    Sig: Take  1 tablet (5 mg total) by mouth daily.    Dispense:  90 tablet    Refill:  3  . buPROPion (WELLBUTRIN XL) 300 MG 24 hr tablet    Sig: Take 1 tablet (300 mg total) by mouth daily.    Dispense:  90  tablet    Refill:  3  . ketoconazole (NIZORAL) 2 % cream    Sig: Apply 1 application topically 2 (two) times daily. To groin a feet for a month    Dispense:  180 g    Refill:  2     Norberto Sorenson, M.D.  Primary Care at [facility] [address] [place], [place] [address] [phone] phone [phone] fax  08/12/17 4:54 AM

## 2017-08-10 ENCOUNTER — Ambulatory Visit (INDEPENDENT_AMBULATORY_CARE_PROVIDER_SITE_OTHER): Payer: PRIVATE HEALTH INSURANCE | Admitting: Family Medicine

## 2017-08-10 ENCOUNTER — Encounter: Payer: Self-pay | Admitting: Family Medicine

## 2017-08-10 VITALS — BP 138/78 | HR 79 | Temp 98.3°F | Resp 18 | Ht 70.5 in | Wt 246.2 lb

## 2017-08-10 DIAGNOSIS — Z8673 Personal history of transient ischemic attack (TIA), and cerebral infarction without residual deficits: Secondary | ICD-10-CM

## 2017-08-10 DIAGNOSIS — L608 Other nail disorders: Secondary | ICD-10-CM | POA: Diagnosis not present

## 2017-08-10 DIAGNOSIS — Z1389 Encounter for screening for other disorder: Secondary | ICD-10-CM

## 2017-08-10 DIAGNOSIS — E782 Mixed hyperlipidemia: Secondary | ICD-10-CM

## 2017-08-10 DIAGNOSIS — B356 Tinea cruris: Secondary | ICD-10-CM

## 2017-08-10 DIAGNOSIS — Z23 Encounter for immunization: Secondary | ICD-10-CM

## 2017-08-10 DIAGNOSIS — Z136 Encounter for screening for cardiovascular disorders: Secondary | ICD-10-CM | POA: Diagnosis not present

## 2017-08-10 DIAGNOSIS — Z125 Encounter for screening for malignant neoplasm of prostate: Secondary | ICD-10-CM | POA: Diagnosis not present

## 2017-08-10 DIAGNOSIS — Z1383 Encounter for screening for respiratory disorder NEC: Secondary | ICD-10-CM

## 2017-08-10 DIAGNOSIS — Z87891 Personal history of nicotine dependence: Secondary | ICD-10-CM

## 2017-08-10 DIAGNOSIS — Z1329 Encounter for screening for other suspected endocrine disorder: Secondary | ICD-10-CM

## 2017-08-10 DIAGNOSIS — Z130 Encounter for screening for diseases of the blood and blood-forming organs and certain disorders involving the immune mechanism: Secondary | ICD-10-CM

## 2017-08-10 DIAGNOSIS — Z0000 Encounter for general adult medical examination without abnormal findings: Secondary | ICD-10-CM

## 2017-08-10 DIAGNOSIS — I10 Essential (primary) hypertension: Secondary | ICD-10-CM | POA: Diagnosis not present

## 2017-08-10 DIAGNOSIS — B353 Tinea pedis: Secondary | ICD-10-CM | POA: Diagnosis not present

## 2017-08-10 LAB — POCT URINALYSIS DIP (MANUAL ENTRY)
BILIRUBIN UA: NEGATIVE mg/dL
Bilirubin, UA: NEGATIVE
Blood, UA: NEGATIVE
Glucose, UA: 500 mg/dL — AB
LEUKOCYTES UA: NEGATIVE
NITRITE UA: NEGATIVE
PH UA: 5.5 (ref 5.0–8.0)
PROTEIN UA: NEGATIVE mg/dL
Spec Grav, UA: 1.025 (ref 1.010–1.025)
UROBILINOGEN UA: 0.2 U/dL

## 2017-08-10 MED ORDER — KETOCONAZOLE 2 % EX CREA: 1.0000 "application " | TOPICAL_CREAM | Freq: Two times a day (BID) | CUTANEOUS | 2 refills | Status: DC

## 2017-08-10 MED ORDER — BUPROPION HCL ER (XL) 300 MG PO TB24: 300.0000 mg | ORAL_TABLET | Freq: Every day | ORAL | 3 refills | Status: DC

## 2017-08-10 MED ORDER — METHOCARBAMOL 750 MG PO TABS
750.0000 mg | ORAL_TABLET | Freq: Every evening | ORAL | 1 refills | Status: DC | PRN
Start: 2017-08-10 — End: 2018-02-07

## 2017-08-10 MED ORDER — AMLODIPINE BESYLATE 5 MG PO TABS: 5.0000 mg | ORAL_TABLET | Freq: Every day | ORAL | 3 refills | Status: DC

## 2017-08-10 MED ORDER — TRAZODONE HCL 100 MG PO TABS: ORAL_TABLET | ORAL | 3 refills | Status: DC

## 2017-08-10 MED ORDER — CLOPIDOGREL BISULFATE 75 MG PO TABS: 75.0000 mg | ORAL_TABLET | Freq: Every day | ORAL | 3 refills | Status: DC

## 2017-08-10 NOTE — Patient Instructions (Addendum)
IF you received an x-ray today, you will receive an invoice from [facility]. Please contact [facility] at [phone] with questions or concerns regarding your invoice.   IF you received labwork today, you will receive an invoice from [facility]. Please contact [facility] at [phone] with questions or concerns regarding your invoice.   Our billing staff will not be able to assist you with questions regarding bills from these companies.  You will be contacted with the lab results as soon as they are available. The fastest way to get your results is to activate your My Chart account. Instructions are located on the last page of this paperwork. If you have not heard from [place] regarding the results in 2 weeks, please contact this office.    Jock Itch Jock itch (tinea cruris) is a fungal infection of the skin in the groin area. It is sometimes called ringworm, even though it is not caused by worms. It is caused by a fungus, which is a type of germ that thrives in dark, damp places. Jock itch causes a rash and itching in the groin and upper thigh area. It usually goes away in 2-3 weeks with treatment. What are the causes? The fungus that causes jock itch may be spread by:  Touching a fungus infection elsewhere on your body-such as athlete's foot-and then touching your groin area.  Sharing towels or clothing with an infected person.  What increases the risk? Jock itch is most common in men and adolescent boys. This condition is more likely to develop from:  Being in hot, humid climates.  Wearing tight-fitting clothing or wet bathing suits for long periods of time.  Participating in sports.  Being overweight.  Having diabetes.  What are the signs or symptoms? Symptoms of jock itch may include:  A red, pink, or brown rash in the groin area. The rash may spread to the thighs, anus, and buttocks.  Dry and scaly skin on or around the rash.  Itchiness.  How is  this diagnosed? Most often, a health care provider can make the diagnosis by looking at your rash. Sometimes, a scraping of the infected skin will be taken. This sample may be tested by looking at it under a microscope or by trying to grow the fungus from the sample (culture). How is this treated? Treatment for this condition may include:  Antifungal medicine to kill the fungus. This may be in various forms: ? Skin cream or ointment. ? Medicine taken by mouth.  Skin cream or ointment to reduce the itching.  Compresses or medicated powders to dry the infected skin.  Follow these instructions at home:  Take medicines only as directed by your health care provider. Apply skin creams or ointments exactly as directed.  Wear loose-fitting clothing. ? Men should wear cotton boxer shorts. ? Women should wear cotton underwear.  Change your underwear every day to keep your groin dry.  Avoid hot baths.  Dry your groin area well after bathing. ? Use a separate towel to dry your groin area. This will help to prevent a spreading of the infection to other areas of your body.  Do not scratch the affected area.  Do not share towels with other people. Contact a health care provider if:  Your rash does not improve or it gets worse after 2 weeks of treatment.  Your rash is spreading.  Your rash returns after treatment is finished.  You have a fever.  You have redness, swelling, or  pain in the area around your rash.  You have fluid, blood, or pus coming from your rash.  Your have your rash for more than 4 weeks. This information is not intended to replace advice given to you by your health care provider. Make sure you discuss any questions you have with your health care provider. Document Released: 10/07/2002 Document Revised: 03/24/2016 Document Reviewed: 07/29/2014 Elsevier Interactive Patient Education  2018 [employer]  Athlete's Foot Athlete's foot (tinea pedis) is a fungal  infection of the skin on the feet. It often occurs on the skin that is between or underneath the toes. It can also occur on the soles of the feet. The infection can spread from person to person (is contagious). What are the causes? Athlete's foot is caused by a fungus. This fungus grows in warm, moist places. Most people get athlete's foot by sharing shower stalls, towels, and wet floors with someone who is infected. Not washing your feet or changing your socks often enough can contribute to athlete's foot. What increases the risk? This condition is more likely to develop in:  Men.  People who have a weak body defense system (immune system).  People who have diabetes.  People who use public showers, such as at a gym.  People who wear heavy-duty shoes, such as Youth worker.  Seasons with warm, humid weather.  What are the signs or symptoms? Symptoms of this condition include:  Itchy areas between the toes or on the soles of the feet.  White, flaky, or scaly areas between the toes or on the soles of the feet.  Very itchy small blisters between the toes or on the soles of the feet.  Small cuts on the skin. These cuts can become infected.  Thick or discolored toenails.  How is this diagnosed? This condition is diagnosed with a medical history and physical exam. Your health care provider may also take a skin or toenail sample to be examined. How is this treated? Treatment for this condition includes antifungal medicines. These may be applied as powders, ointments, or creams. In severe cases, an oral antifungal medicine may be given. Follow these instructions at home:  Apply or take over-the-counter and prescription medicines only as told by your health care provider.  Keep all follow-up visits as told by your health care provider. This is important.  Do not scratch your feet.  Keep your feet dry: ? Wear cotton or wool socks. Change your socks every day or if  they become wet. ? Wear shoes that allow air to circulate, such as sandals or canvas tennis shoes.  Wash and dry your feet: ? Every day or as told by your health care provider. ? After exercising. ? Including the area between your toes.  Do not share towels, nail clippers, or other personal items that touch your feet with others.  If you have diabetes, keep your blood sugar under control. How is this prevented?  Do not share towels.  Wear sandals in wet areas, such as locker rooms and shared showers.  Keep your feet dry: ? Wear cotton or wool socks. Change your socks every day or if they become wet. ? Wear shoes that allow air to circulate, such as sandals or canvas tennis shoes.  Wash and dry your feet after exercising. Pay attention to the area between your toes. Contact a health care provider if:  You have a fever.  You have swelling, soreness, warmth, or redness in your foot.  You  are not getting better with treatment.  Your symptoms get worse.  You have new symptoms. This information is not intended to replace advice given to you by your health care provider. Make sure you discuss any questions you have with your health care provider. Document Released: 10/14/2000 Document Revised: 03/24/2016 Document Reviewed: 04/20/2015 Elsevier Interactive Patient Education  2018 [employer].

## 2017-08-11 ENCOUNTER — Ambulatory Visit: Payer: PRIVATE HEALTH INSURANCE | Admitting: Family Medicine

## 2017-08-11 LAB — CBC WITH DIFFERENTIAL/PLATELET
Basophils Absolute: 0 x10E3/uL (ref 0.0–0.2)
Basos: 0 %
EOS (ABSOLUTE): 0.3 x10E3/uL (ref 0.0–0.4)
Eos: 3 %
Hematocrit: 40.8 % (ref 37.5–51.0)
Hemoglobin: 14 g/dL (ref 13.0–17.7)
Immature Grans (Abs): 0 x10E3/uL (ref 0.0–0.1)
Immature Granulocytes: 0 %
Lymphocytes Absolute: 2.2 x10E3/uL (ref 0.7–3.1)
Lymphs: 23 %
MCH: 32.3 pg (ref 26.6–33.0)
MCHC: 34.3 g/dL (ref 31.5–35.7)
MCV: 94 fL (ref 79–97)
Monocytes Absolute: 0.7 x10E3/uL (ref 0.1–0.9)
Monocytes: 8 %
Neutrophils Absolute: 6.5 x10E3/uL (ref 1.4–7.0)
Neutrophils: 66 %
Platelets: 167 x10E3/uL (ref 150–379)
RBC: 4.34 x10E6/uL (ref 4.14–5.80)
RDW: 13.9 % (ref 12.3–15.4)
WBC: 9.8 x10E3/uL (ref 3.4–10.8)

## 2017-08-11 LAB — LIPID PANEL
CHOLESTEROL TOTAL: 146 mg/dL (ref 100–199)
Chol/HDL Ratio: 3 ratio (ref 0.0–5.0)
HDL: 49 mg/dL (ref >39)
LDL Calculated: 53 mg/dL (ref 0–99)
Triglycerides: 218 mg/dL — ABNORMAL HIGH (ref 0–149)
VLDL CHOLESTEROL CAL: 44 mg/dL — AB (ref 5–40)

## 2017-08-11 LAB — PSA: Prostate Specific Ag, Serum: 0.2 ng/mL (ref 0.0–4.0)

## 2017-08-11 LAB — TSH: TSH: 2.22 u[IU]/mL (ref 0.450–4.500)

## 2017-08-13 ENCOUNTER — Other Ambulatory Visit: Payer: Self-pay | Admitting: Endocrinology

## 2017-08-14 ENCOUNTER — Other Ambulatory Visit: Payer: Self-pay | Admitting: Acute Care

## 2017-08-14 DIAGNOSIS — Z87891 Personal history of nicotine dependence: Secondary | ICD-10-CM

## 2017-08-14 DIAGNOSIS — Z122 Encounter for screening for malignant neoplasm of respiratory organs: Secondary | ICD-10-CM

## 2017-08-18 ENCOUNTER — Other Ambulatory Visit: Payer: Self-pay | Admitting: Endocrinology

## 2017-08-24 ENCOUNTER — Encounter: Payer: Self-pay | Admitting: Radiology

## 2017-08-30 ENCOUNTER — Encounter: Payer: PRIVATE HEALTH INSURANCE | Admitting: Acute Care

## 2017-08-30 ENCOUNTER — Inpatient Hospital Stay: Admission: RE | Admit: 2017-08-30 | Payer: PRIVATE HEALTH INSURANCE | Source: Ambulatory Visit

## 2017-09-11 ENCOUNTER — Ambulatory Visit (INDEPENDENT_AMBULATORY_CARE_PROVIDER_SITE_OTHER): Payer: PRIVATE HEALTH INSURANCE | Admitting: Acute Care

## 2017-09-11 ENCOUNTER — Ambulatory Visit (INDEPENDENT_AMBULATORY_CARE_PROVIDER_SITE_OTHER)
Admission: RE | Admit: 2017-09-11 | Discharge: 2017-09-11 | Disposition: A | Discharge status: Home / Self Care | Payer: PRIVATE HEALTH INSURANCE | Source: Ambulatory Visit | Attending: Acute Care | Admitting: Acute Care

## 2017-09-11 ENCOUNTER — Encounter: Payer: Self-pay | Admitting: Acute Care

## 2017-09-11 ENCOUNTER — Encounter: Payer: PRIVATE HEALTH INSURANCE | Admitting: Acute Care

## 2017-09-11 DIAGNOSIS — Z87891 Personal history of nicotine dependence: Secondary | ICD-10-CM | POA: Diagnosis not present

## 2017-09-11 DIAGNOSIS — Z122 Encounter for screening for malignant neoplasm of respiratory organs: Secondary | ICD-10-CM

## 2017-09-11 NOTE — Progress Notes (Signed)
Shared Decision Making Visit Lung Cancer Screening Program [phone]([id number])   Eligibility:  Age 66 y.o.  Pack Years Smoking History Calculation 36 pack year smoking history (# packs/per year x # years smoked)  Recent History of coughing up blood  no  Unexplained weight loss? no ( >Than 15 pounds within the last 6 months )  Prior History Lung / other cancer no (Diagnosis within the last 5 years already requiring surveillance chest CT Scans).  Smoking Status Former Smoker  Former Smokers: Years since quit: <1 year  Quit Date: 03/2017  Visit Components:  Discussion included one or more decision making aids. yes  Discussion included risk/benefits of screening. yes  Discussion included potential follow up diagnostic testing for abnormal scans. yes  Discussion included meaning and risk of over diagnosis. yes  Discussion included meaning and risk of False Positives. yes  Discussion included meaning of total radiation exposure. yes  Counseling Included:  Importance of adherence to annual lung cancer LDCT screening. yes  Impact of comorbidities on ability to participate in the program. yes  Ability and willingness to under diagnostic treatment. yes  Smoking Cessation Counseling:  Current Smokers:   Discussed importance of smoking cessation. NA  Information about tobacco cessation classes and interventions provided to patient. yes  Patient provided with "ticket" for LDCT Scan. yes  Symptomatic Patient. no  Counseling  Diagnosis Code: Tobacco Use Z72.0  Asymptomatic Patient yes  Counseling (Intermediate counseling: > three minutes counseling) [id number]  Former Smokers:   Discussed the importance of maintaining cigarette abstinence. yes  Diagnosis Code: Personal History of Nicotine Dependence. U98.119Z87.891  Information about tobacco cessation classes and interventions provided to patient. Yes  Patient provided with "ticket" for LDCT Scan. yes  Written Order for Lung Cancer  Screening with LDCT placed in Epic. Yes (CT Chest Lung Cancer Screening Low Dose W/O CM) [id number] Z12.2-Screening of respiratory organs Z87.891-Personal history of nicotine dependence  I spent 25 minutes of face to face time with [name] discussing the risks and benefits of lung cancer screening. We viewed a power point together that explained in detail the above noted topics. We took the time to pause the power point at intervals to allow for questions to be asked and answered to ensure understanding. We discussed that he had taken the single most powerful action possible to decrease his risk of developing lung cancer when he quit smoking. I counseled [name] to remain smoke free, and to contact me if he  ever had the desire to smoke again so that I can provide resources and tools to help support the effort to remain smoke free. We discussed the time and location of the scan, and that either  Abigail Miyamotoenise Phelps RN or I will call with the results within  24-48 hours of receiving them. [name] has my card and contact information in the event he needs to speak with me, in addition to a copy of the power point we reviewed as a resource. He  verbalized understanding of all of the above and had no further questions upon leaving the office.     I explained to the patient that there has been a high incidence of coronary artery disease noted on these exams. I explained that this is a non-gated exam therefore degree or severity cannot be determined. This patient is currently on statin therapy. I have asked the patient to follow-up with their PCP regarding any incidental finding of coronary artery disease and management with diet or medication as  they feel is clinically indicated. The patient verbalized understanding of the above and had no further questions.      Bevelyn NgoSarah F Groce, NP 09/11/2017

## 2017-09-13 ENCOUNTER — Ambulatory Visit (INDEPENDENT_AMBULATORY_CARE_PROVIDER_SITE_OTHER): Payer: PRIVATE HEALTH INSURANCE | Admitting: Ophthalmology

## 2017-09-13 DIAGNOSIS — H35033 Hypertensive retinopathy, bilateral: Secondary | ICD-10-CM | POA: Diagnosis not present

## 2017-09-13 DIAGNOSIS — E11319 Type 2 diabetes mellitus with unspecified diabetic retinopathy without macular edema: Secondary | ICD-10-CM

## 2017-09-13 DIAGNOSIS — H43813 Vitreous degeneration, bilateral: Secondary | ICD-10-CM

## 2017-09-13 DIAGNOSIS — H2513 Age-related nuclear cataract, bilateral: Secondary | ICD-10-CM | POA: Diagnosis not present

## 2017-09-13 DIAGNOSIS — E113293 Type 2 diabetes mellitus with mild nonproliferative diabetic retinopathy without macular edema, bilateral: Secondary | ICD-10-CM | POA: Diagnosis not present

## 2017-09-13 DIAGNOSIS — I10 Essential (primary) hypertension: Secondary | ICD-10-CM | POA: Diagnosis not present

## 2017-09-13 LAB — HM DIABETES EYE EXAM

## 2017-09-15 ENCOUNTER — Other Ambulatory Visit: Payer: Self-pay | Admitting: Acute Care

## 2017-09-15 DIAGNOSIS — Z87891 Personal history of nicotine dependence: Secondary | ICD-10-CM

## 2017-09-15 DIAGNOSIS — Z122 Encounter for screening for malignant neoplasm of respiratory organs: Secondary | ICD-10-CM

## 2017-09-25 ENCOUNTER — Other Ambulatory Visit (INDEPENDENT_AMBULATORY_CARE_PROVIDER_SITE_OTHER): Payer: PRIVATE HEALTH INSURANCE

## 2017-09-25 ENCOUNTER — Ambulatory Visit: Payer: PRIVATE HEALTH INSURANCE | Admitting: Endocrinology

## 2017-09-25 DIAGNOSIS — E1165 Type 2 diabetes mellitus with hyperglycemia: Secondary | ICD-10-CM | POA: Diagnosis not present

## 2017-09-25 DIAGNOSIS — E291 Testicular hypofunction: Secondary | ICD-10-CM

## 2017-09-25 DIAGNOSIS — Z794 Long term (current) use of insulin: Secondary | ICD-10-CM | POA: Diagnosis not present

## 2017-09-25 LAB — BASIC METABOLIC PANEL
BUN: 15 mg/dL (ref 6–23)
CHLORIDE: 105 meq/L (ref 96–112)
CO2: 27 meq/L (ref 19–32)
CREATININE: 1.16 mg/dL (ref 0.40–1.50)
Calcium: 9.3 mg/dL (ref 8.4–10.5)
GFR: 66.97 mL/min (ref >60.00)
Glucose, Bld: 118 mg/dL — ABNORMAL HIGH (ref 70–99)
POTASSIUM: 4.1 meq/L (ref 3.5–5.1)
Sodium: 141 mEq/L (ref 135–145)

## 2017-09-25 LAB — MICROALBUMIN / CREATININE URINE RATIO
Creatinine,U: 134.6 mg/dL
Microalb Creat Ratio: 1.9 mg/g (ref 0.0–30.0)
Microalb, Ur: 2.6 mg/dL — ABNORMAL HIGH (ref 0.0–1.9)

## 2017-09-25 LAB — HEMOGLOBIN A1C: Hgb A1c MFr Bld: 6.7 % — ABNORMAL HIGH (ref 4.6–6.5)

## 2017-09-28 ENCOUNTER — Encounter: Payer: Self-pay | Admitting: Endocrinology

## 2017-09-28 ENCOUNTER — Ambulatory Visit: Payer: PRIVATE HEALTH INSURANCE | Admitting: Endocrinology

## 2017-09-28 VITALS — BP 130/88 | HR 71 | Ht 70.5 in | Wt 251.6 lb

## 2017-09-28 DIAGNOSIS — Z23 Encounter for immunization: Secondary | ICD-10-CM

## 2017-09-28 DIAGNOSIS — Z794 Long term (current) use of insulin: Secondary | ICD-10-CM

## 2017-09-28 DIAGNOSIS — E291 Testicular hypofunction: Secondary | ICD-10-CM

## 2017-09-28 DIAGNOSIS — E1165 Type 2 diabetes mellitus with hyperglycemia: Secondary | ICD-10-CM | POA: Diagnosis not present

## 2017-09-28 LAB — TESTOSTERONE, TOTAL, LC/MS/MS: Testosterone, total: 272.2 ng/dL (ref 264.0–916.0)

## 2017-09-28 NOTE — Progress Notes (Signed)
Patient ID: Drew Salinas, male   DOB: 10/04/1951, 66 y.o.   MRN: [id number]   Chief complaint:  followup of endocrine problems    History of Present Illness:  DIABETES:  He was told to have diabetes several years ago and has been on insulin since about 2006 Previously in [place] he was taking large doses of insulin with Levemir 100 units twice a day along with Novolog 30-36 units before meals.  He prefers to take Levemir with a syringe because of needing large doses He has also been taking Janumet for a few years His A1c had been typically consistently high with this regimen He had been [facility]  RECENT history:  INSULIN doses: TRESIBA 144 units day, Novolog 24 before meals usually  Oral hypoglycemic drugs: Jardiance 25 mg daily and Janumet 50/1000 twice a day.  His A1c is generally in the same range and now 6.7  Current management, blood sugar patterns and problems identified:  He is now using the freestyle Libre sensor  However he is checking his blood sugar mostly in the mornings and difficult to know what his blood sugars are the rest of the day, does not understand the need to scan his sensor every 8 hours  Even with reducing his Tresiba by 4 units his fasting readings are still about the same  He does however have a few readings at bedtime which are consistently high  He does not look his blood sugars at night and does not adjust his mealtime doses based on blood sugar patterns or what he is eating  In fact he is taking couple of  units of Novolog less than before  No hypoglycemia reported  He tries to exercise for about 30 min 3/7 days a week while at work, probably not regularly as he has gained a little weight  Blood sugar analysis from freestyle [employer] download  Mean values apply above for all meters except median for One Touch  PRE-MEAL Fasting Lunch Dinner Bedtime Overall  Glucose range:  77-1 25       Mean/median: 112    167  215  129      Last diet/ CDE consultation: 3/18  Wt Readings from Last 3 Encounters:  09/28/17 251 lb 9.6 oz (114.1 kg)  08/10/17 246 lb 3.2 oz (111.7 kg)  06/20/17 248 lb 3.2 oz (112.6 kg)     Lab Results  Component Value Date   HGBA1C 6.7 (H) 09/25/2017   HGBA1C 6.4 06/16/2017   HGBA1C 6.7 (H) 03/17/2017   Lab Results  Component Value Date   MICROALBUR 2.6 (H) 09/25/2017   LDLCALC 53 08/10/2017   CREATININE 1.16 09/25/2017      HYPOGONADISM: He was seen in consultation in 9/14 for evaluation of a low testosterone level. This was initially tested because of erectile dysfunction At that time his libido had been fairly good and also did not complain of any unusual fatigue, poor muscle endurance,  lack of motivation or depression Evaluation confirmed a low free testosterone level as well as normal prolactin. LH level was mildly increased  Because of documented hypogonadism he was started on Androderm 4 mg patch Subjectively with testosterone supplementation he previously had more energy level and mood was improved. However because of skin irritation with the patch and inconsistent adherence he was changed to AndroGel Apparently in 2015 this was changed to [place] by his PCP   Testosterone levels: Baseline 151, after starting AndroGel, 420 in  12/14  Recent history:  With AndroGel he was using 3 pumps daily with good control but using Axiron his testosterone levels were consistently low He has been continued on clomiphene that was started in 2017  Taking CLOMIPHENE half tablet daily Testosterone level is still relatively low but somewhat better than before from a different lab Does not complain of any unusual fatigue now   Lab Results  Component Value Date   TESTOSTERONE 272.2 09/25/2017      Allergies as of 09/28/2017      Reactions   Lisinopril Cough      Medication List        Accurate as of 09/28/17  8:24 AM. Always use your most recent med  list.          amLODipine 5 MG tablet Commonly known as:  NORVASC Take 1 tablet (5 mg total) by mouth daily.   atorvastatin 10 MG tablet Commonly known as:  LIPITOR TAKE 1 TABLET BY MOUTH DAILY AT 6PM   BD PEN NEEDLE NANO U/F 32G X 4 MM Misc Generic drug:  Insulin Pen Needle USE 3 PER DAY TO INJECT INSULIN   buPROPion 300 MG 24 hr tablet Commonly known as:  WELLBUTRIN XL Take 1 tablet (300 mg total) by mouth daily.   cholecalciferol 1000 units tablet Commonly known as:  VITAMIN D Take 1,000 Units by mouth daily.   clomiPHENE 50 MG tablet Commonly known as:  CLOMID TAKE 1/2 TABLET EVERY DAY   clopidogrel 75 MG tablet Commonly known as:  PLAVIX Take 1 tablet (75 mg total) by mouth daily.   FREESTYLE LIBRE READER Devi 1 Device by Does not apply route as directed.   FREESTYLE LIBRE SENSOR SYSTEM Misc Apply to upper arm and change sensor every 10 days   glucosamine-chondroitin 500-400 MG tablet Take 1 tablet by mouth 3 (three) times daily.   ibuprofen 200 MG tablet Commonly known as:  ADVIL,MOTRIN Take 400 mg by mouth every 6 (six) hours as needed (2-3 times day).   Insulin Syringe-Needle U-100 31G X 5/16" 1 ML Misc Commonly known as:  B-D INS SYR ULTRAFINE 1CC/31G Use as directed 4-6 times daily.   JANUMET 50-1000 MG tablet Generic drug:  sitaGLIPtin-metformin TAKE 1 TABLET BY MOUTH 2 TIMES DAILY   JARDIANCE 25 MG Tabs tablet Generic drug:  empagliflozin TAKE 1 TABLET BY MOUTH EVERY DAY   ketoconazole 2 % cream Commonly known as:  NIZORAL Apply 1 application topically 2 (two) times daily. To groin a feet for a month   methocarbamol 750 MG tablet Commonly known as:  ROBAXIN Take 1-2 tablets (750-1,500 mg total) by mouth at bedtime as needed for muscle spasms.   NOVOLOG 100 UNIT/ML injection Generic drug:  insulin aspart INJECT 30 TO 40 UNITS SUB CUTANEOUSLY 3 TIMES DAILY WITH MEALS PER SLIDING SCALE   omeprazole 10 MG capsule Commonly known as:   PRILOSEC Take 10 mg by mouth daily.   traZODone 100 MG tablet Commonly known as:  DESYREL TAKE 1 TABLET BY MOUTH AT BEDTIME.   TRESIBA FLEXTOUCH 200 UNIT/ML Sopn Generic drug:  Insulin Degludec INJECT 100U SUB CUTANEOUSLY 2 TIMES DAILY       Allergies:  Allergies  Allergen Reactions  . Lisinopril Cough    Past Medical History:  Diagnosis Date  . Allergy   . Diabetes mellitus 1995  . GERD (gastroesophageal reflux disease)   . Hyperlipidemia   . Hypertension   . Sleep apnea   . Stroke [facility](HCC) [phone]  No residual deficits    Past Surgical History:  Procedure Laterality Date  . APPENDECTOMY      Family History  Problem Relation Age of Onset  . Diabetes Mother   . Diabetes Brother     Social History:  reports that he quit smoking about 5 months ago. His smoking use included cigarettes. He has a 36.75 pack-year smoking history. he has never used smokeless tobacco. He reports that he drinks about 1.2 oz of alcohol per week. He reports that he does not use drugs.  REVIEW of systems:    He has history of hyperlipidemia treated with Lipitor 10 mg, LDL Levels adequate  Lab Results  Component Value Date   CHOL 146 08/10/2017   HDL 49 08/10/2017   LDLCALC 53 08/10/2017   LDLDIRECT 33.0 12/13/2016   TRIG 218 (H) 08/10/2017   CHOLHDL 3.0 08/10/2017    History of hypertension treated with amlodipine, Followed by PCP Also on Jardiance, blood pressure appears to be variable  BP Readings from Last 3 Encounters:  09/28/17 130/88  08/10/17 138/78  06/20/17 134/80     ERECTILE dysfunction: Has been prescribed Cialis   Taking Plavix for history of stroke  LABS:   Abstract on 09/25/2017  Component Date Value Ref Range Status  . HM Diabetic Eye Exam 09/13/2017 Retinopathy* No Retinopathy Final   [facility]  Lab on 09/25/2017  Component Date Value Ref Range Status  . Testosterone, total 09/25/2017 272.2  264.0 - 916.0 ng/dL Final    Comment: This [facility] LC/MS-MS method is currently certified by the [employer] Program ([employer]). Adult male reference interval is based on a population of healthy nonobese males (BMI <30) between 79 and 57 years old. Travison, et.al. JCEM [phone]. PMID: [id number]. This test was developed and its performance characteristics determined by [facility]. It has not been cleared or approved by the Food and Drug Administration.   Alyson Reedy, Ur 09/25/2017 2.6* 0.0 - 1.9 mg/dL Final  . Creatinine,U 62/13/0865 134.6  mg/dL Final  . Microalb Creat Ratio 09/25/2017 1.9  0.0 - 30.0 mg/g Final  . Sodium 09/25/2017 141  135 - 145 mEq/L Final  . Potassium 09/25/2017 4.1  3.5 - 5.1 mEq/L Final  . Chloride 09/25/2017 105  96 - 112 mEq/L Final  . CO2 09/25/2017 27  19 - 32 mEq/L Final  . Glucose, Bld 09/25/2017 118* 70 - 99 mg/dL Final  . BUN 78/46/9629 15  6 - 23 mg/dL Final  . Creatinine, Ser 09/25/2017 1.16  0.40 - 1.50 mg/dL Final  . Calcium 52/84/1324 9.3  8.4 - 10.5 mg/dL Final  . GFR 40/07/2724 66.97  >60.00 mL/min Final  . Hgb A1c MFr Bld 09/25/2017 6.7* 4.6 - 6.5 % Final   Glycemic Control Guidelines for People with Diabetes:Non Diabetic:  <6%Goal of Therapy: <7%Additional Action Suggested:  >8%   Office Visit on 08/10/2017  Component Date Value Ref Range Status  . Cholesterol, Total 08/10/2017 146  100 - 199 mg/dL Final  . Triglycerides 08/10/2017 218* 0 - 149 mg/dL Final  . HDL 36/64/4034 49  >39 mg/dL Final   Comment: **Effective August 21, 2017, HDL Cholesterol**   reference interval will be changing to:                                   Male        Male  40 - [id number]   50 - [id number]   . VLDL Cholesterol Cal 08/10/2017 44* 5 - 40 mg/dL Final  . LDL Calculated 08/10/2017 53  0 - 99 mg/dL Final  . Chol/HDL Ratio 08/10/2017 3.0  0.0 - 5.0 ratio Final   Comment:                                   T. Chol/HDL Ratio                                              Men  Women                               1/2 Avg.Risk  3.4    3.3                                   Avg.Risk  5.0    4.4                                2X Avg.Risk  9.6    7.1                                3X Avg.Risk 23.4   11.0   . WBC 08/10/2017 9.8  3.4 - 10.8 x10E3/uL Final  . RBC 08/10/2017 4.34  4.14 - 5.80 x10E6/uL Final  . Hemoglobin 08/10/2017 14.0  13.0 - 17.7 g/dL Final  . Hematocrit 16/08/9603 40.8  37.5 - 51.0 % Final  . MCV 08/10/2017 94  79 - 97 fL Final  . MCH 08/10/2017 32.3  26.6 - 33.0 pg Final  . MCHC 08/10/2017 34.3  31.5 - 35.7 g/dL Final  . RDW 54/07/8118 13.9  12.3 - 15.4 % Final  . Platelets 08/10/2017 167  150 - 379 x10E3/uL Final  . Neutrophils 08/10/2017 66  Not Estab. % Final  . Lymphs 08/10/2017 23  Not Estab. % Final  . Monocytes 08/10/2017 8  Not Estab. % Final  . Eos 08/10/2017 3  Not Estab. % Final  . Basos 08/10/2017 0  Not Estab. % Final  . Neutrophils Absolute 08/10/2017 6.5  1.4 - 7.0 x10E3/uL Final  . Lymphocytes Absolute 08/10/2017 2.2  0.7 - 3.1 x10E3/uL Final  . Monocytes Absolute 08/10/2017 0.7  0.1 - 0.9 x10E3/uL Final  . EOS (ABSOLUTE) 08/10/2017 0.3  0.0 - 0.4 x10E3/uL Final  . Basophils Absolute 08/10/2017 0.0  0.0 - 0.2 x10E3/uL Final  . Immature Granulocytes 08/10/2017 0  Not Estab. % Final  . Immature Grans (Abs) 08/10/2017 0.0  0.0 - 0.1 x10E3/uL Final  . TSH 08/10/2017 2.220  0.450 - 4.500 uIU/mL Final  . Prostate Specific Ag, Serum 08/10/2017 0.2  0.0 - 4.0 ng/mL Final   Comment: Roche ECLIA methodology. According to the American Urological Association, Serum PSA should decrease and remain at undetectable levels after radical prostatectomy. The AUA defines biochemical recurrence as an initial PSA value 0.2 ng/mL or greater followed by a subsequent confirmatory PSA value 0.2 ng/mL or greater. Values obtained with different  assay methods or kits cannot be used interchangeably. Results cannot be interpreted as  absolute evidence of the presence or absence of malignant disease.   . Color, UA 08/10/2017 yellow  yellow Final  . Clarity, UA 08/10/2017 clear  clear Final  . Glucose, UA 08/10/2017 =500* negative mg/dL Final  . Bilirubin, UA 08/10/2017 negative  negative Final  . Ketones, POC UA 08/10/2017 negative  negative mg/dL Final  . Spec Grav, UA 08/10/2017 1.025  1.010 - 1.025 Final  . Blood, UA 08/10/2017 negative  negative Final  . pH, UA 08/10/2017 5.5  5.0 - 8.0 Final  . Protein Ur, POC 08/10/2017 negative  negative mg/dL Final  . Urobilinogen, UA 08/10/2017 0.2  0.2 or 1.0 E.U./dL Final  . Nitrite, UA 16/08/9603 Negative  Negative Final  . Leukocytes, UA 08/10/2017 Negative  Negative Final     General Examination:   BP 130/88   Pulse 71   Ht 5' 10.5" (1.791 m)   Wt 251 lb 9.6 oz (114.1 kg)   SpO2 97%   BMI 35.59 kg/m     Assessment/ Plan:  DIABETES type II with obesity: See history of present illness for detailed discussion of current diabetes management, blood sugar patterns and problems identified Blood sugar patterns were analyzed on the freestyle [employer] and discussed with patient in detail  His blood sugars are overall fairly well controlled with A1c 6.7 but he is having post prandial hyperglycemia Now with using the freestyle Josephine Igo appears that he is getting excellent fasting readings but probably consistently high readings after supper He has not monitored his blood sugar as directed 3 times a day and mostly in the morning and discussed how the freestyle Josephine Igo keeps a memory of his readings for 8 hours only Discussed differences between basal and bolus insulin Discussed need to adjust NovoLog based on postprandial blood sugar patterns and meal size He does need to watch his diet and exercise regularly is weight is going up   Recommendations:  Reduce Tresiba down to 136 units  He will need to take at least 4 units more for her suppertime Novolog  He will  try to adjust his mealtime insulin based on blood sugars after meals and meal size  He should be over to adjust his dose better after looking at his blood sugars after breakfast and lunch also  Blood sugar targets discussed  Continue Jardiance   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,  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 He has been  symptomatic with low levels of testosterone  However even though his testosterone level is below 300 consistently he does not think he feels fatigued Did not have much success with testosterone supplements and had difficulty with insurance also He will continue taking clomiphene half tablet daily but will try to take this in the evening instead of morning    Patient Instructions  Take 136 Tresiba and 28-30 Novolog at dinner  Clomiphene in pm  Check 3x daily    Counseling time on subjects discussed in assessment and plan sections is over 50% of today's 25 minute visit      [facility]  09/28/2017 8:24 AM  Note: This office note was prepared with Dragon voice recognition system technology. Any transcriptional errors that result from this process are unintentional.

## 2017-09-28 NOTE — Patient Instructions (Addendum)
Take 136 Tresiba and 28-30 Novolog at dinner  Clomiphene in pm  Check 3x daily  Exercise 5/7

## 2017-10-06 ENCOUNTER — Other Ambulatory Visit: Payer: Self-pay

## 2017-10-06 MED ORDER — EMPAGLIFLOZIN 25 MG PO TABS
25.0000 mg | ORAL_TABLET | Freq: Every day | ORAL | 3 refills | Status: DC
Start: 2017-10-06 — End: 2017-12-19

## 2017-10-06 MED ORDER — SITAGLIPTIN PHOS-METFORMIN HCL 50-1000 MG PO TABS: 1.0000 | ORAL_TABLET | Freq: Two times a day (BID) | ORAL | 1 refills | Status: DC

## 2017-10-21 IMAGING — DX DG SHOULDER 2+V*L*
3 series · 3 of 3 positions shown · non-contrast
Comparison: Chest x-ray 04/18/2014.

CLINICAL DATA: Pain for 6 weeks. No known injury. Initial
evaluation.

EXAM:
LEFT SHOULDER - 2+ VIEW

[grashey]
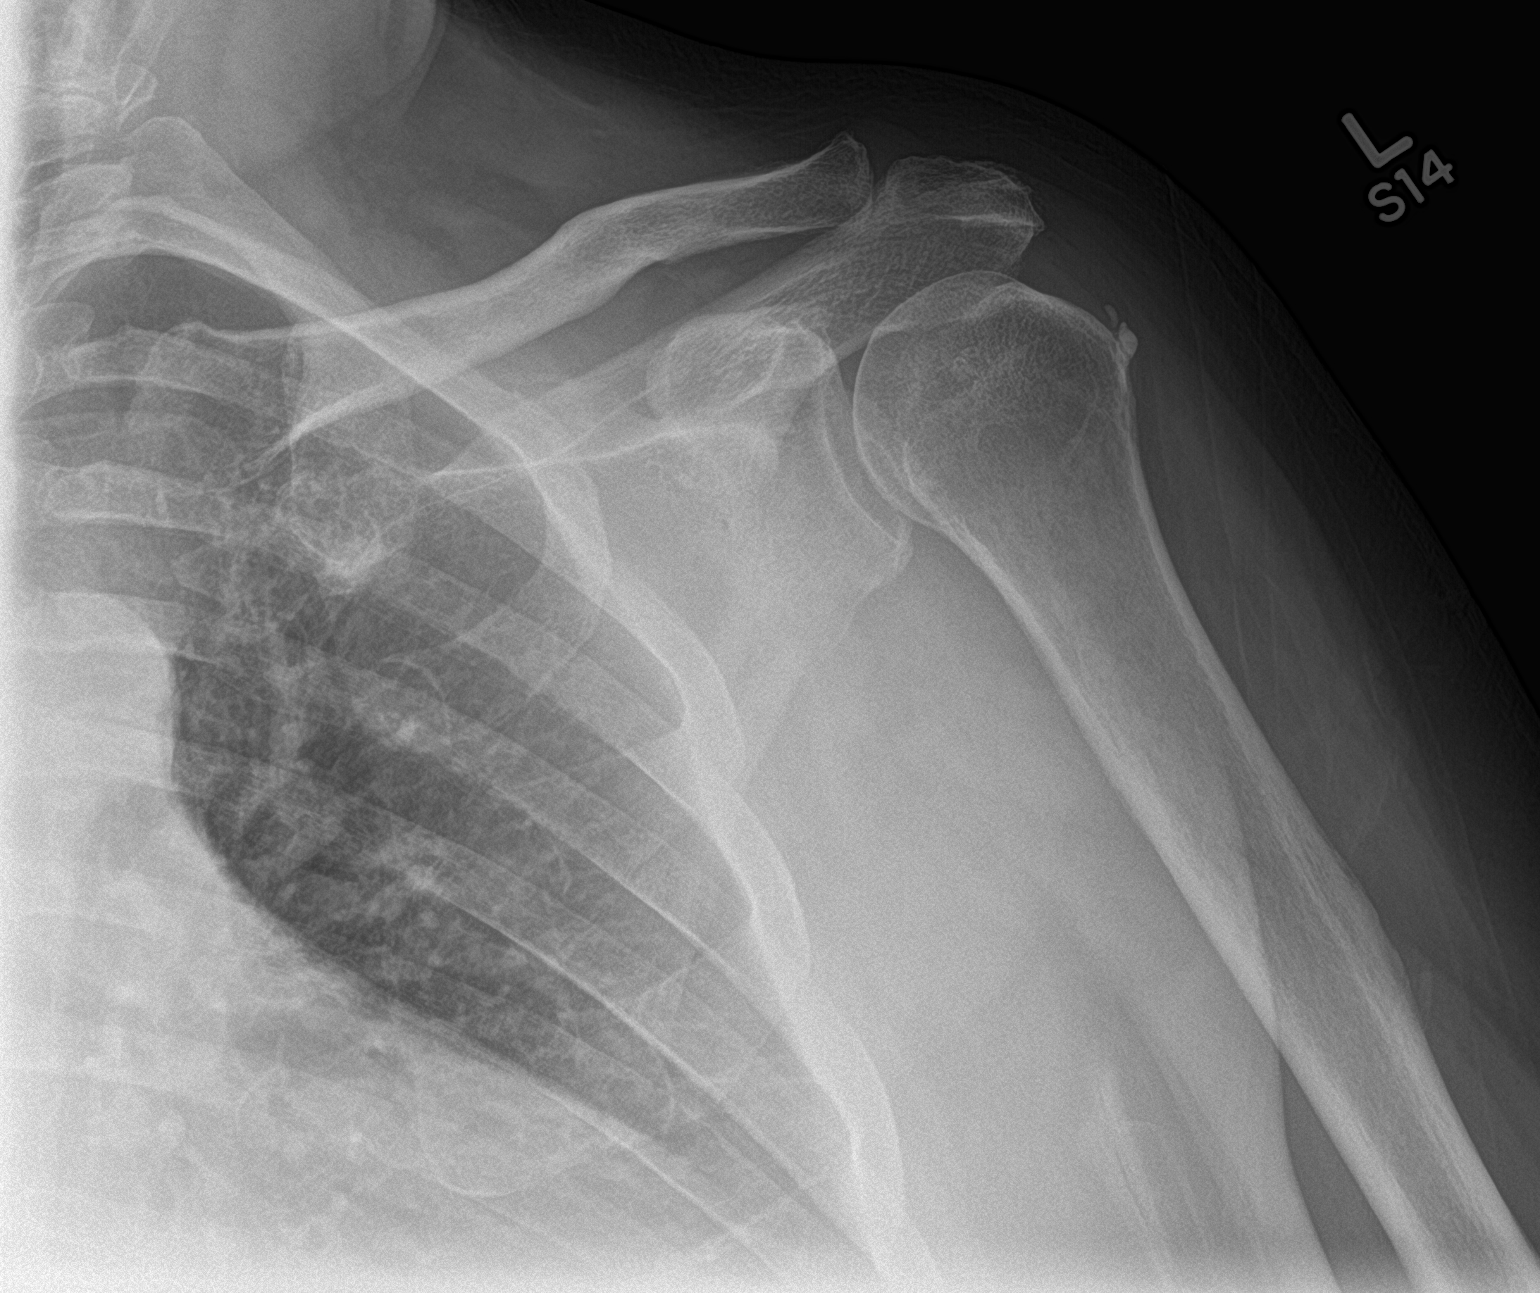

[y view]
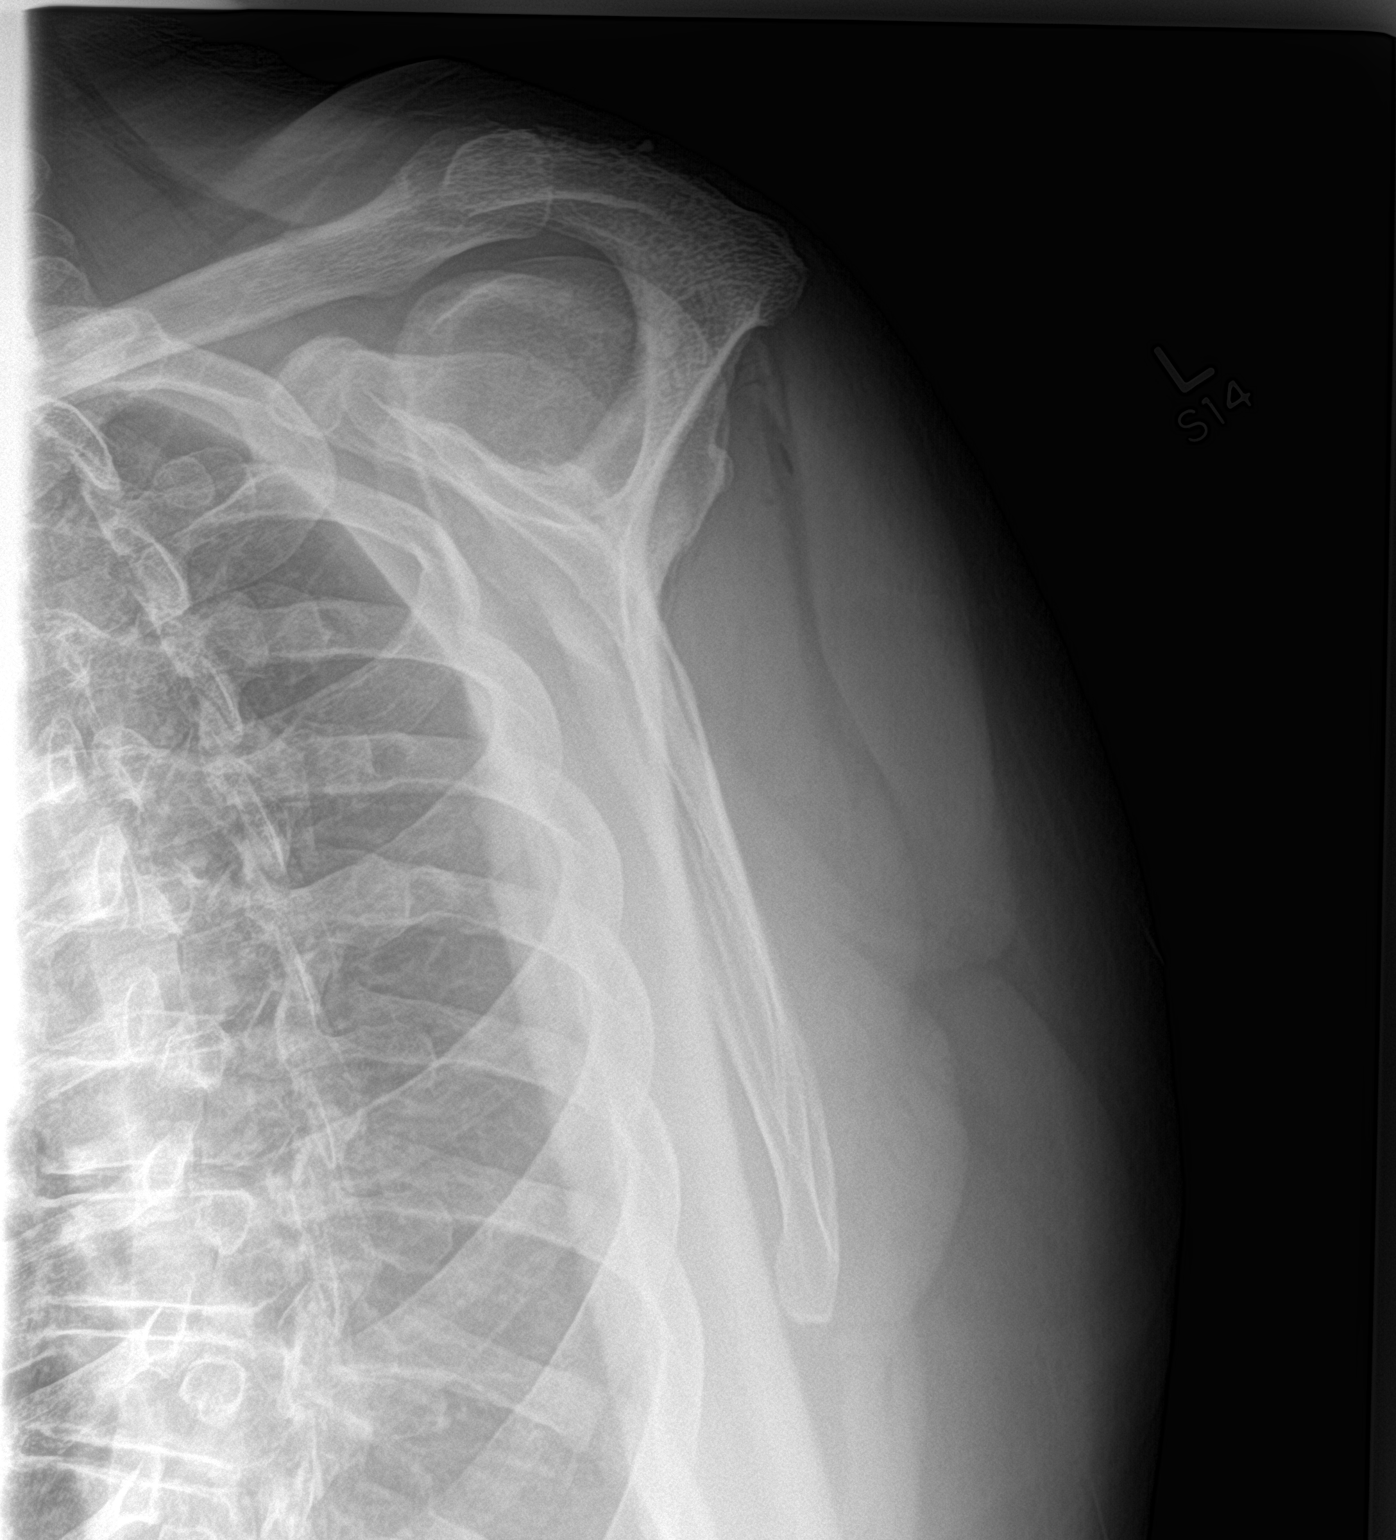

[shoulder axial]
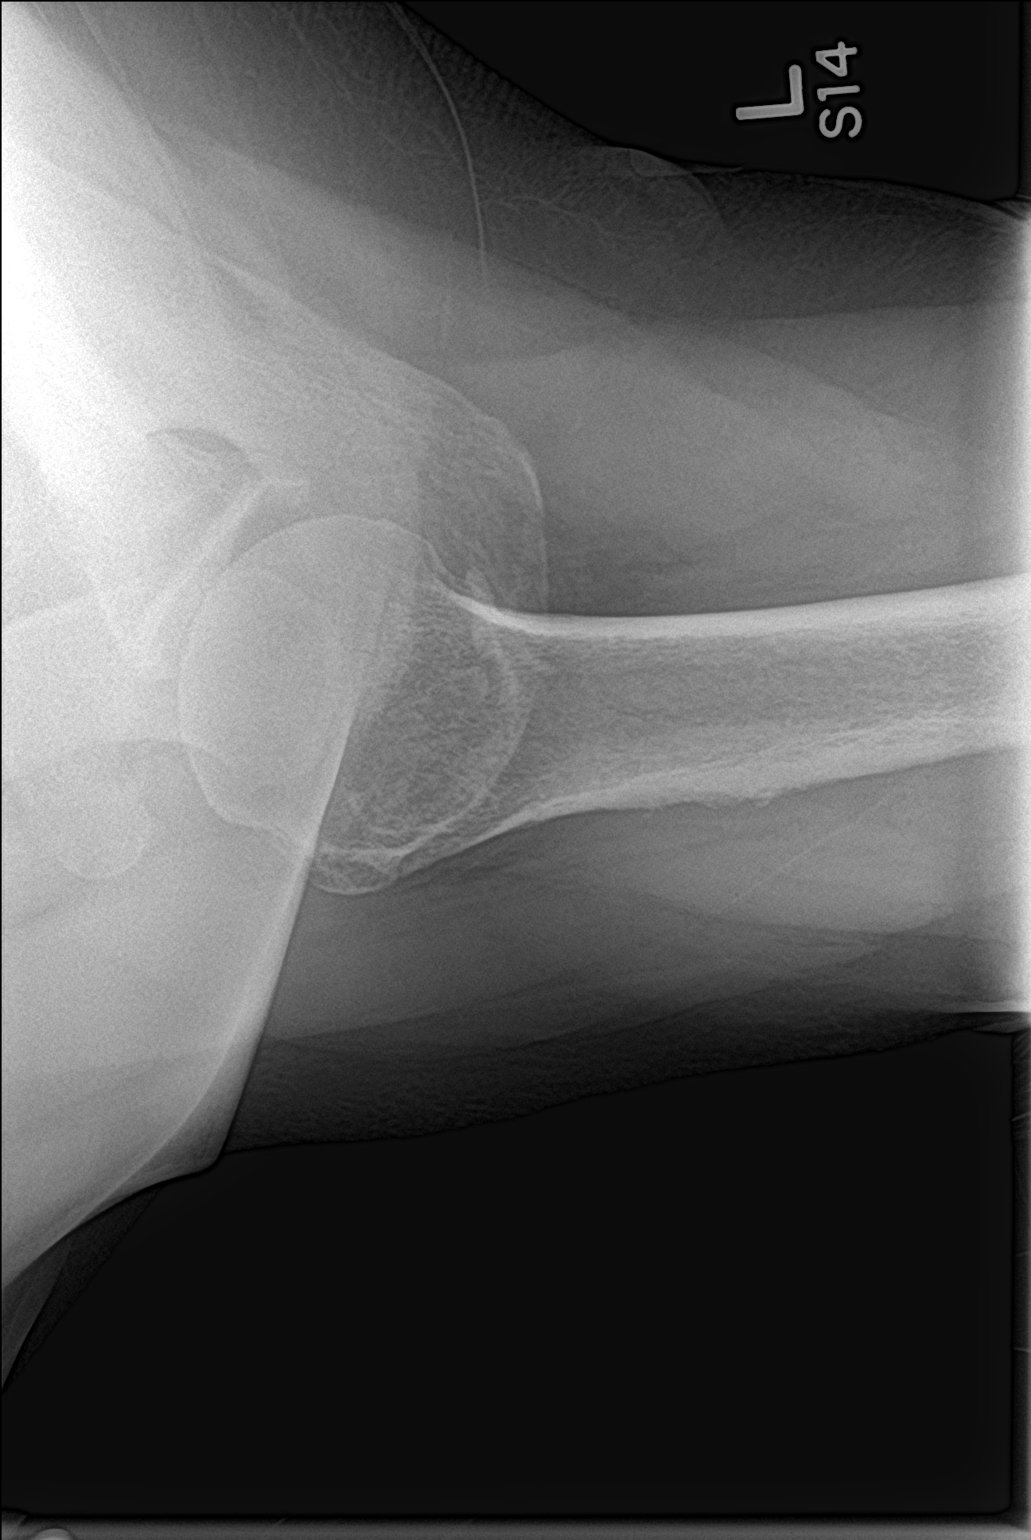

[3 of 3 positions shown; findings below may reference images not displayed]

FINDINGS: Mild acromioclavicular and glenohumeral degenerative change.
Calcific density noted over the region of the distal supraspinatus
tendon consistent with calcific tendinosis. No acute abnormality.
IMPRESSION: 1.  Mild acromioclavicular and glenohumeral degenerative change.

2.Calcific supraspinatus tendinosis.  No acute abnormality.

1.

## 2017-10-27 ENCOUNTER — Other Ambulatory Visit: Payer: Self-pay | Admitting: Endocrinology

## 2017-11-01 ENCOUNTER — Other Ambulatory Visit: Payer: Self-pay | Admitting: Endocrinology

## 2017-11-03 ENCOUNTER — Other Ambulatory Visit: Payer: Self-pay | Admitting: Endocrinology

## 2017-11-06 ENCOUNTER — Other Ambulatory Visit: Payer: Self-pay | Admitting: Endocrinology

## 2017-11-14 ENCOUNTER — Other Ambulatory Visit: Payer: Self-pay | Admitting: Endocrinology

## 2017-11-29 ENCOUNTER — Other Ambulatory Visit: Payer: Self-pay | Admitting: Endocrinology

## 2017-12-06 ENCOUNTER — Other Ambulatory Visit: Payer: Self-pay | Admitting: Endocrinology

## 2017-12-19 ENCOUNTER — Other Ambulatory Visit: Payer: Self-pay

## 2017-12-19 MED ORDER — SITAGLIPTIN PHOS-METFORMIN HCL 50-1000 MG PO TABS: 1.0000 | ORAL_TABLET | Freq: Two times a day (BID) | ORAL | 2 refills | Status: AC

## 2017-12-19 MED ORDER — EMPAGLIFLOZIN 25 MG PO TABS: 25.0000 mg | ORAL_TABLET | Freq: Every day | ORAL | 2 refills | Status: AC

## 2017-12-25 ENCOUNTER — Other Ambulatory Visit (INDEPENDENT_AMBULATORY_CARE_PROVIDER_SITE_OTHER): Payer: Medicare Other

## 2017-12-25 DIAGNOSIS — E1165 Type 2 diabetes mellitus with hyperglycemia: Secondary | ICD-10-CM

## 2017-12-25 DIAGNOSIS — E291 Testicular hypofunction: Secondary | ICD-10-CM

## 2017-12-25 DIAGNOSIS — Z794 Long term (current) use of insulin: Secondary | ICD-10-CM | POA: Diagnosis not present

## 2017-12-25 LAB — TESTOSTERONE: TESTOSTERONE: 219.54 ng/dL — AB (ref 300.00–890.00)

## 2017-12-25 LAB — BASIC METABOLIC PANEL
BUN: 14 mg/dL (ref 6–23)
CHLORIDE: 106 meq/L (ref 96–112)
CO2: 26 meq/L (ref 19–32)
CREATININE: 1.12 mg/dL (ref 0.40–1.50)
Calcium: 9.3 mg/dL (ref 8.4–10.5)
GFR: 69.69 mL/min (ref >60.00)
GLUCOSE: 122 mg/dL — AB (ref 70–99)
Potassium: 4.2 mEq/L (ref 3.5–5.1)
Sodium: 143 mEq/L (ref 135–145)

## 2017-12-25 LAB — HEMOGLOBIN A1C: Hgb A1c MFr Bld: 6.7 % — ABNORMAL HIGH (ref 4.6–6.5)

## 2017-12-28 ENCOUNTER — Ambulatory Visit (INDEPENDENT_AMBULATORY_CARE_PROVIDER_SITE_OTHER): Payer: Medicare Other | Admitting: Endocrinology

## 2017-12-28 ENCOUNTER — Encounter: Payer: Self-pay | Admitting: Endocrinology

## 2017-12-28 VITALS — BP 134/76 | HR 83 | Ht 70.5 in | Wt 255.4 lb

## 2017-12-28 DIAGNOSIS — E1165 Type 2 diabetes mellitus with hyperglycemia: Secondary | ICD-10-CM | POA: Diagnosis not present

## 2017-12-28 DIAGNOSIS — Z794 Long term (current) use of insulin: Secondary | ICD-10-CM

## 2017-12-28 DIAGNOSIS — E291 Testicular hypofunction: Secondary | ICD-10-CM | POA: Diagnosis not present

## 2017-12-28 MED ORDER — TESTOSTERONE 20.25 MG/ACT (1.62%) TD GEL: 2.0000 | Freq: Every day | TRANSDERMAL | 2 refills | Status: DC

## 2017-12-28 NOTE — Progress Notes (Signed)
Patient ID: Drew Salinas, male   DOB: 1951/07/04, 67 y.o.   MRN: [id number]   Chief complaint:  followup of endocrine problems    History of Present Illness:  DIABETES:  He was told to have diabetes several years ago and has been on insulin since about 2006 Previously in [place] he was taking large doses of insulin with Levemir 100 units twice a day along with Novolog 30-36 units before meals.  He prefers to take Levemir with a syringe because of needing large doses He has also been taking Janumet for a few years His A1c had been typically consistently high with this regimen He had been switched from Levemir to [facility] in 9/16  RECENT history:  INSULIN doses: TRESIBA 140 units day, Novolog 24 before meals usually  Oral hypoglycemic drugs: Jardiance 25 mg daily and Janumet 50/1000 twice a day.  His A1c is again unchanged and now 6.7  Current management, blood sugar patterns and problems identified:  His blood sugar patterns are very similar to the previous visit  He has been constantly reminded to check his blood sugars after meals especially after lunch and dinner but he does not do so  He is only checking readings in the mornings as before  At times appears that his freestyle Libre sensor is not accurate because it is giving readings as low as 40 without is having symptoms  Although he thinks he may sometimes correlate his fingerstick with the freestyle [place] and these are accurate he probably is not doing this regularly  Since he has been quitting smoking and not working as much he has not exercised are actively active  Continues to gain weight  His freestyle [place] indicates that his blood sugars are probably frequently high at bedtime after supper  He does not adjust his NOVOLOG based on his carbohydrate intake per meal size  This can be variable at lunch and dinner especially at lunch when he sometimes will eat more carbohydrate such as potato chips  He has not  had any symptoms of hypoglycemia, occasionally appears to have slightly low readings on his sensor tracing overnight  He was told today 136 units of TRESIBA but still taking 140  Blood sugar analysis from freestyle [employer]  Mean values apply above for all meters except median for One Touch  PRE-MEAL Fasting Lunch Dinner Bedtime Overall  Glucose range:       Mean/median: 113  103   201  126    POST-MEAL PC Breakfast PC Lunch PC Dinner  Glucose range:   ?    Mean/median:         Last diet/ CDE consultation: 3/18  Wt Readings from Last 3 Encounters:  12/28/17 255 lb 6.4 oz (115.8 kg)  09/28/17 251 lb 9.6 oz (114.1 kg)  08/10/17 246 lb 3.2 oz (111.7 kg)     Lab Results  Component Value Date   HGBA1C 6.7 (H) 12/25/2017   HGBA1C 6.7 (H) 09/25/2017   HGBA1C 6.4 06/16/2017   Lab Results  Component Value Date   MICROALBUR 2.6 (H) 09/25/2017   LDLCALC 53 08/10/2017   CREATININE 1.12 12/25/2017      HYPOGONADISM: He was seen in consultation in 9/14 for evaluation of a low testosterone level. This was initially tested because of erectile dysfunction At that time his libido had been fairly good and also did not complain of any unusual fatigue, poor muscle endurance,  lack of motivation or depression Evaluation confirmed a low free  testosterone level as well as normal prolactin. LH level was mildly increased  Because of documented hypogonadism he was started on Androderm 4 mg patch  Subjectively with testosterone supplementation he previously had more energy level and mood was improved. However because of skin irritation with the patch and inconsistent adherence he was changed to AndroGel, also has tried to [place]  Testosterone levels: Baseline 151, after starting AndroGel, 420 in 12/14  Recent history:  With AndroGel he was using 3 pumps daily with good control but using Axiron his testosterone levels were consistently low Because of insurance difficulties his  AndroGel was stopped He has been continued on clomiphene that was started in 2017  Taking CLOMIPHENE half tablet daily  Testosterone level is still below normal and trending lower Although he does not complain of unusual fatigue he is not working now and does not appear to have much motivation Also has significantly decreased libido   Lab Results  Component Value Date   TESTOSTERONE 219.54 (L) 12/25/2017      Allergies as of 12/28/2017      Reactions   Lisinopril Cough      Medication List        Accurate as of 12/28/17 10:27 AM. Always use your most recent med list.          amLODipine 5 MG tablet Commonly known as:  NORVASC Take 1 tablet (5 mg total) by mouth daily.   atorvastatin 10 MG tablet Commonly known as:  LIPITOR TAKE 1 TABLET BY MOUTH DAILY AT 6PM   BD INSULIN SYRINGE U/F 31G X 5/16" 1 ML Misc Generic drug:  Insulin Syringe-Needle U-100 USE AS DIRECTED 4-6 TIMES DAILY.   BD PEN NEEDLE NANO U/F 32G X 4 MM Misc Generic drug:  Insulin Pen Needle USE 3 PER DAY TO INJECT INSULIN   buPROPion 300 MG 24 hr tablet Commonly known as:  WELLBUTRIN XL Take 1 tablet (300 mg total) by mouth daily.   cholecalciferol 1000 units tablet Commonly known as:  VITAMIN D Take 1,000 Units by mouth daily.   clomiPHENE 50 MG tablet Commonly known as:  CLOMID TAKE 1/2 TABLET EVERY DAY   clopidogrel 75 MG tablet Commonly known as:  PLAVIX Take 1 tablet (75 mg total) by mouth daily.   empagliflozin 25 MG Tabs tablet Commonly known as:  JARDIANCE Take 25 mg by mouth daily.   FREESTYLE LIBRE READER Devi 1 Device by Does not apply route as directed.   FREESTYLE LIBRE SENSOR SYSTEM Misc APPLY TO UPPER ARM AND CHANGE SENSOR EVERY 10 DAYS   glucosamine-chondroitin 500-400 MG tablet Take 1 tablet by mouth 3 (three) times daily.   ibuprofen 200 MG tablet Commonly known as:  ADVIL,MOTRIN Take 400 mg by mouth every 6 (six) hours as needed (2-3 times day).     ketoconazole 2 % cream Commonly known as:  NIZORAL Apply 1 application topically 2 (two) times daily. To groin a feet for a month   methocarbamol 750 MG tablet Commonly known as:  ROBAXIN Take 1-2 tablets (750-1,500 mg total) by mouth at bedtime as needed for muscle spasms.   NOVOLOG 100 UNIT/ML injection Generic drug:  insulin aspart INJECT 30 TO 40 UNITS SUB CUTANEOUSLY 3 TIMES DAILY WITH MEALS PER SLIDING SCALE   omeprazole 10 MG capsule Commonly known as:  PRILOSEC Take 10 mg by mouth daily.   sitaGLIPtin-metformin 50-1000 MG tablet Commonly known as:  JANUMET Take 1 tablet by mouth 2 (two) times daily.   traZODone  100 MG tablet Commonly known as:  DESYREL TAKE 1 TABLET BY MOUTH AT BEDTIME.   TRESIBA FLEXTOUCH 200 UNIT/ML Sopn Generic drug:  Insulin Degludec INJECT 100U SUB CUTANEOUSLY 2 TIMES DAILY       Allergies:  Allergies  Allergen Reactions  . Lisinopril Cough    Past Medical History:  Diagnosis Date  . Allergy   . Diabetes mellitus 1995  . GERD (gastroesophageal reflux disease)   . Hyperlipidemia   . Hypertension   . Sleep apnea   . Stroke [facility]) 1993   No residual deficits    Past Surgical History:  Procedure Laterality Date  . APPENDECTOMY      Family History  Problem Relation Age of Onset  . Diabetes Mother   . Diabetes Brother     Social History:  reports that he quit smoking about 8 months ago. His smoking use included cigarettes. He has a 36.75 pack-year smoking history. he has never used smokeless tobacco. He reports that he drinks about 1.2 oz of alcohol per week. He reports that he does not use drugs.  REVIEW of systems:    He has history of hyperlipidemia treated with Lipitor 10 mg, LDL Levels adequate  Lab Results  Component Value Date   CHOL 146 08/10/2017   HDL 49 08/10/2017   LDLCALC 53 08/10/2017   LDLDIRECT 33.0 12/13/2016   TRIG 218 (H) 08/10/2017   CHOLHDL 3.0 08/10/2017    History of hypertension treated with  amlodipine, Followed by PCP  Also on Jardiance, blood pressure is relatively better today    BP Readings from Last 3 Encounters:  12/28/17 134/76  09/28/17 130/88  08/10/17 138/78     ERECTILE dysfunction: Has been prescribed Cialis, he is not currently interested in using this and has decreased libido also   Taking Plavix for history of stroke  LABS:   Lab on 12/25/2017  Component Date Value Ref Range Status  . Testosterone 12/25/2017 219.54* 300.00 - 890.00 ng/dL Final  . Sodium 16/10/960402/25/2019 143  135 - 145 mEq/L Final  . Potassium 12/25/2017 4.2  3.5 - 5.1 mEq/L Final  . Chloride 12/25/2017 106  96 - 112 mEq/L Final  . CO2 12/25/2017 26  19 - 32 mEq/L Final  . Glucose, Bld 12/25/2017 122* 70 - 99 mg/dL Final  . BUN 54/09/811902/25/2019 14  6 - 23 mg/dL Final  . Creatinine, Ser 12/25/2017 1.12  0.40 - 1.50 mg/dL Final  . Calcium 14/78/295602/25/2019 9.3  8.4 - 10.5 mg/dL Final  . GFR 21/30/865702/25/2019 69.69  >60.00 mL/min Final  . Hgb A1c MFr Bld 12/25/2017 6.7* 4.6 - 6.5 % Final   Glycemic Control Guidelines for People with Diabetes:Non Diabetic:  <6%Goal of Therapy: <7%Additional Action Suggested:  >8%      General Examination:   BP 134/76   Pulse 83   Ht 5' 10.5" (1.791 m)   Wt 255 lb 6.4 oz (115.8 kg)   SpO2 97%   BMI 36.13 kg/m     Assessment/ Plan:  DIABETES type II with obesity: See history of present illness for detailed discussion of current diabetes management, blood sugar patterns and problems identified  His A1c 6.7 Although his control appears to be overall good he probably has low normal blood sugars overnight and periodically high readings after evening meal at least He was shown his freestyle [place] results and discussed that he is only checking his blood sugars in the morning which is not enough to analyze his postprandial patterns and  adjust his mealtime doses He can do much better with postprandial monitoring which will help him adjust his NovoLog based on meal size and  carbohydrate intake and this was discussed also  Also reminded him that his weight is going up and he does need to cut back on high carbohydrate and high fat foods He needs to start exercising regularly and he thinks he may be able to start walking now   Recommendations:  As above  Also discussed blood sugar targets for and after meals  He will make sure he checks his blood sugars at least once a day after meals at various times  He probably needs 28 units of Novolog at suppertime for most meals  Continue Jardiance   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,  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 He has been  symptomatic with low levels of testosterone  However even though his testosterone level is now consistently below 300 and 20 over He also has decreased libido  Will try him on AndroGel and hopefully this will be covered by his insurance Discussed benefits of AndroGel and long-term use Also may have better insulin sensitivity and endogenous testosterone production if he loses weight and exercises   Counseling time on subjects discussed in assessment and plan sections is over 50% of today's 25 minute visit  Patient Instructions  Must check sugars at nite and after lunch  Must exercise daily  Probably use 28 Novolog at dinner         Reather Littler  12/28/2017 10:27 AM  Note: This office note was prepared with Dragon voice recognition system technology. Any transcriptional errors that result from this process are unintentional.

## 2017-12-28 NOTE — Patient Instructions (Addendum)
Must check sugars at nite and after lunch  Must exercise daily  Probably use 28 Novolog at dinner

## 2018-01-16 ENCOUNTER — Other Ambulatory Visit: Payer: Self-pay

## 2018-01-16 MED ORDER — FREESTYLE LIBRE 14 DAY SENSOR MISC: 1.0000 | 5 refills | Status: DC

## 2018-02-07 ENCOUNTER — Other Ambulatory Visit: Payer: Self-pay | Admitting: Family Medicine

## 2018-02-08 NOTE — Telephone Encounter (Signed)
Methocarbamol (Robaxin) refill Last OV: 08/10/17 Last Refill:08/10/17 #180 1 RF Pharmacy:[facility] [address]  PCP: Dr. Clelia CroftShaw

## 2018-03-15 ENCOUNTER — Other Ambulatory Visit: Payer: Self-pay

## 2018-03-15 ENCOUNTER — Telehealth: Payer: Self-pay | Admitting: Endocrinology

## 2018-03-15 MED ORDER — INSULIN DEGLUDEC 200 UNIT/ML ~~LOC~~ SOPN: 140.0000 [IU] | PEN_INJECTOR | Freq: Two times a day (BID) | SUBCUTANEOUS | 2 refills | Status: DC

## 2018-03-15 NOTE — Telephone Encounter (Signed)
Sent to pharmacy per patient request

## 2018-03-15 NOTE — Telephone Encounter (Signed)
Need refill of TRESIBA FLEXTOUCH 200 UNIT/ML SOPN [[id number]]   NOVOLOG 100 UNIT/ML injection [[id number]  Pharmacy:  [id number] - [place], [place] - [address] AT [facility] OF [address] & [address] DEA #:  [id number]

## 2018-03-22 ENCOUNTER — Telehealth: Payer: Self-pay | Admitting: Endocrinology

## 2018-03-22 ENCOUNTER — Other Ambulatory Visit: Payer: Self-pay

## 2018-03-22 MED ORDER — INSULIN DEGLUDEC 200 UNIT/ML ~~LOC~~ SOPN: 140.0000 [IU] | PEN_INJECTOR | Freq: Two times a day (BID) | SUBCUTANEOUS | 2 refills | Status: DC

## 2018-03-22 NOTE — Telephone Encounter (Signed)
Medication has been sent.  

## 2018-03-22 NOTE — Telephone Encounter (Signed)
Insulin Degludec (TRESIBA FLEXTOUCH) 200 UNIT/ML SOPN    Patient stated the pharmacy has not received the prescription for this and would like it resent  Patient is completely out on this.    Pharmacy:  [id number] - [place], [place] - [address] AT [facility] OF [address] DEA #:  [id number]

## 2018-03-23 ENCOUNTER — Other Ambulatory Visit (INDEPENDENT_AMBULATORY_CARE_PROVIDER_SITE_OTHER): Payer: Medicare Other

## 2018-03-23 DIAGNOSIS — Z794 Long term (current) use of insulin: Secondary | ICD-10-CM | POA: Diagnosis not present

## 2018-03-23 DIAGNOSIS — E1165 Type 2 diabetes mellitus with hyperglycemia: Secondary | ICD-10-CM | POA: Diagnosis not present

## 2018-03-23 DIAGNOSIS — E291 Testicular hypofunction: Secondary | ICD-10-CM

## 2018-03-23 LAB — HEMOGLOBIN A1C: HEMOGLOBIN A1C: 7.7 % — AB (ref 4.6–6.5)

## 2018-03-23 LAB — COMPREHENSIVE METABOLIC PANEL
ALBUMIN: 4 g/dL (ref 3.5–5.2)
ALK PHOS: 78 U/L (ref 39–117)
ALT: 41 U/L (ref 0–53)
AST: 47 U/L — ABNORMAL HIGH (ref 0–37)
BILIRUBIN TOTAL: 0.5 mg/dL (ref 0.2–1.2)
BUN: 15 mg/dL (ref 6–23)
CO2: 25 mEq/L (ref 19–32)
CREATININE: 1.09 mg/dL (ref 0.40–1.50)
Calcium: 9.5 mg/dL (ref 8.4–10.5)
Chloride: 103 mEq/L (ref 96–112)
GFR: 71.85 mL/min (ref >60.00)
GLUCOSE: 248 mg/dL — AB (ref 70–99)
Potassium: 4 mEq/L (ref 3.5–5.1)
SODIUM: 140 meq/L (ref 135–145)
TOTAL PROTEIN: 7.2 g/dL (ref 6.0–8.3)

## 2018-03-23 LAB — TESTOSTERONE: TESTOSTERONE: 169.05 ng/dL — AB (ref 300.00–890.00)

## 2018-03-28 ENCOUNTER — Ambulatory Visit (INDEPENDENT_AMBULATORY_CARE_PROVIDER_SITE_OTHER): Payer: Medicare Other | Admitting: Endocrinology

## 2018-03-28 ENCOUNTER — Encounter: Payer: Self-pay | Admitting: Endocrinology

## 2018-03-28 ENCOUNTER — Other Ambulatory Visit: Payer: Self-pay

## 2018-03-28 VITALS — BP 142/70 | HR 71 | Ht 70.5 in | Wt 254.6 lb

## 2018-03-28 DIAGNOSIS — E291 Testicular hypofunction: Secondary | ICD-10-CM

## 2018-03-28 DIAGNOSIS — E1165 Type 2 diabetes mellitus with hyperglycemia: Secondary | ICD-10-CM

## 2018-03-28 DIAGNOSIS — Z794 Long term (current) use of insulin: Secondary | ICD-10-CM

## 2018-03-28 LAB — GLUCOSE, POCT (MANUAL RESULT ENTRY): POC Glucose: 302 mg/dl — AB (ref 70–99)

## 2018-03-28 MED ORDER — CLOMIPHENE CITRATE 50 MG PO TABS: 50.0000 mg | ORAL_TABLET | Freq: Every day | ORAL | 3 refills | Status: AC

## 2018-03-28 MED ORDER — CLOMIPHENE CITRATE 50 MG PO TABS: 25.0000 mg | ORAL_TABLET | Freq: Every day | ORAL | Status: DC

## 2018-03-28 MED ORDER — INSULIN GLARGINE 300 UNIT/ML ~~LOC~~ SOPN: 150.0000 [IU] | PEN_INJECTOR | Freq: Every day | SUBCUTANEOUS | 3 refills | Status: DC

## 2018-03-28 NOTE — Progress Notes (Signed)
Patient ID: Drew Salinas, male   DOB: 02-Aug-1951, 67 y.o.   MRN: [id number]   Chief complaint:  followup of endocrine problems    History of Present Illness:  DIABETES:  He was told to have diabetes several years ago and has been on insulin since about 2006 Previously in [place] he was taking large doses of insulin with Levemir 100 units twice a day along with Novolog 30-36 units before meals.  He prefers to take Levemir with a syringe because of needing large doses He has also been taking Janumet for a few years His A1c had been typically consistently high with this regimen He had been switched from Levemir to [facility] in 9/16  RECENT history:  INSULIN doses: TRESIBA 140 units day, Novolog 24 before meals usually  Oral hypoglycemic drugs: Jardiance 25 mg daily and Janumet 50/1000 twice a day.  His A1c is higher at 7.7, previously stable at 6.7  Current management, blood sugar patterns and problems identified:  His blood sugar control has been worse because of difficulties getting his prescriptions on Medicare now  He has not had consistent amount of Tresiba insulin over the last month because of cost and his blood sugars had gone up to over 200  However he did not bring his freestyle libre meter for review and even though he is back on to see about the last couple of days his glucose was over 300 in the office fasting today  His weight is stable  He does still take NovoLog and has no problems with the coverage for his oral medications which he is taking regularly  Previously was having high readings after supper and not clear if this is improved or not  He is sometimes walking but not consistently active  Blood sugar readings not available   Last diet/ CDE consultation: 3/18  Wt Readings from Last 3 Encounters:  03/28/18 254 lb 9.6 oz (115.5 kg)  12/28/17 255 lb 6.4 oz (115.8 kg)  09/28/17 251 lb 9.6 oz (114.1 kg)    Lab Results  Component Value Date   HGBA1C  7.7 (H) 03/23/2018   HGBA1C 6.7 (H) 12/25/2017   HGBA1C 6.7 (H) 09/25/2017   Lab Results  Component Value Date   MICROALBUR 2.6 (H) 09/25/2017   LDLCALC 53 08/10/2017   CREATININE 1.09 03/23/2018    HYPOGONADISM: He was seen in consultation in 9/14 for evaluation of a low testosterone level. This was initially tested because of erectile dysfunction At that time his libido had been fairly good and also did not complain of any unusual fatigue, poor muscle endurance,  lack of motivation or depression Evaluation confirmed a low free testosterone level as well as normal prolactin. LH level was mildly increased  Because of documented hypogonadism he was started on Androderm 4 mg patch  Subjectively with testosterone supplementation he previously had more energy level and mood was improved. However because of skin irritation with the patch and inconsistent adherence he was changed to AndroGel, also has tried to [place]  Testosterone levels: Baseline 151, after starting AndroGel, 420 in 12/14  Recent history:  With AndroGel he was using 3 pumps daily with good control but using Axiron his testosterone levels were consistently low Because of insurance difficulties his AndroGel was stopped He had been on clomiphene that was started in 2017 However even with taking half tablet daily his testosterone level was relatively low He was recommended AndroGel in February but because of cost he has not taken this  and did not call to ask for replacement Although he has decreased libido he does not think he is any more tired than usual, does not seem to have low motivation level    Lab Results  Component Value Date   TESTOSTERONE 169.05 (L) 03/23/2018      Allergies as of 03/28/2018      Reactions   Lisinopril Cough      Medication List        Accurate as of 03/28/18 12:02 PM. Always use your most recent med list.          amLODipine 5 MG tablet Commonly known as:  NORVASC Take 1  tablet (5 mg total) by mouth daily.   atorvastatin 10 MG tablet Commonly known as:  LIPITOR TAKE 1 TABLET BY MOUTH DAILY AT 6PM   BD INSULIN SYRINGE U/F 31G X 5/16" 1 ML Misc Generic drug:  Insulin Syringe-Needle U-100 USE AS DIRECTED 4-6 TIMES DAILY.   BD PEN NEEDLE NANO U/F 32G X 4 MM Misc Generic drug:  Insulin Pen Needle USE 3 PER DAY TO INJECT INSULIN   cholecalciferol 1000 units tablet Commonly known as:  VITAMIN D Take 1,000 Units by mouth daily.   clomiPHENE 50 MG tablet Commonly known as:  CLOMID Take 1 tablet (50 mg total) by mouth daily. Take one half tablet by mouth every other day.   clopidogrel 75 MG tablet Commonly known as:  PLAVIX Take 1 tablet (75 mg total) by mouth daily.   empagliflozin 25 MG Tabs tablet Commonly known as:  JARDIANCE Take 25 mg by mouth daily.   FREESTYLE LIBRE 14 DAY SENSOR Misc 1 Device by Does not apply route every 14 (fourteen) days.   FREESTYLE LIBRE READER Devi 1 Device by Does not apply route as directed.   glucosamine-chondroitin 500-400 MG tablet Take 1 tablet by mouth 3 (three) times daily.   Insulin Degludec 200 UNIT/ML Sopn Commonly known as:  TRESIBA FLEXTOUCH Inject 140 Units into the skin 2 (two) times daily.   Insulin Glargine 300 UNIT/ML Sopn Commonly known as:  TOUJEO MAX SOLOSTAR Inject 150 Units into the skin daily.   methocarbamol 750 MG tablet Commonly known as:  ROBAXIN TAKE 1-2 TABLETS (750-1,500 MG TOTAL) BY MOUTH AT BEDTIME AS NEEDED FOR MUSCLE SPASMS.   NOVOLOG 100 UNIT/ML injection Generic drug:  insulin aspart INJECT 30 TO 40 UNITS SUB CUTANEOUSLY 3 TIMES DAILY WITH MEALS PER SLIDING SCALE   sitaGLIPtin-metformin 50-1000 MG tablet Commonly known as:  JANUMET Take 1 tablet by mouth 2 (two) times daily.   traZODone 100 MG tablet Commonly known as:  DESYREL TAKE 1 TABLET BY MOUTH AT BEDTIME.       Allergies:  Allergies  Allergen Reactions  . Lisinopril Cough    Past Medical History:    Diagnosis Date  . Allergy   . Diabetes mellitus 1995  . GERD (gastroesophageal reflux disease)   . Hyperlipidemia   . Hypertension   . Sleep apnea   . Stroke [facility]) 1993   No residual deficits    Past Surgical History:  Procedure Laterality Date  . APPENDECTOMY      Family History  Problem Relation Age of Onset  . Diabetes Mother   . Diabetes Brother     Social History:  reports that he quit smoking about a year ago. His smoking use included cigarettes. He has a 36.75 pack-year smoking history. He has never used smokeless tobacco. He reports that he drinks about 1.2 oz  of alcohol per week. He reports that he does not use drugs.  REVIEW of systems:    He has history of hyperlipidemia treated with Lipitor 10 mg, LDL Levels as follows  Lab Results  Component Value Date   CHOL 146 08/10/2017   HDL 49 08/10/2017   LDLCALC 53 08/10/2017   LDLDIRECT 33.0 12/13/2016   TRIG 218 (H) 08/10/2017   CHOLHDL 3.0 08/10/2017    History of hypertension treated with amlodipine, Followed by PCP  BP Readings from Last 3 Encounters:  03/28/18 (!) 142/70  12/28/17 134/76  09/28/17 130/88     ERECTILE dysfunction: Has been prescribed Cialis previously   Taking Plavix for history of stroke  LABS:   Office Visit on 03/28/2018  Component Date Value Ref Range Status  . POC Glucose 03/28/2018 302* 70 - 99 mg/dl Final  Lab on 69/62/9528  Component Date Value Ref Range Status  . Testosterone 03/23/2018 169.05* 300.00 - 890.00 ng/dL Final  . Sodium 41/32/4401 140  135 - 145 mEq/L Final  . Potassium 03/23/2018 4.0  3.5 - 5.1 mEq/L Final  . Chloride 03/23/2018 103  96 - 112 mEq/L Final  . CO2 03/23/2018 25  19 - 32 mEq/L Final  . Glucose, Bld 03/23/2018 248* 70 - 99 mg/dL Final  . BUN 02/72/5366 15  6 - 23 mg/dL Final  . Creatinine, Ser 03/23/2018 1.09  0.40 - 1.50 mg/dL Final  . Total Bilirubin 03/23/2018 0.5  0.2 - 1.2 mg/dL Final  . Alkaline Phosphatase 03/23/2018 78  39 - 117  U/L Final  . AST 03/23/2018 47* 0 - 37 U/L Final  . ALT 03/23/2018 41  0 - 53 U/L Final  . Total Protein 03/23/2018 7.2  6.0 - 8.3 g/dL Final  . Albumin 44/12/4740 4.0  3.5 - 5.2 g/dL Final  . Calcium 59/56/3875 9.5  8.4 - 10.5 mg/dL Final  . GFR 64/33/2951 71.85  >60.00 mL/min Final  . Hgb A1c MFr Bld 03/23/2018 7.7* 4.6 - 6.5 % Final   Glycemic Control Guidelines for People with Diabetes:Non Diabetic:  <6%Goal of Therapy: <7%Additional Action Suggested:  >8%      General Examination:   BP (!) 142/70 (BP Location: Left Arm, Patient Position: Sitting, Cuff Size: Normal)   Pulse 71   Ht 5' 10.5" (1.791 m)   Wt 254 lb 9.6 oz (115.5 kg)   SpO2 95%   BMI 36.01 kg/m     Assessment/ Plan:  DIABETES type II with obesity: See history of present illness for detailed discussion of current diabetes management, blood sugar patterns and problems identified  His A1c 7.7  Most likely his blood sugar control is poor because of not being able to afford his [place] insulin and blood sugars being significantly higher in the last month, usually taking a large dose of basal insulin Today also not able to review his blood sugar patterns but likely has persistently high sugars throughout the day He may be also able to do better with cutting back on portions and high carbohydrate foods as well as regular exercise  Recommendations:  As above  He will try to get assistance from the company for his insulin  In the meantime he can use up the supply he has at home with the same dose of 140 units  Subsequently he can use Toujeo with the free 1 month supply coupon that was given to him and showed him the differences between this and [place], may need higher doses  Also  discussed the option of using the [employer] brand insulin once he is done with his current supplies for basal insulin.  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 He has been previously symptomatic with low levels of testosterone  However even though his testosterone level is now fairly low without any medications he is not consistently complaining of fatigue He cannot afford AndroGel and will have him go back to clomiphene for at least some improvement in his levels  Patient Instructions  Toujeo 140 to start   Relion Novolin N, 70 in am and 70 at bedtime if not on Tresiba/Toujeo  Walk daily  Check blood sugars on waking up  daily  Also check blood sugars about 2 hours after a meal and do this after different meals by rotation  Recommended blood sugar levels on waking up is 90-130 and about 2 hours after meal is 130-160  Please bring your blood sugar monitor to each visit, thank you        Counseling time on subjects discussed in assessment and plan sections is over 50% of today's 25 minute visit  Reather Littler  03/28/2018 12:02 PM  Note: This office note was prepared with Dragon voice recognition system technology. Any transcriptional errors that result from this process are unintentional.

## 2018-03-28 NOTE — Patient Instructions (Addendum)
Toujeo 140 to start   Relion Novolin N, 70 in am and 70 at bedtime if not on Tresiba/Toujeo  Walk daily  Check blood sugars on waking up  daily  Also check blood sugars about 2 hours after a meal and do this after different meals by rotation  Recommended blood sugar levels on waking up is 90-130 and about 2 hours after meal is 130-160  Please bring your blood sugar monitor to each visit, thank you

## 2018-03-28 NOTE — Progress Notes (Signed)
error 

## 2018-04-06 IMAGING — XA DG FLUORO GUIDE NDL PLC/BX
1 series · 1 of 1 positions shown · non-contrast
Comparison: none

CLINICAL DATA: LEFT shoulder pain.

[Series 1: ortho standard · 1 of 1 slices shown]
[im 1/1]
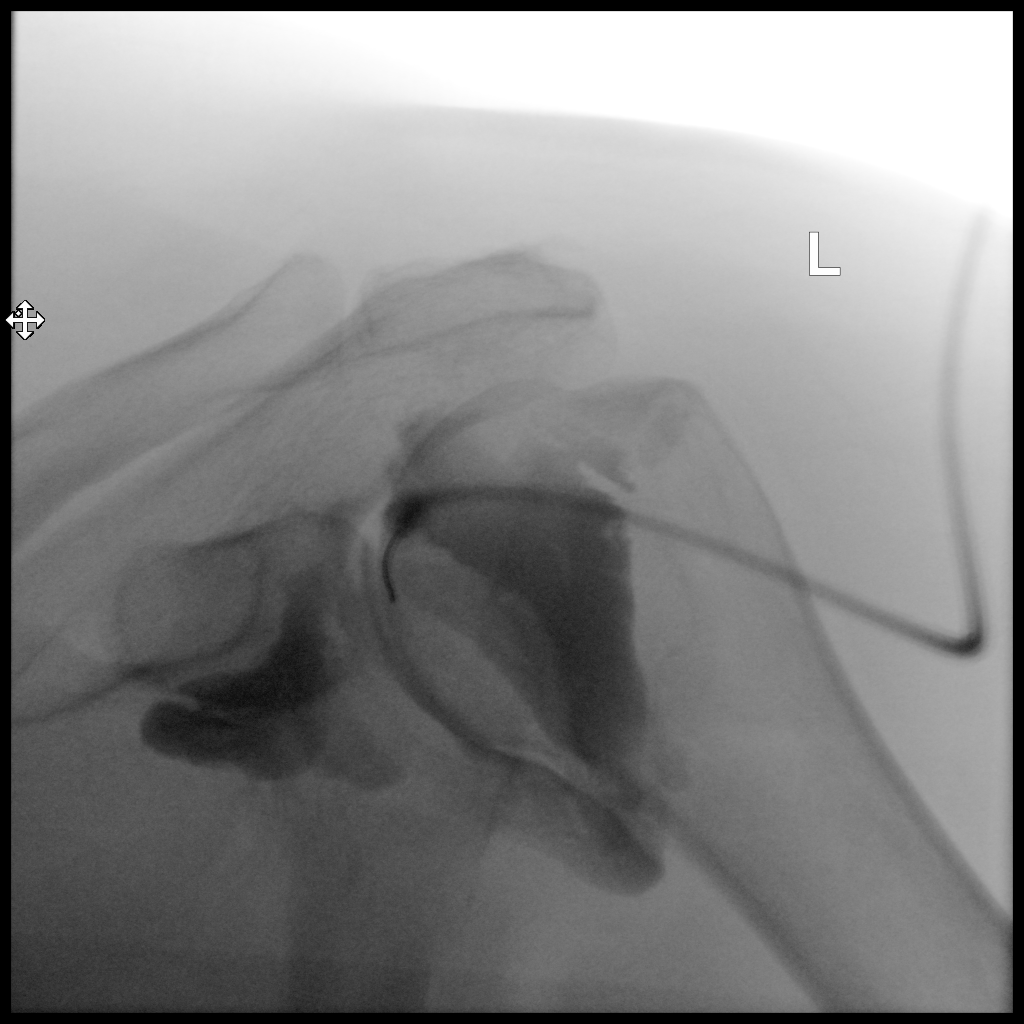

[1 of 1 positions shown; findings below may reference images not displayed]

FLUOROSCOPY TIME:  7 seconds corresponding to a Dose Area Product of
13.33 ?Gy*m2

PROCEDURE:
LEFT SHOULDER INJECTION UNDER FLUOROSCOPY

Informed consent was obtained. Time-out performed. An appropriate
skin entrance site was determined. The site was marked, prepped with
Betadine, draped in the usual sterile fashion, and infiltrated
locally with 1% Lidocaine. 22 gauge spinal needle was advanced to
the superomedial margin of the humeral head under intermittent
fluoroscopy. 1 ml of Lidocaine injected easily. A mixture of 0.1 ml
Multihance and 20 ml of dilute Isovue M 200 was then used to opacify
the LEFT shoulder capsule. No immediate complication.
IMPRESSION: Technically successful LEFT shoulder injection for MRI.

## 2018-04-30 ENCOUNTER — Other Ambulatory Visit: Payer: Self-pay

## 2018-04-30 ENCOUNTER — Telehealth: Payer: Self-pay | Admitting: Endocrinology

## 2018-04-30 MED ORDER — INSULIN PEN NEEDLE 32G X 4 MM MISC: 2 refills | Status: AC

## 2018-04-30 NOTE — Telephone Encounter (Signed)
Patient need a refill of his pen needles  Pharmacy:  [id number] - [place], [place] - [address] [facility] [address] DEA #:  [id number]

## 2018-04-30 NOTE — Telephone Encounter (Signed)
New Rx sent to pharmacy

## 2018-05-14 ENCOUNTER — Other Ambulatory Visit: Payer: Self-pay | Admitting: Endocrinology

## 2018-05-21 ENCOUNTER — Other Ambulatory Visit: Payer: Self-pay

## 2018-05-21 MED ORDER — FREESTYLE LIBRE 14 DAY SENSOR MISC: 1.0000 | 1 refills | Status: AC

## 2018-05-25 ENCOUNTER — Other Ambulatory Visit (INDEPENDENT_AMBULATORY_CARE_PROVIDER_SITE_OTHER): Payer: Medicare Other

## 2018-05-25 DIAGNOSIS — E291 Testicular hypofunction: Secondary | ICD-10-CM

## 2018-05-25 DIAGNOSIS — Z794 Long term (current) use of insulin: Secondary | ICD-10-CM

## 2018-05-25 DIAGNOSIS — E1165 Type 2 diabetes mellitus with hyperglycemia: Secondary | ICD-10-CM

## 2018-05-25 LAB — BASIC METABOLIC PANEL
BUN: 15 mg/dL (ref 6–23)
CHLORIDE: 103 meq/L (ref 96–112)
CO2: 24 mEq/L (ref 19–32)
Calcium: 9.4 mg/dL (ref 8.4–10.5)
Creatinine, Ser: 1.12 mg/dL (ref 0.40–1.50)
GFR: 69.6 mL/min (ref >60.00)
Glucose, Bld: 203 mg/dL — ABNORMAL HIGH (ref 70–99)
Potassium: 4.1 mEq/L (ref 3.5–5.1)
Sodium: 140 mEq/L (ref 135–145)

## 2018-05-25 LAB — LIPID PANEL
CHOL/HDL RATIO: 11
Cholesterol: 346 mg/dL — ABNORMAL HIGH (ref 0–200)
HDL: 30.2 mg/dL — ABNORMAL LOW (ref >39.00)
TRIGLYCERIDES: 860 mg/dL — AB (ref 0.0–149.0)

## 2018-05-25 LAB — TESTOSTERONE: Testosterone: 193.38 ng/dL — ABNORMAL LOW (ref 300.00–890.00)

## 2018-05-25 LAB — LDL CHOLESTEROL, DIRECT: Direct LDL: 39 mg/dL

## 2018-05-26 LAB — FRUCTOSAMINE: Fructosamine: 341 umol/L — ABNORMAL HIGH (ref 0–285)

## 2018-05-29 ENCOUNTER — Ambulatory Visit: Payer: Medicare Other | Admitting: Endocrinology

## 2018-05-29 NOTE — Progress Notes (Signed)
Patient ID: Drew Salinas, male   DOB: Nov 30, 1950, 67 y.o.   MRN: [id number]   Chief complaint:  followup of endocrine problems    History of Present Illness:  DIABETES:  He was told to have diabetes several years ago and has been on insulin since about 2006 Previously in [place] he was taking large doses of insulin with Levemir 100 units twice a day along with Novolog 30-36 units before meals.  He prefers to take Levemir with a syringe because of needing large doses He has also been taking Janumet for a few years His A1c had been typically consistently high with this regimen He had been switched from Levemir to [facility] in 9/16  RECENT history:  INSULIN doses: TOUJEO 150 units day in a.m., Novolog 30 before meals usually  Oral hypoglycemic drugs: Jardiance 25 mg daily and Janumet 50/1000 twice a day.  His A1c was last higher at 7.7, previously stable at 6.7 Also fructosamine is higher at 341 now indicating poor control  Current management, blood sugar patterns and problems identified:  His blood sugar control still does not appear to be improved even though he says he is taking his insulin doses as directed  He is taking Toujeo compared to [place] and can afford this  Although he had fairly good fasting blood sugars earlier this month when he was monitoring his lab glucose was over 200  Even though he is using the freestyle libre sensor he is checking blood sugars mostly fasting and not clear if his sugars are high later in the day  Has only a few readings or data available for late at night and these appear to be fairly consistently high  Also with his triglycerides being very high on the morning of his labs likely that he is not watching his diet consistently with portions or carbohydrates  He thinks he is taking NovoLog consistently before meals  Blood sugar readings between 7/7 and 7/20:  From freestyle Libre sensor 2-week fasting 118 average and after supper  211   Last diet/ CDE consultation: 3/18  Wt Readings from Last 3 Encounters:  05/30/18 252 lb 9.6 oz (114.6 kg)  03/28/18 254 lb 9.6 oz (115.5 kg)  12/28/17 255 lb 6.4 oz (115.8 kg)    Lab Results  Component Value Date   HGBA1C 7.7 (H) 03/23/2018   HGBA1C 6.7 (H) 12/25/2017   HGBA1C 6.7 (H) 09/25/2017   Lab Results  Component Value Date   MICROALBUR 2.6 (H) 09/25/2017   LDLCALC 53 08/10/2017   CREATININE 1.12 05/25/2018    HYPOGONADISM: He was seen in consultation in 9/14 for evaluation of a low testosterone level. This was initially tested because of erectile dysfunction At that time his libido had been fairly good and also did not complain of any unusual fatigue, poor muscle endurance,  lack of motivation or depression Evaluation confirmed a low free testosterone level as well as normal prolactin. LH level was mildly increased  Because of documented hypogonadism he was started on Androderm 4 mg patch  Subjectively with testosterone supplementation he previously had more energy level and mood was improved. However because of skin irritation with the patch and inconsistent adherence he was changed to AndroGel, also has tried to [place]  Testosterone levels: Baseline 151, after starting AndroGel, 420 in 12/14  Recent history:  With AndroGel he was using 3 pumps daily with good control but using Axiron his testosterone levels were consistently low Because of insurance difficulties his AndroGel was  stopped He had been on clomiphene that was started in 2017 However even with taking half tablet daily his testosterone level is persistently low  He was recommended AndroGel in February 2019 but because of cost he has not taken this and still cannot afford this Does not consistently have symptoms of fatigue, decreased motivation or decreased libido He takes his clomiphene consistently and cannot afford this currently with half a tablet daily   Lab Results  Component Value  Date   TESTOSTERONE 193.38 (L) 05/25/2018      Allergies as of 05/30/2018      Reactions   Lisinopril Cough      Medication List        Accurate as of 05/30/18  8:33 AM. Always use your most recent med list.          amLODipine 5 MG tablet Commonly known as:  NORVASC Take 1 tablet (5 mg total) by mouth daily.   atorvastatin 10 MG tablet Commonly known as:  LIPITOR TAKE 1 TABLET BY MOUTH DAILY AT 6PM   BD INSULIN SYRINGE U/F 31G X 5/16" 1 ML Misc Generic drug:  Insulin Syringe-Needle U-100 USE AS DIRECTED 4-6 TIMES DAILY.   cholecalciferol 1000 units tablet Commonly known as:  VITAMIN D Take 1,000 Units by mouth daily.   clomiPHENE 50 MG tablet Commonly known as:  CLOMID Take 1 tablet (50 mg total) by mouth daily. Take one half tablet by mouth every other day.   clopidogrel 75 MG tablet Commonly known as:  PLAVIX Take 1 tablet (75 mg total) by mouth daily.   empagliflozin 25 MG Tabs tablet Commonly known as:  JARDIANCE Take 25 mg by mouth daily.   fenofibrate 145 MG tablet Commonly known as:  TRICOR Take 1 tablet (145 mg total) by mouth daily.   FREESTYLE LIBRE 14 DAY SENSOR Misc 1 each by Does not apply route every 14 (fourteen) days.   FREESTYLE LIBRE READER Devi 1 Device by Does not apply route as directed.   gabapentin 300 MG capsule Commonly known as:  NEURONTIN Take 1 capsule (300 mg total) by mouth 3 (three) times daily.   glucosamine-chondroitin 500-400 MG tablet Take 1 tablet by mouth 3 (three) times daily.   Insulin Glargine 300 UNIT/ML Sopn Commonly known as:  TOUJEO MAX SOLOSTAR Inject 150 Units into the skin daily.   Insulin Pen Needle 32G X 4 MM Misc Commonly known as:  BD PEN NEEDLE NANO U/F USE 3 PER DAY TO INJECT INSULIN   methocarbamol 750 MG tablet Commonly known as:  ROBAXIN TAKE 1-2 TABLETS (750-1,500 MG TOTAL) BY MOUTH AT BEDTIME AS NEEDED FOR MUSCLE SPASMS.   NOVOLOG 100 UNIT/ML injection Generic drug:  insulin  aspart INJECT 30 TO 40 UNITS SUB CUTANEOUSLY 3 TIMES DAILY WITH MEALS PER SLIDING SCALE   sitaGLIPtin-metformin 50-1000 MG tablet Commonly known as:  JANUMET Take 1 tablet by mouth 2 (two) times daily.   traZODone 100 MG tablet Commonly known as:  DESYREL TAKE 1 TABLET BY MOUTH AT BEDTIME.       Allergies:  Allergies  Allergen Reactions  . Lisinopril Cough    Past Medical History:  Diagnosis Date  . Allergy   . Diabetes mellitus 1995  . GERD (gastroesophageal reflux disease)   . Hyperlipidemia   . Hypertension   . Sleep apnea   . Stroke [facility](HCC) 1993   No residual deficits    Past Surgical History:  Procedure Laterality Date  . APPENDECTOMY  Family History  Problem Relation Age of Onset  . Diabetes Mother   . Diabetes Brother     Social History:  reports that he quit smoking about 13 months ago. His smoking use included cigarettes. He has a 36.75 pack-year smoking history. He has never used smokeless tobacco. He reports that he drinks about 1.2 oz of alcohol per week. He reports that he does not use drugs.  REVIEW of systems:    He has history of hyperlipidemia treated with Lipitor 10 mg, LDL Levels as follows However his TRIGLYCERIDES are much higher than before, previously about 200  Lab Results  Component Value Date   CHOL 346 (H) 05/25/2018   HDL 30.20 (L) 05/25/2018   LDLCALC 53 08/10/2017   LDLDIRECT 39.0 05/25/2018   TRIG 860.0 (H) 05/25/2018   CHOLHDL 11 05/25/2018    History of hypertension treated with amlodipine, Followed by PCP  BP Readings from Last 3 Encounters:  05/30/18 140/80  03/28/18 (!) 142/70  12/28/17 134/76     ERECTILE dysfunction: Has been prescribed Cialis previously  NEUROPATHY: He is not complaining of tingling and some burning in his feet and this can be at any time of the day or night Previously not on treatment   Taking Plavix for history of stroke  LABS:   Lab on 05/25/2018  Component Date Value Ref  Range Status  . Testosterone 05/25/2018 193.38* 300.00 - 890.00 ng/dL Final  . Cholesterol 16/08/9603 346* 0 - 200 mg/dL Final   ATP III Classification       Desirable:  < 200 mg/dL               Borderline High:  200 - 239 mg/dL          High:  > = 540 mg/dL  . Triglycerides 05/25/2018 860.0* 0.0 - 149.0 mg/dL Final   Normal:  <981 mg/dLBorderline High:  150 - 199 mg/dLTriglyceride is over 400; calculations on Lipids are invalid.  Marland Kitchen HDL 05/25/2018 30.20* >39.00 mg/dL Final  . Total CHOL/HDL Ratio 05/25/2018 11   Final                  Men          Women1/2 Average Risk     3.4          3.3Average Risk          5.0          4.42X Average Risk          9.6          7.13X Average Risk          15.0          11.0                      . Sodium 05/25/2018 140  135 - 145 mEq/L Final  . Potassium 05/25/2018 4.1  3.5 - 5.1 mEq/L Final  . Chloride 05/25/2018 103  96 - 112 mEq/L Final  . CO2 05/25/2018 24  19 - 32 mEq/L Final  . Glucose, Bld 05/25/2018 203* 70 - 99 mg/dL Final  . BUN 19/14/7829 15  6 - 23 mg/dL Final  . Creatinine, Ser 05/25/2018 1.12  0.40 - 1.50 mg/dL Final  . Calcium 56/21/3086 9.4  8.4 - 10.5 mg/dL Final  . GFR 57/84/6962 69.60  >60.00 mL/min Final  . Fructosamine 05/25/2018 341* 0 - 285 umol/L Final   Comment: Published reference interval  for apparently healthy subjects between age 23 and 62 is 72 - 285 umol/L and in a poorly controlled diabetic population is 228 - 563 umol/L with a mean of 396 umol/L.   Marland Kitchen Direct LDL 05/25/2018 39.0  mg/dL Final   Optimal:  <096 mg/dLNear or Above Optimal:  100-129 mg/dLBorderline High:  130-159 mg/dLHigh:  160-189 mg/dLVery High:  >190 mg/dL     General Examination:   BP 140/80 (BP Location: Left Arm, Patient Position: Sitting, Cuff Size: Normal)   Pulse 79   Ht 5' 10.5" (1.791 m)   Wt 252 lb 9.6 oz (114.6 kg)   SpO2 97%   BMI 35.73 kg/m     Assessment/ Plan:  DIABETES type II with obesity: See history of present illness for  detailed discussion of current diabetes management, blood sugar patterns and problems identified  His A1c was recently 7.7 and fructosamine of 346 indicates still inadequate control  Most likely his blood sugar readings are high after meals either from not watching his diet, taking NovoLog consistently or taking enough NovoLog This is difficult to assess because of his checking his blood sugars primarily morning and not any in the last 10 days anyway Does not consistently exercise  Discussed in detail the shortcomings with his blood sugar control and how to improve these He first needs to check his sugars consistently at various times of the day He will adjust his NovoLog further based on his postprandial readings and likely will need 35 units at suppertime Do however need to have him cut back on high carbohydrate high fat meals Needs to walk regularly Continue Toujeo and also oral medications  New diabetic neuropathic symptoms: Discussed use of gabapentin and he will start taking this between 1-3 times a day    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,  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 He has been previously symptomatic with low levels of testosterone Testosterone level is consistently low He cannot afford AndroGel and will continue clomiphene If he is more symptomatic may consider testosterone injections which will be less expensive  Lipids: He needs to start fenofibrate for his high triglycerides and discussed improvement in diet also  Patient Instructions  Check blood sugars on waking up  daily  Also check blood sugars about 2 hours after a meal and do this after different meals by rotation  Recommended blood sugar levels on waking up is 90-130 and about 2 hours after meal is 130-160  Please bring your blood sugar monitor to each visit, thank you  May need > 30 Novolog at supper        Counseling time on subjects  discussed in assessment and plan sections is over 50% of today's 25 minute visit  Reather Littler  05/30/2018 8:33 AM  Note: This office note was prepared with Dragon voice recognition system technology. Any transcriptional errors that result from this process are unintentional.

## 2018-05-30 ENCOUNTER — Ambulatory Visit (INDEPENDENT_AMBULATORY_CARE_PROVIDER_SITE_OTHER): Payer: Medicare Other | Admitting: Endocrinology

## 2018-05-30 ENCOUNTER — Encounter: Payer: Self-pay | Admitting: Endocrinology

## 2018-05-30 VITALS — BP 140/80 | HR 79 | Ht 70.5 in | Wt 252.6 lb

## 2018-05-30 DIAGNOSIS — E1142 Type 2 diabetes mellitus with diabetic polyneuropathy: Secondary | ICD-10-CM | POA: Diagnosis not present

## 2018-05-30 DIAGNOSIS — E1165 Type 2 diabetes mellitus with hyperglycemia: Secondary | ICD-10-CM | POA: Diagnosis not present

## 2018-05-30 DIAGNOSIS — E291 Testicular hypofunction: Secondary | ICD-10-CM | POA: Diagnosis not present

## 2018-05-30 DIAGNOSIS — Z794 Long term (current) use of insulin: Secondary | ICD-10-CM | POA: Diagnosis not present

## 2018-05-30 DIAGNOSIS — E782 Mixed hyperlipidemia: Secondary | ICD-10-CM | POA: Diagnosis not present

## 2018-05-30 MED ORDER — GABAPENTIN 300 MG PO CAPS: 300.0000 mg | ORAL_CAPSULE | Freq: Three times a day (TID) | ORAL | 0 refills | Status: DC

## 2018-05-30 MED ORDER — FENOFIBRATE 145 MG PO TABS: 145.0000 mg | ORAL_TABLET | Freq: Every day | ORAL | 0 refills | Status: DC

## 2018-05-30 NOTE — Patient Instructions (Addendum)
Check blood sugars on waking up  daily  Also check blood sugars about 2 hours after a meal and do this after different meals by rotation  Recommended blood sugar levels on waking up is 90-130 and about 2 hours after meal is 130-160  Please bring your blood sugar monitor to each visit, thank you  May need > 30 Novolog at supper

## 2018-06-14 ENCOUNTER — Other Ambulatory Visit: Payer: Self-pay

## 2018-06-14 ENCOUNTER — Ambulatory Visit (INDEPENDENT_AMBULATORY_CARE_PROVIDER_SITE_OTHER): Payer: PRIVATE HEALTH INSURANCE | Admitting: Family Medicine

## 2018-06-14 ENCOUNTER — Encounter: Payer: Self-pay | Admitting: Family Medicine

## 2018-06-14 VITALS — BP 144/88 | HR 87 | Temp 98.6°F | Ht 71.0 in | Wt 251.2 lb

## 2018-06-14 DIAGNOSIS — I10 Essential (primary) hypertension: Secondary | ICD-10-CM | POA: Diagnosis not present

## 2018-06-14 DIAGNOSIS — IMO0002 Reserved for concepts with insufficient information to code with codable children: Secondary | ICD-10-CM

## 2018-06-14 DIAGNOSIS — Z794 Long term (current) use of insulin: Secondary | ICD-10-CM

## 2018-06-14 DIAGNOSIS — E782 Mixed hyperlipidemia: Secondary | ICD-10-CM

## 2018-06-14 DIAGNOSIS — Z8673 Personal history of transient ischemic attack (TIA), and cerebral infarction without residual deficits: Secondary | ICD-10-CM

## 2018-06-14 DIAGNOSIS — E1165 Type 2 diabetes mellitus with hyperglycemia: Secondary | ICD-10-CM

## 2018-06-14 DIAGNOSIS — Z720 Tobacco use: Secondary | ICD-10-CM

## 2018-06-14 DIAGNOSIS — E11618 Type 2 diabetes mellitus with other diabetic arthropathy: Secondary | ICD-10-CM

## 2018-06-14 DIAGNOSIS — E291 Testicular hypofunction: Secondary | ICD-10-CM

## 2018-06-14 DIAGNOSIS — M7500 Adhesive capsulitis of unspecified shoulder: Secondary | ICD-10-CM

## 2018-06-14 MED ORDER — ATORVASTATIN CALCIUM 10 MG PO TABS: ORAL_TABLET | ORAL | 2 refills | Status: AC

## 2018-06-14 MED ORDER — AMLODIPINE BESYLATE 5 MG PO TABS: 5.0000 mg | ORAL_TABLET | Freq: Every day | ORAL | 0 refills | Status: DC

## 2018-06-14 MED ORDER — CLOPIDOGREL BISULFATE 75 MG PO TABS: 75.0000 mg | ORAL_TABLET | Freq: Every day | ORAL | 2 refills | Status: AC

## 2018-06-14 MED ORDER — METHOCARBAMOL 750 MG PO TABS: 750.0000 mg | ORAL_TABLET | Freq: Every evening | ORAL | 2 refills | Status: AC | PRN

## 2018-06-14 MED ORDER — ATORVASTATIN CALCIUM 10 MG PO TABS: ORAL_TABLET | ORAL | 0 refills | Status: DC

## 2018-06-14 MED ORDER — TRAZODONE HCL 100 MG PO TABS: ORAL_TABLET | ORAL | 0 refills | Status: DC

## 2018-06-14 MED ORDER — METHOCARBAMOL 750 MG PO TABS: 750.0000 mg | ORAL_TABLET | Freq: Every evening | ORAL | 0 refills | Status: DC | PRN

## 2018-06-14 MED ORDER — TRAZODONE HCL 100 MG PO TABS: ORAL_TABLET | ORAL | 2 refills | Status: AC

## 2018-06-14 MED ORDER — CLOPIDOGREL BISULFATE 75 MG PO TABS: 75.0000 mg | ORAL_TABLET | Freq: Every day | ORAL | 0 refills | Status: DC

## 2018-06-14 MED ORDER — AMLODIPINE BESYLATE 5 MG PO TABS: 5.0000 mg | ORAL_TABLET | Freq: Every day | ORAL | 0 refills | Status: AC

## 2018-06-14 NOTE — Patient Instructions (Addendum)
Your blood pressure is to high today though previously it has been under [facility] better control.  We may need to increase your almodipine to 10mg  but I would like you to monitor your blood pressure, drink plenty of water, stick to a low salt diet (see below), and establish asap with your new doc in [place] for BP recheck.  IF you received an x-ray today, you will receive an invoice from [facility]. Please contact [facility] at [phone] with questions or concerns regarding your invoice.   IF you received labwork today, you will receive an invoice from [facility]. Please contact [facility] at [phone] with questions or concerns regarding your invoice.   Our billing staff will not be able to assist you with questions regarding bills from these companies.  You will be contacted with the lab results as soon as they are available. The fastest way to get your results is to activate your My Chart account. Instructions are located on the last page of this paperwork. If you have not heard from [place] regarding the results in 2 weeks, please contact this office.     Managing Your Hypertension Hypertension is commonly called high blood pressure. This is when the force of your blood pressing against the walls of your arteries is too strong. Arteries are blood vessels that carry blood from your heart throughout your body. Hypertension forces the heart to work harder to pump blood, and may cause the arteries to become narrow or stiff. Having untreated or uncontrolled hypertension can cause heart attack, stroke, kidney disease, and other problems. What are blood pressure readings? A blood pressure reading consists of a higher number over a lower number. Ideally, your blood pressure should be below 120/80. The first ("top") number is called the systolic pressure. It is a measure of the pressure in your arteries as your heart beats. The second ("bottom") number is called the diastolic pressure. It is  a measure of the pressure in your arteries as the heart relaxes. What does my blood pressure reading mean? Blood pressure is classified into four stages. Based on your blood pressure reading, your health care provider may use the following stages to determine what type of treatment you need, if any. Systolic pressure and diastolic pressure are measured in a unit called mm Hg. Normal  Systolic pressure: below 120.  Diastolic pressure: below 80. Elevated  Systolic pressure: 120-129.  Diastolic pressure: below 80. Hypertension stage 1  Systolic pressure: 130-139.  Diastolic pressure: 80-89. Hypertension stage 2  Systolic pressure: 140 or above.  Diastolic pressure: 90 or above. What health risks are associated with hypertension? Managing your hypertension is an important responsibility. Uncontrolled hypertension can lead to:  A heart attack.  A stroke.  A weakened blood vessel (aneurysm).  Heart failure.  Kidney damage.  Eye damage.  Metabolic syndrome.  Memory and concentration problems.  What changes can I make to manage my hypertension? Hypertension can be managed by making lifestyle changes and possibly by taking medicines. Your health care provider will help you make a plan to bring your blood pressure within a normal range. Eating and drinking  Eat a diet that is high in fiber and potassium, and low in salt (sodium), added sugar, and fat. An example eating plan is called the DASH (Dietary Approaches to Stop Hypertension) diet. To eat this way: ? Eat plenty of fresh fruits and vegetables. Try to fill half of your plate at each meal with fruits and vegetables. ? Eat whole grains, such  as whole wheat pasta, brown rice, or whole grain bread. Fill about one quarter of your plate with whole grains. ? Eat low-fat diary products. ? Avoid fatty cuts of meat, processed or cured meats, and poultry with skin. Fill about one quarter of your plate with lean proteins such as  fish, chicken without skin, beans, eggs, and tofu. ? Avoid premade and processed foods. These tend to be higher in sodium, added sugar, and fat.  Reduce your daily sodium intake. Most people with hypertension should eat less than 1,500 mg of sodium a day.  Limit alcohol intake to no more than 1 drink a day for nonpregnant women and 2 drinks a day for men. One drink equals 12 oz of beer, 5 oz of wine, or 1 oz of hard liquor. Lifestyle  Work with your health care provider to maintain a healthy body weight, or to lose weight. Ask what an ideal weight is for you.  Get at least 30 minutes of exercise that causes your heart to beat faster (aerobic exercise) most days of the week. Activities may include walking, swimming, or biking.  Include exercise to strengthen your muscles (resistance exercise), such as weight lifting, as part of your weekly exercise routine. Try to do these types of exercises for 30 minutes at least 3 days a week.  Do not use any products that contain nicotine or tobacco, such as cigarettes and e-cigarettes. If you need help quitting, ask your health care provider.  Control any long-term (chronic) conditions you have, such as high cholesterol or diabetes. Monitoring  Monitor your blood pressure at home as told by your health care provider. Your personal target blood pressure may vary depending on your medical conditions, your age, and other factors.  Have your blood pressure checked regularly, as often as told by your health care provider. Working with your health care provider  Review all the medicines you take with your health care provider because there may be side effects or interactions.  Talk with your health care provider about your diet, exercise habits, and other lifestyle factors that may be contributing to hypertension.  Visit your health care provider regularly. Your health care provider can help you create and adjust your plan for managing hypertension. Will  I need medicine to control my blood pressure? Your health care provider may prescribe medicine if lifestyle changes are not enough to get your blood pressure under control, and if:  Your systolic blood pressure is 130 or higher.  Your diastolic blood pressure is 80 or higher.  Take medicines only as told by your health care provider. Follow the directions carefully. Blood pressure medicines must be taken as prescribed. The medicine does not work as well when you skip doses. Skipping doses also puts you at risk for problems. Contact a health care provider if:  You think you are having a reaction to medicines you have taken.  You have repeated (recurrent) headaches.  You feel dizzy.  You have swelling in your ankles.  You have trouble with your vision. Get help right away if:  You develop a severe headache or confusion.  You have unusual weakness or numbness, or you feel faint.  You have severe pain in your chest or abdomen.  You vomit repeatedly.  You have trouble breathing. Summary  Hypertension is when the force of blood pumping through your arteries is too strong. If this condition is not controlled, it may put you at risk for serious complications.  Your personal target blood  pressure may vary depending on your medical conditions, your age, and other factors. For most people, a normal blood pressure is less than 120/80.  Hypertension is managed by lifestyle changes, medicines, or both. Lifestyle changes include weight loss, eating a healthy, low-sodium diet, exercising more, and limiting alcohol. This information is not intended to replace advice given to you by your health care provider. Make sure you discuss any questions you have with your health care provider. Document Released: 07/11/2012 Document Revised: 09/14/2016 Document Reviewed: 09/14/2016 Elsevier Interactive Patient Education  2018 [employer]    DASH Eating Plan DASH stands for "Dietary Approaches to  Stop Hypertension." The DASH eating plan is a healthy eating plan that has been shown to reduce high blood pressure (hypertension). It may also reduce your risk for type 2 diabetes, heart disease, and stroke. The DASH eating plan may also help with weight loss. What are tips for following this plan? General guidelines  Avoid eating more than 2,300 mg (milligrams) of salt (sodium) a day. If you have hypertension, you may need to reduce your sodium intake to 1,500 mg a day.  Limit alcohol intake to no more than 1 drink a day for nonpregnant women and 2 drinks a day for men. One drink equals 12 oz of beer, 5 oz of wine, or 1 oz of hard liquor.  Work with your health care provider to maintain a healthy body weight or to lose weight. Ask what an ideal weight is for you.  Get at least 30 minutes of exercise that causes your heart to beat faster (aerobic exercise) most days of the week. Activities may include walking, swimming, or biking.  Work with your health care provider or diet and nutrition specialist (dietitian) to adjust your eating plan to your individual calorie needs. Reading food labels  Check food labels for the amount of sodium per serving. Choose foods with less than 5 percent of the Daily Value of sodium. Generally, foods with less than 300 mg of sodium per serving fit into this eating plan.  To find whole grains, look for the word "whole" as the first word in the ingredient list. Shopping  Buy products labeled as "low-sodium" or "no salt added."  Buy fresh foods. Avoid canned foods and premade or frozen meals. Cooking  Avoid adding salt when cooking. Use salt-free seasonings or herbs instead of table salt or sea salt. Check with your health care provider or pharmacist before using salt substitutes.  Do not fry foods. Cook foods using healthy methods such as baking, boiling, grilling, and broiling instead.  Cook with heart-healthy oils, such as olive, canola, soybean, or  sunflower oil. Meal planning   Eat a balanced diet that includes: ? 5 or more servings of fruits and vegetables each day. At each meal, try to fill half of your plate with fruits and vegetables. ? Up to 6-8 servings of whole grains each day. ? Less than 6 oz of lean meat, poultry, or fish each day. A 3-oz serving of meat is about the same size as a deck of cards. One egg equals 1 oz. ? 2 servings of low-fat dairy each day. ? A serving of nuts, seeds, or beans 5 times each week. ? Heart-healthy fats. Healthy fats called Omega-3 fatty acids are found in foods such as flaxseeds and coldwater fish, like sardines, salmon, and mackerel.  Limit how much you eat of the following: ? Canned or prepackaged foods. ? Food that is high in trans fat, such  as fried foods. ? Food that is high in saturated fat, such as fatty meat. ? Sweets, desserts, sugary drinks, and other foods with added sugar. ? Full-fat dairy products.  Do not salt foods before eating.  Try to eat at least 2 vegetarian meals each week.  Eat more home-cooked food and less restaurant, buffet, and fast food.  When eating at a restaurant, ask that your food be prepared with less salt or no salt, if possible. What foods are recommended? The items listed may not be a complete list. Talk with your dietitian about what dietary choices are best for you. Grains Whole-grain or whole-wheat bread. Whole-grain or whole-wheat pasta. Brown rice. Orpah Cobbatmeal. Quinoa. Bulgur. Whole-grain and low-sodium cereals. Pita bread. Low-fat, low-sodium crackers. Whole-wheat flour tortillas. Vegetables Fresh or frozen vegetables (raw, steamed, roasted, or grilled). Low-sodium or reduced-sodium tomato and vegetable juice. Low-sodium or reduced-sodium tomato sauce and tomato paste. Low-sodium or reduced-sodium canned vegetables. Fruits All fresh, dried, or frozen fruit. Canned fruit in natural juice (without added sugar). Meat and other protein foods Skinless  chicken or [place]key. Ground chicken or [place]key. Pork with fat trimmed off. Fish and seafood. Egg whites. Dried beans, peas, or lentils. Unsalted nuts, nut butters, and seeds. Unsalted canned beans. Lean cuts of beef with fat trimmed off. Low-sodium, lean deli meat. Dairy Low-fat (1%) or fat-free (skim) milk. Fat-free, low-fat, or reduced-fat cheeses. Nonfat, low-sodium ricotta or cottage cheese. Low-fat or nonfat yogurt. Low-fat, low-sodium cheese. Fats and oils Soft margarine without trans fats. Vegetable oil. Low-fat, reduced-fat, or light mayonnaise and salad dressings (reduced-sodium). Canola, safflower, olive, soybean, and sunflower oils. Avocado. Seasoning and other foods Herbs. Spices. Seasoning mixes without salt. Unsalted popcorn and pretzels. Fat-free sweets. What foods are not recommended? The items listed may not be a complete list. Talk with your dietitian about what dietary choices are best for you. Grains Baked goods made with fat, such as croissants, muffins, or some breads. Dry pasta or rice meal packs. Vegetables Creamed or fried vegetables. Vegetables in a cheese sauce. Regular canned vegetables (not low-sodium or reduced-sodium). Regular canned tomato sauce and paste (not low-sodium or reduced-sodium). Regular tomato and vegetable juice (not low-sodium or reduced-sodium). Rosita FirePickles. Olives. Fruits Canned fruit in a light or heavy syrup. Fried fruit. Fruit in cream or butter sauce. Meat and other protein foods Fatty cuts of meat. Ribs. Fried meat. Tomasa BlaseBacon. Sausage. Bologna and other processed lunch meats. Salami. Fatback. Hotdogs. Bratwurst. Salted nuts and seeds. Canned beans with added salt. Canned or smoked fish. Whole eggs or egg yolks. Chicken or [place]key with skin. Dairy Whole or 2% milk, cream, and half-and-half. Whole or full-fat cream cheese. Whole-fat or sweetened yogurt. Full-fat cheese. Nondairy creamers. Whipped toppings. Processed cheese and cheese spreads. Fats and  oils Butter. Stick margarine. Lard. Shortening. Ghee. Bacon fat. Tropical oils, such as coconut, palm kernel, or palm oil. Seasoning and other foods Salted popcorn and pretzels. Onion salt, garlic salt, seasoned salt, table salt, and sea salt. Worcestershire sauce. Tartar sauce. Barbecue sauce. Teriyaki sauce. Soy sauce, including reduced-sodium. Steak sauce. Canned and packaged gravies. Fish sauce. Oyster sauce. Cocktail sauce. Horseradish that you find on the shelf. Ketchup. Mustard. Meat flavorings and tenderizers. Bouillon cubes. Hot sauce and Tabasco sauce. Premade or packaged marinades. Premade or packaged taco seasonings. Relishes. Regular salad dressings. Where to find more information:  National Heart, Lung, and Blood Institute: PopSteam.iswww.nhlbi.nih.gov  American Heart Association: www.heart.org Summary  The DASH eating plan is a healthy eating plan that has been shown  to reduce high blood pressure (hypertension). It may also reduce your risk for type 2 diabetes, heart disease, and stroke.  With the DASH eating plan, you should limit salt (sodium) intake to 2,300 mg a day. If you have hypertension, you may need to reduce your sodium intake to 1,500 mg a day.  When on the DASH eating plan, aim to eat more fresh fruits and vegetables, whole grains, lean proteins, low-fat dairy, and heart-healthy fats.  Work with your health care provider or diet and nutrition specialist (dietitian) to adjust your eating plan to your individual calorie needs. This information is not intended to replace advice given to you by your health care provider. Make sure you discuss any questions you have with your health care provider. Document Released: 10/06/2011 Document Revised: 10/10/2016 Document Reviewed: 10/10/2016 Elsevier Interactive Patient Education  [employer].

## 2018-06-14 NOTE — Progress Notes (Signed)
Subjective:    Patient ID: Drew Salinas, male    DOB: Dec 03, 1950, 67 y.o.   MRN: [id number] Chief Complaint  Patient presents with  . Medication Refill    [place], needs 3 month supplies until he is able to find provider    HPI  Drew Salinas is here today to f/u on chronic medical conditions and med refills. I last saw him 10 mos ago Moving to [place], [place] comes in 2 weeks. They have found a house up there - still under inspection so has a lot of things to clear up, go away, things on plate.  Type 2 IDDM follows closely with Dr. Dwyane Dee - last VO 05/30/18 Lab Results  Component Value Date   HGBA1C 7.7 (H) 03/23/2018   HGBA1C 6.7 (H) 12/25/2017   HGBA1C 6.7 (H) 09/25/2017   HGBA1C 6.4 06/16/2017   HGBA1C 6.7 (H) 03/17/2017  Last microalb mildly elev 09/25/2017 Baseline Cr 1.09-1.16 w/ eGFR 67-72  Last lipids: LDL, direct 05/25/18 39  Male hypogonadism: Last PSA 09/10/17 Lab Results  Component Value Date   TESTOSTERONE 193.38 (L) 05/25/2018   TESTOSTERONE 169.05 (L) 03/23/2018   TESTOSTERONE 219.54 (L) 12/25/2017   TESTOSTERONE 272.2 09/25/2017   TESTOSTERONE 235.82 (L) 06/16/2017   Patient Active Problem List   Diagnosis Date Noted  . De Quervain's tenosynovitis, right 10/19/2015  . 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 Fracture of scaphoid of right wrist with delayed healing 09/03/2015  . Diabetic peripheral neuropathy associated with type 2 diabetes mellitus ([place]) 05/22/2015  . Essential hypertension, benign 05/21/2015  . Erectile dysfunction 10/02/2013  . Hypogonadism male 10/02/2013  . Insulin dependent type 2 diabetes mellitus, uncontrolled ([place]) 05/23/2012  . Hyperlipidemia 05/23/2012  . Obesity, Class II, BMI 35-39.9, with comorbidity 05/23/2012  . Depression 05/23/2012  . Tobacco user 05/23/2012  . History of stroke without residual deficits 05/23/2012    Past Medical History:  Diagnosis  Date  . Allergy   . Diabetes mellitus 1995  . GERD (gastroesophageal reflux disease)   . Hyperlipidemia   . Hypertension   . Sleep apnea   . Stroke [facility]) 1993   No residual deficits   Past Surgical History:  Procedure Laterality Date  . APPENDECTOMY     Current Outpatient Medications on File Prior to Visit  Medication Sig Dispense Refill  . BD INSULIN SYRINGE U/F 31G X 5/16" 1 ML MISC USE AS DIRECTED 4-6 TIMES DAILY. 600 each 1  . cholecalciferol (VITAMIN D) 1000 units tablet Take 1,000 Units by mouth daily.    . clomiPHENE (CLOMID) 50 MG tablet Take 1 tablet (50 mg total) by mouth daily. Take one half tablet by mouth every other day. 30 tablet 3  . Continuous Blood Gluc Receiver (FREESTYLE LIBRE READER) DEVI 1 Device by Does not apply route as directed. 1 Device 0  . Continuous Blood Gluc Sensor (FREESTYLE LIBRE 14 DAY SENSOR) MISC 1 each by Does not apply route every 14 (fourteen) days. 2 each 1  . empagliflozin (JARDIANCE) 25 MG TABS tablet Take 25 mg by mouth daily. 90 tablet 2  . fenofibrate (TRICOR) 145 MG tablet Take 1 tablet (145 mg total) by mouth daily. 90 tablet 0  . gabapentin (NEURONTIN) 300 MG capsule Take 1 capsule (300 mg total) by mouth 3 (three) times daily. 270 capsule 0  . glucosamine-chondroitin 500-400 MG tablet Take 1 tablet by mouth 3 (three) times daily.    Marland Kitchen  Insulin Glargine (TOUJEO MAX SOLOSTAR) 300 UNIT/ML SOPN Inject 150 Units into the skin daily. 8 pen 3  . Insulin Pen Needle (BD PEN NEEDLE NANO U/F) 32G X 4 MM MISC USE 3 PER DAY TO INJECT INSULIN 100 each 2  . NOVOLOG 100 UNIT/ML injection INJECT 30 TO 40 UNITS SUB CUTANEOUSLY 3 TIMES DAILY WITH MEALS PER SLIDING SCALE 30 mL 1  . sitaGLIPtin-metformin (JANUMET) 50-1000 MG tablet Take 1 tablet by mouth 2 (two) times daily. 180 tablet 2   No current facility-administered medications on file prior to visit.    Allergies  Allergen Reactions  . Lisinopril Cough   Family History  Problem Relation Age of  Onset  . Diabetes Mother   . Diabetes Brother    Social History   Socioeconomic History  . Marital status: Married    Spouse name: Not on file  . Number of children: 3  . Years of education: Not on file  . Highest education level: Not on file  Occupational History  . Occupation: Psychologist, counselling: [employer]  . Financial resource strain: Not on file  . Food insecurity:    Worry: Not on file    Inability: Not on file  . Transportation needs:    Medical: Not on file    Non-medical: Not on file  Tobacco Use  . Smoking status: Former Smoker    Packs/day: 0.75    Years: 49.00    Pack years: 36.75    Types: Cigarettes    Last attempt to quit: 03/2017    Years since quitting: 1.2  . Smokeless tobacco: Never Used  . Tobacco comment: quit on 07/05/2013  Substance and Sexual Activity  . Alcohol use: Yes    Alcohol/week: 2.0 standard drinks    Types: 2 Cans of beer per week  . Drug use: No  . Sexual activity: Yes  Lifestyle  . Physical activity:    Days per week: Not on file    Minutes per session: Not on file  . Stress: Not on file  Relationships  . Social connections:    Talks on phone: Not on file    Gets together: Not on file    Attends religious service: Not on file    Active member of club or organization: Not on file    Attends meetings of clubs or organizations: Not on file    Relationship status: Not on file  Other Topics Concern  . Not on file  Social History Narrative  . Not on file   Depression screen [facility] 2/9 06/14/2018 08/10/2017 01/13/2017 10/06/2016 03/31/2016  Decreased Interest 0 0 0 0 0  Down, Depressed, Hopeless 0 0 0 0 0  PHQ - 2 Score 0 0 0 0 0      Review of Systems  Constitutional: Negative for chills and fever.  Eyes: Negative for visual disturbance.  Respiratory: Negative for shortness of breath.   Cardiovascular: Negative for chest pain and leg swelling.  Neurological: Negative for dizziness, syncope, facial  asymmetry, weakness, light-headedness and headaches.       Objective:   Physical Exam  Constitutional: He is oriented to person, place, and time. He appears well-developed and well-nourished. No distress.  HENT:  Head: Normocephalic and atraumatic.  Eyes: Pupils are equal, round, and reactive to light. Conjunctivae are normal. No scleral icterus.  Neck: Normal range of motion. Neck supple. No thyromegaly present.  Cardiovascular: Normal rate, regular rhythm, normal heart sounds  and intact distal pulses.  Pulmonary/Chest: Effort normal and breath sounds normal. No respiratory distress.  Musculoskeletal: He exhibits no edema.  Lymphadenopathy:    He has no cervical adenopathy.  Neurological: He is alert and oriented to person, place, and time.  Skin: Skin is warm and dry. He is not diaphoretic.  Psychiatric: He has a normal mood and affect. His behavior is normal.    Diabetic Foot Exam - Simple   Simple Foot Form Visual Inspection No deformities, no ulcerations, no other skin breakdown bilaterally:  Yes Sensation Testing Intact to touch and monofilament testing bilaterally:  Yes Pulse Check Comments Felt all prick with filament on both feet.  Although, Still having numbing on both feet       BP (!) 151/84 (BP Location: Left Arm, Patient Position: Sitting, Cuff Size: Normal)   Pulse 87   Temp 98.6 F (37 C) (Oral)   Ht _0  (1.803 m)   Wt 251 lb 3.2 oz (113.9 kg)   SpO2 97%   BMI 35.04 kg/m     Recheck BP 144/88 Assessment & Plan:  Pt moving to [place] - has already found new dr though not sched appt to establish and doesn't have info to sign record release yet. Gave refills on chronic meds, no changes, to see pt through transition.  1. Essential hypertension, benign - monitor BP closely outside of office and may need ot icnrease amlodipine from 5 to 10 if 568L-275T systolic.  2. 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 Mixed  hyperlipidemia   4. Tobacco user   5. History of stroke without residual deficits   6. Insulin dependent type 2 diabetes mellitus, uncontrolled ([place])   7. Hypogonadism male     Orders Placed This Encounter  Procedures  . Care order/instruction:    Scheduling Instructions:     Recheck BP    Meds ordered this encounter  Medications  . DISCONTD: amLODipine (NORVASC) 5 MG tablet    Sig: Take 1 tablet (5 mg total) by mouth daily.    Dispense:  90 tablet    Refill:  0  . DISCONTD: atorvastatin (LIPITOR) 10 MG tablet    Sig: TAKE 1 TABLET BY MOUTH DAILY AT 6PM    Dispense:  90 tablet    Refill:  0  . DISCONTD: clopidogrel (PLAVIX) 75 MG tablet    Sig: Take 1 tablet (75 mg total) by mouth daily.    Dispense:  90 tablet    Refill:  0  . DISCONTD: traZODone (DESYREL) 100 MG tablet    Sig: TAKE 1 TABLET BY MOUTH AT BEDTIME.    Dispense:  90 tablet    Refill:  0  . DISCONTD: methocarbamol (ROBAXIN) 750 MG tablet    Sig: Take 1-2 tablets (750-1,500 mg total) by mouth at bedtime as needed for muscle spasms.    Dispense:  180 tablet    Refill:  0  . amLODipine (NORVASC) 5 MG tablet    Sig: Take 1 tablet (5 mg total) by mouth daily.    Dispense:  90 tablet    Refill:  0  . atorvastatin (LIPITOR) 10 MG tablet    Sig: TAKE 1 TABLET BY MOUTH DAILY AT 6PM    Dispense:  90 tablet    Refill:  2  . clopidogrel (PLAVIX) 75 MG tablet    Sig: Take 1 tablet (75 mg total) by mouth daily.    Dispense:  90 tablet  Refill:  2  . methocarbamol (ROBAXIN) 750 MG tablet    Sig: Take 1-2 tablets (750-1,500 mg total) by mouth at bedtime as needed for muscle spasms.    Dispense:  180 tablet    Refill:  2  . traZODone (DESYREL) 100 MG tablet    Sig: TAKE 1 TABLET BY MOUTH AT BEDTIME.    Dispense:  90 tablet    Refill:  2     Delman Cheadle, M.D.  Primary Care at [facility] [address] [place], [place] [address] [phone] phone [phone] fax  06/14/18 1:55 PM

## 2018-06-15 ENCOUNTER — Telehealth: Payer: Self-pay | Admitting: Endocrinology

## 2018-06-15 NOTE — Telephone Encounter (Signed)
Patients wife stated that patient is using the 14 day Libra and received the sensors but did not receive the reader Patient needs this sent into the pharmacy    [id number] - [place] [place] - [address] AT [facility] [address] & [address]

## 2018-06-18 ENCOUNTER — Ambulatory Visit: Payer: Self-pay | Admitting: Family Medicine

## 2018-06-19 ENCOUNTER — Other Ambulatory Visit: Payer: Self-pay

## 2018-06-19 ENCOUNTER — Telehealth: Payer: Self-pay | Admitting: Endocrinology

## 2018-06-19 MED ORDER — FREESTYLE LIBRE READER DEVI: 1.0000 | 0 refills | Status: AC

## 2018-06-19 NOTE — Telephone Encounter (Signed)
I have sent to [facility] for patient.  

## 2018-06-19 NOTE — Telephone Encounter (Signed)
Patient did not receive the FreeStyle 14 Day Meter. [facility]. [facility] [address]

## 2018-06-19 NOTE — Telephone Encounter (Signed)
I have sent to [facility] for patient.

## 2018-08-24 ENCOUNTER — Other Ambulatory Visit: Payer: Self-pay | Admitting: Endocrinology

## 2018-09-03 ENCOUNTER — Telehealth: Payer: Self-pay | Admitting: Acute Care

## 2018-09-03 NOTE — Telephone Encounter (Signed)
Called patient unable to reach left message to give us a call back.

## 2018-09-04 NOTE — Telephone Encounter (Signed)
lmtcb x2 for pt. 

## 2018-09-05 NOTE — Telephone Encounter (Signed)
CT has been canceled. Attempted to contact pt. I did not receive an answer. I have left a message for pt to return our call.

## 2018-09-06 NOTE — Telephone Encounter (Signed)
We have attempted to contact pt several times with no success or call back from pt. Per triage protocol, message will be closed.  

## 2018-09-14 ENCOUNTER — Inpatient Hospital Stay: Admission: RE | Admit: 2018-09-14 | Payer: Medicare Other | Source: Ambulatory Visit

## 2018-09-19 ENCOUNTER — Encounter (INDEPENDENT_AMBULATORY_CARE_PROVIDER_SITE_OTHER): Payer: PRIVATE HEALTH INSURANCE | Admitting: Ophthalmology

## 2018-10-25 ENCOUNTER — Other Ambulatory Visit: Payer: Self-pay | Admitting: Endocrinology

## 2018-12-26 ENCOUNTER — Other Ambulatory Visit: Payer: Self-pay | Admitting: Endocrinology

## 2018-12-28 ENCOUNTER — Other Ambulatory Visit: Payer: Self-pay | Admitting: Endocrinology

## 2019-01-27 ENCOUNTER — Other Ambulatory Visit: Payer: Self-pay | Admitting: Family Medicine

## 2019-06-05 ENCOUNTER — Other Ambulatory Visit: Payer: Self-pay | Admitting: Endocrinology

## 2019-11-08 ENCOUNTER — Other Ambulatory Visit: Payer: Self-pay | Admitting: Endocrinology
